# Patient Record
Sex: Male | Born: 1950
Health system: Southern US, Community
[De-identification: ages and names within clinical notes are randomized; demographics above are authoritative.]

## PROBLEM LIST (undated history)

## (undated) DIAGNOSIS — F410 Panic disorder [episodic paroxysmal anxiety] without agoraphobia: Secondary | ICD-10-CM

## (undated) DIAGNOSIS — F1010 Alcohol abuse, uncomplicated: Secondary | ICD-10-CM

## (undated) DIAGNOSIS — J441 Chronic obstructive pulmonary disease with (acute) exacerbation: Secondary | ICD-10-CM

## (undated) DIAGNOSIS — I219 Acute myocardial infarction, unspecified: Secondary | ICD-10-CM

## (undated) DIAGNOSIS — Z720 Tobacco use: Secondary | ICD-10-CM

## (undated) DIAGNOSIS — I10 Essential (primary) hypertension: Secondary | ICD-10-CM

## (undated) DIAGNOSIS — E785 Hyperlipidemia, unspecified: Secondary | ICD-10-CM

## (undated) DIAGNOSIS — I2510 Atherosclerotic heart disease of native coronary artery without angina pectoris: Secondary | ICD-10-CM

## (undated) DIAGNOSIS — I739 Peripheral vascular disease, unspecified: Secondary | ICD-10-CM

## (undated) HISTORY — PX: TONSILLECTOMY: SUR1361

---

## 2006-03-12 ENCOUNTER — Ambulatory Visit: Payer: Self-pay | Admitting: Cardiology

## 2006-03-12 ENCOUNTER — Encounter: Payer: Self-pay | Admitting: Emergency Medicine

## 2006-03-12 ENCOUNTER — Inpatient Hospital Stay (HOSPITAL_COMMUNITY): Admission: AD | Admit: 2006-03-12 | Discharge: 2006-03-16 | Payer: Self-pay | Admitting: Cardiology

## 2006-04-10 ENCOUNTER — Ambulatory Visit: Payer: Self-pay | Admitting: Cardiology

## 2006-09-17 DIAGNOSIS — I219 Acute myocardial infarction, unspecified: Secondary | ICD-10-CM

## 2006-09-17 HISTORY — DX: Acute myocardial infarction, unspecified: I21.9

## 2006-09-17 HISTORY — PX: CORONARY ANGIOPLASTY WITH STENT PLACEMENT: SHX49

## 2015-03-23 ENCOUNTER — Encounter (HOSPITAL_COMMUNITY): Payer: Self-pay | Admitting: Cardiology

## 2015-03-23 ENCOUNTER — Inpatient Hospital Stay (HOSPITAL_COMMUNITY)
Admission: EM | Admit: 2015-03-23 | Discharge: 2015-03-27 | DRG: 981 | Disposition: A | Discharge status: Home / Self Care | Payer: Self-pay | Attending: Family Medicine | Admitting: Family Medicine

## 2015-03-23 ENCOUNTER — Emergency Department (HOSPITAL_COMMUNITY): Payer: Self-pay

## 2015-03-23 ENCOUNTER — Inpatient Hospital Stay (HOSPITAL_COMMUNITY): Payer: Self-pay

## 2015-03-23 DIAGNOSIS — I712 Thoracic aortic aneurysm, without rupture: Secondary | ICD-10-CM | POA: Diagnosis present

## 2015-03-23 DIAGNOSIS — E785 Hyperlipidemia, unspecified: Secondary | ICD-10-CM | POA: Diagnosis present

## 2015-03-23 DIAGNOSIS — Z87891 Personal history of nicotine dependence: Secondary | ICD-10-CM

## 2015-03-23 DIAGNOSIS — R778 Other specified abnormalities of plasma proteins: Secondary | ICD-10-CM | POA: Insufficient documentation

## 2015-03-23 DIAGNOSIS — F17210 Nicotine dependence, cigarettes, uncomplicated: Secondary | ICD-10-CM | POA: Diagnosis present

## 2015-03-23 DIAGNOSIS — N189 Chronic kidney disease, unspecified: Secondary | ICD-10-CM | POA: Diagnosis present

## 2015-03-23 DIAGNOSIS — I480 Paroxysmal atrial fibrillation: Secondary | ICD-10-CM | POA: Diagnosis not present

## 2015-03-23 DIAGNOSIS — I249 Acute ischemic heart disease, unspecified: Secondary | ICD-10-CM

## 2015-03-23 DIAGNOSIS — R7989 Other specified abnormal findings of blood chemistry: Secondary | ICD-10-CM

## 2015-03-23 DIAGNOSIS — R911 Solitary pulmonary nodule: Secondary | ICD-10-CM

## 2015-03-23 DIAGNOSIS — R739 Hyperglycemia, unspecified: Secondary | ICD-10-CM | POA: Diagnosis present

## 2015-03-23 DIAGNOSIS — Z955 Presence of coronary angioplasty implant and graft: Secondary | ICD-10-CM

## 2015-03-23 DIAGNOSIS — Z9861 Coronary angioplasty status: Secondary | ICD-10-CM

## 2015-03-23 DIAGNOSIS — J189 Pneumonia, unspecified organism: Secondary | ICD-10-CM

## 2015-03-23 DIAGNOSIS — R0602 Shortness of breath: Secondary | ICD-10-CM | POA: Insufficient documentation

## 2015-03-23 DIAGNOSIS — R0902 Hypoxemia: Secondary | ICD-10-CM | POA: Insufficient documentation

## 2015-03-23 DIAGNOSIS — J9601 Acute respiratory failure with hypoxia: Secondary | ICD-10-CM | POA: Diagnosis present

## 2015-03-23 DIAGNOSIS — T380X5A Adverse effect of glucocorticoids and synthetic analogues, initial encounter: Secondary | ICD-10-CM | POA: Diagnosis present

## 2015-03-23 DIAGNOSIS — F1010 Alcohol abuse, uncomplicated: Secondary | ICD-10-CM

## 2015-03-23 DIAGNOSIS — I214 Non-ST elevation (NSTEMI) myocardial infarction: Secondary | ICD-10-CM

## 2015-03-23 DIAGNOSIS — J441 Chronic obstructive pulmonary disease with (acute) exacerbation: Principal | ICD-10-CM

## 2015-03-23 DIAGNOSIS — Z720 Tobacco use: Secondary | ICD-10-CM

## 2015-03-23 DIAGNOSIS — I2510 Atherosclerotic heart disease of native coronary artery without angina pectoris: Secondary | ICD-10-CM

## 2015-03-23 DIAGNOSIS — I129 Hypertensive chronic kidney disease with stage 1 through stage 4 chronic kidney disease, or unspecified chronic kidney disease: Secondary | ICD-10-CM | POA: Diagnosis present

## 2015-03-23 DIAGNOSIS — N179 Acute kidney failure, unspecified: Secondary | ICD-10-CM | POA: Diagnosis present

## 2015-03-23 DIAGNOSIS — D3502 Benign neoplasm of left adrenal gland: Secondary | ICD-10-CM | POA: Diagnosis present

## 2015-03-23 DIAGNOSIS — F1020 Alcohol dependence, uncomplicated: Secondary | ICD-10-CM | POA: Diagnosis present

## 2015-03-23 DIAGNOSIS — R079 Chest pain, unspecified: Secondary | ICD-10-CM

## 2015-03-23 HISTORY — DX: Hyperlipidemia, unspecified: E78.5

## 2015-03-23 HISTORY — DX: Atherosclerotic heart disease of native coronary artery without angina pectoris: I25.10

## 2015-03-23 HISTORY — DX: Panic disorder (episodic paroxysmal anxiety): F41.0

## 2015-03-23 HISTORY — DX: Tobacco use: Z72.0

## 2015-03-23 HISTORY — DX: Essential (primary) hypertension: I10

## 2015-03-23 HISTORY — DX: Alcohol abuse, uncomplicated: F10.10

## 2015-03-23 HISTORY — DX: Chronic obstructive pulmonary disease with (acute) exacerbation: J44.1

## 2015-03-23 HISTORY — DX: Acute myocardial infarction, unspecified: I21.9

## 2015-03-23 LAB — GLUCOSE, CAPILLARY
Glucose-Capillary: 142 mg/dL — ABNORMAL HIGH (ref 65–99)
Glucose-Capillary: 159 mg/dL — ABNORMAL HIGH (ref 65–99)

## 2015-03-23 LAB — BASIC METABOLIC PANEL
ANION GAP: 13 (ref 5–15)
BUN: 19 mg/dL (ref 6–20)
CALCIUM: 9.6 mg/dL (ref 8.9–10.3)
CO2: 32 mmol/L (ref 22–32)
CREATININE: 1.56 mg/dL — AB (ref 0.61–1.24)
Chloride: 92 mmol/L — ABNORMAL LOW (ref 101–111)
GFR calc non Af Amer: 46 mL/min — ABNORMAL LOW (ref >60)
GFR, EST AFRICAN AMERICAN: 53 mL/min — AB (ref >60)
Glucose, Bld: 163 mg/dL — ABNORMAL HIGH (ref 65–99)
POTASSIUM: 4.2 mmol/L (ref 3.5–5.1)
Sodium: 137 mmol/L (ref 135–145)

## 2015-03-23 LAB — LIPID PANEL
Cholesterol: 142 mg/dL (ref 0–200)
HDL: 32 mg/dL — ABNORMAL LOW (ref >40)
LDL Cholesterol: 87 mg/dL (ref 0–99)
Total CHOL/HDL Ratio: 4.4 RATIO
Triglycerides: 114 mg/dL (ref <150)
VLDL: 23 mg/dL (ref 0–40)

## 2015-03-23 LAB — HEPATIC FUNCTION PANEL
ALBUMIN: 3.1 g/dL — AB (ref 3.5–5.0)
ALT: 20 U/L (ref 17–63)
AST: 23 U/L (ref 15–41)
Alkaline Phosphatase: 75 U/L (ref 38–126)
BILIRUBIN TOTAL: 0.6 mg/dL (ref 0.3–1.2)
Bilirubin, Direct: 0.2 mg/dL (ref 0.1–0.5)
Indirect Bilirubin: 0.4 mg/dL (ref 0.3–0.9)
Total Protein: 7.7 g/dL (ref 6.5–8.1)

## 2015-03-23 LAB — I-STAT TROPONIN, ED: TROPONIN I, POC: 0.55 ng/mL — AB (ref 0.00–0.08)

## 2015-03-23 LAB — TSH: TSH: 0.928 u[IU]/mL (ref 0.350–4.500)

## 2015-03-23 LAB — CBC
HEMATOCRIT: 48 % (ref 39.0–52.0)
HEMOGLOBIN: 15.7 g/dL (ref 13.0–17.0)
MCH: 33.7 pg (ref 26.0–34.0)
MCHC: 32.7 g/dL (ref 30.0–36.0)
MCV: 103 fL — AB (ref 78.0–100.0)
Platelets: 265 10*3/uL (ref 150–400)
RBC: 4.66 MIL/uL (ref 4.22–5.81)
RDW: 13 % (ref 11.5–15.5)
WBC: 13.8 10*3/uL — ABNORMAL HIGH (ref 4.0–10.5)

## 2015-03-23 LAB — TROPONIN I
Troponin I: 0.52 ng/mL (ref <0.031)
Troponin I: 0.43 ng/mL — ABNORMAL HIGH (ref <0.031)

## 2015-03-23 LAB — HEPARIN LEVEL (UNFRACTIONATED): Heparin Unfractionated: 0.21 IU/mL — ABNORMAL LOW (ref 0.30–0.70)

## 2015-03-23 LAB — T4, FREE: Free T4: 1.37 ng/dL — ABNORMAL HIGH (ref 0.61–1.12)

## 2015-03-23 LAB — BRAIN NATRIURETIC PEPTIDE: B NATRIURETIC PEPTIDE 5: 495.2 pg/mL — AB (ref 0.0–100.0)

## 2015-03-23 MED ORDER — ONDANSETRON HCL 4 MG PO TABS: 4.0000 mg | ORAL_TABLET | Freq: Four times a day (QID) | ORAL | Status: DC | PRN

## 2015-03-23 MED ORDER — ASPIRIN 325 MG PO TABS
325.0000 mg | ORAL_TABLET | Freq: Every day | ORAL | Status: DC
  Administered 2015-03-24: 325 mg via ORAL
  Filled 2015-03-23: qty 1

## 2015-03-23 MED ORDER — ADULT MULTIVITAMIN W/MINERALS CH
1.0000 | ORAL_TABLET | Freq: Every day | ORAL | Status: DC
  Administered 2015-03-23 – 2015-03-27 (×5): 1 via ORAL
  Filled 2015-03-23 (×5): qty 1

## 2015-03-23 MED ORDER — HEPARIN (PORCINE) IN NACL 100-0.45 UNIT/ML-% IJ SOLN
1500.0000 [IU]/h | INTRAMUSCULAR | Status: DC
  Administered 2015-03-23: 1100 [IU]/h via INTRAVENOUS
  Administered 2015-03-24: 1500 [IU]/h via INTRAVENOUS
  Administered 2015-03-24: 1400 [IU]/h via INTRAVENOUS
  Filled 2015-03-23 (×3): qty 250

## 2015-03-23 MED ORDER — FUROSEMIDE 10 MG/ML IJ SOLN
40.0000 mg | Freq: Once | INTRAMUSCULAR | Status: DC
  Filled 2015-03-23: qty 4

## 2015-03-23 MED ORDER — ENOXAPARIN SODIUM 40 MG/0.4ML ~~LOC~~ SOLN: 40.0000 mg | SUBCUTANEOUS | Status: DC

## 2015-03-23 MED ORDER — METHYLPREDNISOLONE SODIUM SUCC 125 MG IJ SOLR
125.0000 mg | Freq: Once | INTRAMUSCULAR | Status: AC
  Administered 2015-03-23: 125 mg via INTRAVENOUS
  Filled 2015-03-23: qty 2

## 2015-03-23 MED ORDER — ASPIRIN EC 81 MG PO TBEC
81.0000 mg | DELAYED_RELEASE_TABLET | Freq: Every day | ORAL | Status: DC
Start: 2015-03-24 — End: 2015-03-23

## 2015-03-23 MED ORDER — LORAZEPAM 2 MG/ML IJ SOLN: 1.0000 mg | Freq: Four times a day (QID) | INTRAMUSCULAR | Status: DC | PRN

## 2015-03-23 MED ORDER — FOLIC ACID 1 MG PO TABS
1.0000 mg | ORAL_TABLET | Freq: Every day | ORAL | Status: DC
  Administered 2015-03-23 – 2015-03-27 (×5): 1 mg via ORAL
  Filled 2015-03-23 (×5): qty 1

## 2015-03-23 MED ORDER — ACETAMINOPHEN 325 MG PO TABS: 650.0000 mg | ORAL_TABLET | Freq: Four times a day (QID) | ORAL | Status: DC | PRN

## 2015-03-23 MED ORDER — LORAZEPAM 1 MG PO TABS
1.0000 mg | ORAL_TABLET | Freq: Four times a day (QID) | ORAL | Status: DC | PRN
  Administered 2015-03-24 (×2): 1 mg via ORAL
  Filled 2015-03-23 (×2): qty 1

## 2015-03-23 MED ORDER — HEPARIN BOLUS VIA INFUSION
4000.0000 [IU] | Freq: Once | INTRAVENOUS | Status: AC
  Administered 2015-03-23: 4000 [IU] via INTRAVENOUS
  Filled 2015-03-23: qty 4000

## 2015-03-23 MED ORDER — INSULIN ASPART 100 UNIT/ML ~~LOC~~ SOLN
0.0000 [IU] | Freq: Three times a day (TID) | SUBCUTANEOUS | Status: DC
  Administered 2015-03-23: 1 [IU] via SUBCUTANEOUS
  Administered 2015-03-24: 2 [IU] via SUBCUTANEOUS
  Administered 2015-03-24: 1 [IU] via SUBCUTANEOUS
  Administered 2015-03-25: 2 [IU] via SUBCUTANEOUS
  Administered 2015-03-26: 1 [IU] via SUBCUTANEOUS
  Administered 2015-03-26 (×2): 2 [IU] via SUBCUTANEOUS
  Administered 2015-03-27: 1 [IU] via SUBCUTANEOUS

## 2015-03-23 MED ORDER — NICOTINE 21 MG/24HR TD PT24
21.0000 mg | MEDICATED_PATCH | Freq: Every day | TRANSDERMAL | Status: DC
  Administered 2015-03-23 – 2015-03-27 (×5): 21 mg via TRANSDERMAL
  Filled 2015-03-23 (×5): qty 1

## 2015-03-23 MED ORDER — FAMOTIDINE 20 MG PO TABS
20.0000 mg | ORAL_TABLET | Freq: Two times a day (BID) | ORAL | Status: DC | PRN
  Administered 2015-03-23 – 2015-03-25 (×2): 20 mg via ORAL
  Filled 2015-03-23 (×3): qty 1

## 2015-03-23 MED ORDER — LEVOFLOXACIN 500 MG PO TABS
500.0000 mg | ORAL_TABLET | Freq: Every day | ORAL | Status: DC
  Administered 2015-03-23: 500 mg via ORAL
  Filled 2015-03-23 (×2): qty 1

## 2015-03-23 MED ORDER — SODIUM CHLORIDE 0.9 % IV SOLN
INTRAVENOUS | Status: DC
  Administered 2015-03-23 (×2): via INTRAVENOUS
  Filled 2015-03-23: qty 1000

## 2015-03-23 MED ORDER — SODIUM CHLORIDE 0.9 % IJ SOLN
3.0000 mL | Freq: Two times a day (BID) | INTRAMUSCULAR | Status: DC
  Administered 2015-03-25: 22:00:00 3 mL via INTRAVENOUS

## 2015-03-23 MED ORDER — ACETAMINOPHEN 650 MG RE SUPP: 650.0000 mg | Freq: Four times a day (QID) | RECTAL | Status: DC | PRN

## 2015-03-23 MED ORDER — ASPIRIN 81 MG PO CHEW
324.0000 mg | CHEWABLE_TABLET | Freq: Once | ORAL | Status: AC
  Administered 2015-03-23: 324 mg via ORAL
  Filled 2015-03-23: qty 4

## 2015-03-23 MED ORDER — CETYLPYRIDINIUM CHLORIDE 0.05 % MT LIQD
7.0000 mL | Freq: Two times a day (BID) | OROMUCOSAL | Status: DC
  Administered 2015-03-23 – 2015-03-27 (×7): 7 mL via OROMUCOSAL

## 2015-03-23 MED ORDER — ONDANSETRON HCL 4 MG/2ML IJ SOLN
4.0000 mg | Freq: Four times a day (QID) | INTRAMUSCULAR | Status: DC | PRN
  Filled 2015-03-23: qty 2

## 2015-03-23 MED ORDER — IPRATROPIUM BROMIDE 0.02 % IN SOLN
0.5000 mg | Freq: Once | RESPIRATORY_TRACT | Status: AC
  Administered 2015-03-23: 0.5 mg via RESPIRATORY_TRACT
  Filled 2015-03-23: qty 2.5

## 2015-03-23 MED ORDER — VITAMIN B-1 100 MG PO TABS
100.0000 mg | ORAL_TABLET | Freq: Every day | ORAL | Status: DC
  Administered 2015-03-23 – 2015-03-27 (×5): 100 mg via ORAL
  Filled 2015-03-23 (×5): qty 1

## 2015-03-23 MED ORDER — THIAMINE HCL 100 MG/ML IJ SOLN
100.0000 mg | Freq: Every day | INTRAMUSCULAR | Status: DC
  Filled 2015-03-23: qty 1

## 2015-03-23 MED ORDER — IOHEXOL 350 MG/ML SOLN
80.0000 mL | Freq: Once | INTRAVENOUS | Status: AC | PRN
  Administered 2015-03-23: 100 mL via INTRAVENOUS

## 2015-03-23 MED ORDER — ALBUTEROL SULFATE (2.5 MG/3ML) 0.083% IN NEBU
5.0000 mg | INHALATION_SOLUTION | Freq: Once | RESPIRATORY_TRACT | Status: AC
  Administered 2015-03-23: 5 mg via RESPIRATORY_TRACT
  Filled 2015-03-23: qty 6

## 2015-03-23 MED ORDER — HYDRALAZINE HCL 20 MG/ML IJ SOLN
5.0000 mg | Freq: Four times a day (QID) | INTRAMUSCULAR | Status: DC | PRN
  Administered 2015-03-23 – 2015-03-24 (×3): 5 mg via INTRAVENOUS
  Filled 2015-03-23 (×3): qty 1

## 2015-03-23 MED ORDER — METHYLPREDNISOLONE SODIUM SUCC 125 MG IJ SOLR
80.0000 mg | Freq: Four times a day (QID) | INTRAMUSCULAR | Status: DC
  Administered 2015-03-23 – 2015-03-24 (×3): 80 mg via INTRAVENOUS
  Filled 2015-03-23 (×3): qty 2

## 2015-03-23 NOTE — ED Provider Notes (Addendum)
CSN: [id number]     Arrival date & time 03/23/15  1236 History   First MD Initiated Contact with Patient 03/23/15 1318     Chief Complaint  Patient presents with  . Chest Pain     (Consider location/radiation/quality/duration/timing/severity/associated sxs/prior Treatment) HPI Comments: Patient is a 64 year old male with past medical history of coronary artery disease with stent 8 years ago. He has not seen a doctor since that time due to financial reasons. He presents to the emergency department today complaining of a long-standing history of shortness of breath which has worsened over the past 3 days. He reports chest congestion and occasional productive cough. He denies any fevers or chills. He denies any radiation of his discomfort to his arm or jaw. He denies any diaphoresis. He does smoke upwards of one pack per day and has done so for many years.  Patient is a 64 y.o. male presenting with chest pain. The history is provided by the patient.  Chest Pain Pain location:  Substernal area Pain quality: tightness   Pain radiates to:  Does not radiate Pain radiates to the back: no   Pain severity:  Moderate Onset quality:  Gradual Duration:  3 days Timing:  Constant Progression:  Worsening Chronicity:  New Context: breathing   Relieved by:  Nothing Worsened by:  Nothing tried Ineffective treatments:  None tried Associated symptoms: cough   Associated symptoms: no fever     History reviewed. No pertinent past medical history. Past Surgical History  Procedure Laterality Date  . Coronary angioplasty with stent placement     History reviewed. No pertinent family history. History  Substance Use Topics  . Smoking status: Current Every Day Smoker -- 1.00 packs/day    Types: Cigarettes  . Smokeless tobacco: Not on file  . Alcohol Use: Yes     Comment: few beers every night    Review of Systems  Constitutional: Negative for fever.  Respiratory: Positive for cough.    Cardiovascular: Positive for chest pain.  All other systems reviewed and are negative.     Allergies  Review of patient's allergies indicates no known allergies.  Home Medications   Prior to Admission medications   Not on File   BP 159/92 mmHg  Pulse 121  Temp(Src) 97.5 F (36.4 C) (Oral)  Resp 22  Wt 185 lb (83.915 kg)  SpO2 85% Physical Exam  Constitutional: He is oriented to person, place, and time. He appears well-developed and well-nourished. No distress.  HENT:  Head: Normocephalic and atraumatic.  Neck: Normal range of motion. Neck supple.  Cardiovascular: Normal rate, regular rhythm and normal heart sounds.   No murmur heard. Pulmonary/Chest: Effort normal and breath sounds normal. No respiratory distress.  There are expiratory rhonchi present.  Abdominal: Soft. Bowel sounds are normal. He exhibits no distension. There is no tenderness.  Musculoskeletal: Normal range of motion. He exhibits no edema.  Lymphadenopathy:    He has no cervical adenopathy.  Neurological: He is alert and oriented to person, place, and time.  Skin: Skin is warm and dry. He is not diaphoretic.  Nursing note and vitals reviewed.   ED Course  Procedures (including critical care time) Labs Review Labs Reviewed  BASIC METABOLIC PANEL  CBC  BRAIN NATRIURETIC PEPTIDE  I-STAT [place], ED    Imaging Review Dg Chest 2 View  03/23/2015   CLINICAL DATA:  Left-sided chest pain for 1 week, more severe today  EXAM: CHEST  2 VIEW  COMPARISON:  03/13/2006  FINDINGS: The heart size and vascular pattern are normal. There is no infiltrate consolidation or edema. There is a 1.3 cm nodular opacity over the left lateral costophrenic angle which is not seen on the prior study.  IMPRESSION: Recommend CT thorax for possible pulmonary nodule.  These results will be called to the ordering clinician or representative by the Radiologist Assistant, and communication documented in the PACS or zVision Dashboard.    Electronically Signed   By: Skipper Cliche M.D.   On: 03/23/2015 13:34     EKG Interpretation   Date/Time:  Wednesday March 23 2015 12:41:33 EDT Ventricular Rate:  120 PR Interval:  156 QRS Duration: 90 QT Interval:  320 QTC Calculation: 452 R Axis:   126 Text Interpretation:  Sinus tachycardia Biatrial enlargement Indeterminate  axis Pulmonary disease pattern Inferior infarct , age undetermined  Abnormal ECG Confirmed by Shealyn Sean  MD, Ariell Gunnels ([id number]) on 03/23/2015 3:09:04 PM      MDM   Final diagnoses:  None    Patient presents here with complaints of chest pain for the past couple of days. He has had angioplasty in the past approximately 8 years ago. The patient has had no follow-up and is taking no medications. His workup reveals no acute changes on his EKG, but he is found to have an elevated troponin of 0.55. His oxygen saturations are somewhat low and his BNP is elevated. He will be given Lasix for this. I have consulted cardiology who will evaluate the patient and determine the final disposition.  CRITICAL CARE Performed by: Veryl Speak Total critical care time: 30 minutes Critical care time was exclusive of separately billable procedures and treating other patients. Critical care was necessary to treat or prevent imminent or life-threatening deterioration. Critical care was time spent personally by me on the following activities: development of treatment plan with patient and/or surrogate as well as nursing, discussions with consultants, evaluation of patient's response to treatment, examination of patient, obtaining history from patient or surrogate, ordering and performing treatments and interventions, ordering and review of laboratory studies, ordering and review of radiographic studies, pulse oximetry and re-evaluation of patient's condition.   Veryl Speak, MD 03/23/15 [place], MD 03/23/15 [phone]

## 2015-03-23 NOTE — ED Notes (Signed)
Report called to [place], Therapist, sports on 3W

## 2015-03-23 NOTE — Progress Notes (Signed)
ANTICOAGULATION CONSULT NOTE - Follow Up Consult  Pharmacy Consult for Heparin  Indication: chest pain/ACS  No Known Allergies  Patient Measurements: Height: 6' (182.9 cm) Weight: 177 lb 11.2 oz (80.604 kg) IBW/kg (Calculated) : 77.6  Vital Signs: Temp: 97.9 F (36.6 C) (07/06 1746) Temp Source: Oral (07/06 1746) BP: 162/120 mmHg (07/06 1746) Pulse Rate: 110 (07/06 1600)  Labs:  Recent Labs  03/23/15 1350 03/23/15 1620 03/23/15 2208  HGB 15.7  --   --   HCT 48.0  --   --   PLT 265  --   --   HEPARINUNFRC  --   --  0.21*  CREATININE 1.56*  --   --   TROPONINI  --  0.52*  --     Estimated Creatinine Clearance: 53.2 mL/min (by C-G formula based on Cr of 1.56).  Assessment: Sub-therapeutic heparin level, no issues per RN.   Goal of Therapy:  Heparin level 0.3-0.7 units/ml Monitor platelets by anticoagulation protocol: Yes   Plan:  -Increase heparin to 1400 units/hr -0800 HL -Daily CBC/HL -Monitor for bleeding  Narda Bonds 03/23/2015,11:09 PM

## 2015-03-23 NOTE — Consult Note (Signed)
Cardiology Consultation Note  Patient ID: Omar [facility], MRN: [id number], DOB/AGE: Feb 02, 1951 64 y.o. Admit date: 03/23/2015   Date of Consult: 03/23/2015 Primary Physician: No primary care provider on file. Primary Cardiologist: Unknown - remotely seen in 2008, no records available from that time and no recent follwup. His stent card indicates Dr. Albertine Singh did the stent and Dr. Domenic Singh admitted him. (But Dr. Domenic Singh is only seeing [facility] patients now.)  Chief Complaint: cough Reason for Consultation: chest pain  HPI: Omar Singh is a 64 y/o M with history of CAD s/p multi-link vision stent to the mid RCA in 2008 (per stent card), seizure disorder (last one 3 years ago), alcohol abuse, ongoing tobacco abuse (45+ years), HTN, HLD who presented to [facility] today with progressive cough and dyspnea for the last several days. He has not followed with a cardiologist, citing financial reasons. He is not on any cardiac medications. He has had a cough for 4+ years but says over the last 48 hours it has gotten increasingly worse, associated with dyspnea, wheezing, and chest congestion. Last night it kept him up all night long and he began to develop chest discomfort whenever he coughed. He denies any chest discomfort occuring outside of the cough. He has not had any exertional chest discomfort. No jaw pain. No palpitations, syncope, BRBPR, melena, hematemesis, weight gain, weight loss, orthopnea, PND. He drinks 6-8 beers nightly but stopped drinking 6 days ago because he has just felt so poorly. Since that time he has felt somewhat sweaty. Workup in the ER reveals Cr 1.56, BNP 495, troponin 0.55, WBC 13.8, MCG 103, glucose 163, CXR showing a 1.3 cm nodular opacity not seen on prior study, recommending CT chest - normal vascular pattern and no consolidation or edema. He received albuterol neb, solu-medrol, atrovent in the ER with no significant improvement in dyspnea. O2 sat did dip down to 85% on  RA. Cough was originally clear, then brown sputum. No hemoptysis. No sick contacts. HR 120s in the ER. Currently satting 91-92% on 2L.   Past Medical History  Diagnosis Date  . CAD (coronary artery disease)     a. s/p multi-link vision stent to the mid RCA in 2007 at [facility].  . Seizure disorder   . Alcohol abuse   . Tobacco abuse   . Hypertension   . Hyperlipidemia       Most Recent Cardiac Studies: None available   Surgical History:  Past Surgical History  Procedure Laterality Date  . Coronary angioplasty with stent placement       Home Meds: Prior to Admission medications   Medication Sig Start Date End Date Taking? Authorizing Provider  diphenhydramine-acetaminophen (TYLENOL PM) 25-500 MG TABS Take 3 tablets by mouth at bedtime as needed (sleep).   Yes Historical Provider, MD    Inpatient Medications:     Allergies: No Known Allergies  History   Social History  . Marital Status: Single    Spouse Name: N/A  . Number of Children: N/A  . Years of Education: N/A   Occupational History  . Not on file.   Social History Main Topics  . Smoking status: Current Every Day Smoker -- 1.00 packs/day    Types: Cigarettes  . Smokeless tobacco: Not on file     Comment: 45+ years  . Alcohol Use: 0.0 oz/week    0 Standard drinks or equivalent per week     Comment: 6-8 beers nightly  . Drug Use: No  .  Sexual Activity: Not on file   Other Topics Concern  . Not on file   Social History Narrative  . No narrative on file     Family History  Problem Relation Age of Onset  . CVA      Grandparents had strokes     Review of Systems: General: negative for chills, fever, weight changes Cardiovascular: see above Dermatological: negative for rash Respiratory: negative for cough or wheezing Urologic: negative for hematuria Abdominal: negative for nausea, vomiting, diarrhea, bright red blood per rectum, melena, or hematemesis Neurologic: negative for visual changes,  syncope, or dizziness All other systems reviewed and are otherwise negative except as noted above.  Labs:  Lab Results  Component Value Date   WBC 13.8* 03/23/2015   HGB 15.7 03/23/2015   HCT 48.0 03/23/2015   MCV 103.0* 03/23/2015   PLT 265 03/23/2015     Recent Labs Lab 03/23/15 1350  NA 137  K 4.2  CL 92*  CO2 32  BUN 19  CREATININE 1.56*  CALCIUM 9.6  GLUCOSE 163*   Radiology/Studies:  Dg Chest 2 View  03/23/2015   CLINICAL DATA:  Left-sided chest pain for 1 week, more severe today  EXAM: CHEST  2 VIEW  COMPARISON:  03/13/2006  FINDINGS: The heart size and vascular pattern are normal. There is no infiltrate consolidation or edema. There is a 1.3 cm nodular opacity over the left lateral costophrenic angle which is not seen on the prior study.  IMPRESSION: Recommend CT thorax for possible pulmonary nodule.  These results will be called to the ordering clinician or representative by the Radiologist Assistant, and communication documented in the PACS or zVision Dashboard.   Electronically Signed   By: [facility] M.D.   On: 03/23/2015 13:34    Wt Readings from Last 3 Encounters:  03/23/15 185 lb (83.915 kg)   EKG: sinus tachycardia 120bpm with biatrial enlargement, pulmonary disease pattern with RSR/S1 in lead I, Q waves inferiorly, no acute ST changes  Physical Exam: Blood pressure 159/92, pulse 121, temperature 97.5 F (36.4 C), temperature source Oral, resp. rate 22, weight 185 lb (83.915 kg), SpO2 85 %. General: Well developed, well nourished WM with mild-moderately increased respiratory effort Head: Normocephalic, atraumatic, sclera non-icteric, no xanthomas, nares are without discharge.  Neck: Negative for carotid bruits. JVD not elevated. Lungs: Diffusely rhonchorous and wheezy throughout. No rales.  Heart: Reg rhythm, increased rate, with S1 S2. No murmurs, rubs, or gallops appreciated. Abdomen: Soft, non-tender, non-distended with normoactive bowel sounds. No  hepatomegaly. No rebound/guarding. No obvious abdominal masses. Msk:  Strength and tone appear normal for age. Extremities: No clubbing or cyanosis. No edema.  Distal pedal pulses are 2+ and equal bilaterally. Neuro: Alert and oriented X 3. No facial asymmetry. No focal deficit. Moves all extremities spontaneously. Psych:  Responds to questions appropriately with a normal affect.    Assessment and Plan:   1. Dyspnea and cough with hypoxia - he appears to have an acute COPD exacerbation. CXR does not show a PNA but he is diffusely rhonchorous. Note new 1.3cm pulmonary nodule as well. EDP is proceeding with CT angio to both evaluate the nodule and exclude PE given tachycardia and hypoxia. Would not give Lasix at this time because his CXR does not show any CHF, heart size is normal, and he is not reporting any LEE or orthopnea - do not want to worsen renal function given that he is going for CTA. Recommend internal medicine admission with cardiology  following.  2. CAD s/p prior stenting in 2008 and mildly elevated first troponin, atypical chest pain - suspect troponin is secondary to pulmonary process, but cannot exclude concomitant ACS. Chest pain is very atypical. Only occurs with coughing. EKG more consistent with pulmonary disease pattern. Nevertheless, given history of CAD, will give 324mg  ASA now and continue 81mg  daily. Per discussion with Dr. Mare Ferrari, would add heparin per pharmacy for now until we can see trajectory of troponins. Check lipids and add statin if LFTs are OK given EtOH. Check echo.  4. H/o HTN with elevated BP in the ER, elevated HR - would avoid BB acutely in the setting of decompensated respiratory status. Elevated BP may be manifestation of alcohol withdrawal. Would consider addition of CCB if BP remains elevated but he is presently 130s/80s. Will also check thyroid function.  5. Acute kidney injury of unclear chronicity - will need to be followed.   6. Alcohol abuse with  probable component of withdrawal (stopped drinking 6 days ago) - elevated HR, BP and sweating may be manifestation of alcohol withdrawal. When he was admitted he would benefit from [place]. Check LFTs.  7. 1.3cm pulmonary nodule - ED plans CT to further evaluate.   SignedMelina Copa PA-C 03/23/2015, 3:54 PM Pager: [phone] Patient seen in the emergency room. Agree with assessment and plan as noted above. The patient is an ongoing cigarette smoker, smoking two thirds of a pack a dayHe has a history of heavy alcohol intake although no alcohol for the past 6 days or so because of his severe cough and he has not felt well. His chest pain is only when he coughs. He does not have any objective evidence of congestive heart failure. I agree with holding off on Lasix at this point. He does have mild renal insufficiency. He has known coronary artery disease with a previous stent in 2008 by Dr. Albertine Singh. He has not had any recent cardiology follow-up. Related to his severe cough and respiratory distress he has had intermittent hypoxia here in the emergency room. At this point his mildly elevated troponin is likely secondary to supply demand mismatch. His electrocardiogram does not show any acute ischemic changes. We will employ IV heparin short term until trajectory of his troponins is known.

## 2015-03-23 NOTE — ED Notes (Signed)
Pt states has not been to a doctor in 8 years, has had shortness of breath for "years" with it getting worse in past few days. Has not been able to sleep for past couple nights. Pt's has moist non productive cough, dyspnea with speaking, unable to speak in complete sentences,

## 2015-03-23 NOTE — H&P (Signed)
History and Physical  [facility] XNA:[id number] DOB: 08/08/1951 DOA: 03/23/2015   PCP: No primary care provider on file.  Referring Physician: ED/ Dr. Stark Jock  Chief Complaint: dyspnea  HPI:  64 year old male with a history of MI 8 years ago with RCA stent placement presents with 2 week history of worsening shortness of breath and nonproductive cough. The patient states that he has not seen a physician for nearly 8 years, and he is on no home medications at this time. The patient woke up at 1 AM on the day of admission with increasing shortness of breath, and as a result he came to the emergency department for further evaluation. The patient states that he has been short of breath for the past 4 months, but has worsened in the past 2 weeks to the point that he is unable to lie flat. He states that he has to sleep sitting up in his recliner. He denies any fevers, chills, chest discomfort, nausea, vomiting, diarrhea. Denies any increased abdominal girth or leg edema. Initially, the patient had a cough with some sputum production, but it is mostly nonproductive. He denies any hemoptysis. The patient continues to smoke. He has smoked up to 3 packs per day for the past 40-50 years.    In the emergency department, the patient was noted to be hypoxemic with oxygen saturation of 85% on room air. On 2 L nasal cannula, the patient is satting approximately 92-93%. Chest x-ray is negative for edema or infiltrate but shows a 1.3 cm nodule in the left lower lobe. The patient is afebrile and her serum creatinine is 1.56. WBC is 13.8. EKG shows sinus tachycardia with nonspecific T-wave changes and IRBBB.  BNP= 495 Assessment/Plan: Acute respiratory failure with hypoxemia -Secondary to COPD exacerbation -Solu-Medrol 125 mg IV was given in the emergency department as well as aerosolized albuterol and Atrovent -Continue intravenous Solu-Medrol with aerosolized albuterol and Atrovent -When oxygen for saturation  greater than 92% -Pulmonary hygiene COPD exacerbation -Steroid-induced and nebulizers as discussed above -Presently stable on 2 L -start levofloxacin Elevated troponin -Likely demand ischemia -Cardiology has evaluated the patient and started him on IV heparin pending cycling of his cardiac enzymes -Cycle cardiac enzymes Tobacco abuse -Tobacco cessation discussed -NicoDerm patch Alcohol dependence -Patient drinks at least one 6pk beer daily -CIWA protocol  Renal insufficiency -Patient likely has underlying CKD -No renal baseline to compare -give 1L fluid challenge Elevated blood pressure -Continue to monitor -pt states he was previously told he had HTN -hydralazine prn SBP >180 Hyperglycemia -Check hemoglobin A1c -Start NovoLog sliding scale as I anticipate the patient's CBGs will be elevated on intravenous tear is Pulmonary nodule -EDP has ordered CT of chest      Past Medical History  Diagnosis Date  . CAD (coronary artery disease)     a. s/p multi-link vision stent to the mid RCA in 2007 at [facility].  . Seizure disorder   . Alcohol abuse   . Tobacco abuse   . Hypertension   . Hyperlipidemia    Past Surgical History  Procedure Laterality Date  . Coronary angioplasty with stent placement     Social History:  reports that he has been smoking Cigarettes.  He has been smoking about 1.00 pack per day. He does not have any smokeless tobacco history on file. He reports that he drinks alcohol. He reports that he does not use illicit drugs.   Family History  Problem Relation Age of Onset  .  CVA      Grandparents had strokes     No Known Allergies    Prior to Admission medications   Medication Sig Start Date End Date Taking? Authorizing Provider  diphenhydramine-acetaminophen (TYLENOL PM) 25-500 MG TABS Take 3 tablets by mouth at bedtime as needed (sleep).   Yes Historical Provider, MD    Review of Systems:  Constitutional:  No weight loss, night sweats,  Fevers, chills Head&Eyes: No headache.  No vision loss.  No eye pain or scotoma ENT:  No Difficulty swallowing,Tooth/dental problems,Sore throat,  No ear ache, post nasal drip,  Cardio-vascular:  No chest pain, Orthopnea, PND, swelling in lower extremities,  dizziness, palpitations  GI:  No  abdominal pain, nausea, vomiting, diarrhea, loss of appetite, hematochezia, melena, heartburn, indigestion, Resp:  No coughing up of blood .No wheezing.No chest wall deformity  Skin:  no rash or lesions.  GU:  no dysuria, change in color of urine, no urgency or frequency. No flank pain.  Musculoskeletal:  No joint pain or swelling. No decreased range of motion. No back pain.  Psych:  No change in mood or affect. No depression or anxiety. Neurologic: No headache, no dysesthesia, no focal weakness, no vision loss. No syncope  Physical Exam: Filed Vitals:   03/23/15 1241 03/23/15 1244 03/23/15 1255 03/23/15 1600  BP:  147/110 159/92   Pulse:  82 121   Temp: 97.5 F (36.4 C)     TempSrc: Oral     Resp:  18 22   Height:    6' (1.829 m)  Weight:  83.915 kg (185 lb)    SpO2:  95% 85%    General:  A&O x 3, NAD, nontoxic, pleasant/cooperative Head/Eye: No conjunctival hemorrhage, no icterus, [place]/AT, No nystagmus ENT:  No icterus,  No thrush, good dentition, no pharyngeal exudate Neck:  No masses, no lymphadenpathy, no bruits CV:  RRR, no rub, no gallop, no S3; no JVD Lung:  Diminished breath sounds bilateral. Bilateral expiratory wheeze. Abdomen: soft/NT, +BS, nondistended, no peritoneal signs Ext: No cyanosis, No rashes, No petechiae, No lymphangitis, No edema Neuro: CNII-XII intact, strength 4/5 in bilateral upper and lower extremities, no dysmetria  Labs on Admission:  Basic Metabolic Panel:  Recent Labs Lab 03/23/15 1350  NA 137  K 4.2  CL 92*  CO2 32  GLUCOSE 163*  BUN 19  CREATININE 1.56*  CALCIUM 9.6   Liver Function Tests: No results for input(s): AST, ALT, ALKPHOS,  BILITOT, PROT, ALBUMIN in the last 168 hours. No results for input(s): LIPASE, AMYLASE in the last 168 hours. No results for input(s): AMMONIA in the last 168 hours. CBC:  Recent Labs Lab 03/23/15 1350  WBC 13.8*  HGB 15.7  HCT 48.0  MCV 103.0*  PLT 265   Cardiac Enzymes: No results for input(s): CKTOTAL, CKMB, CKMBINDEX, TROPONINI in the last 168 hours. BNP: Invalid input(s): POCBNP CBG: No results for input(s): GLUCAP in the last 168 hours.  Radiological Exams on Admission: Dg Chest 2 View  03/23/2015   CLINICAL DATA:  Left-sided chest pain for 1 week, more severe today  EXAM: CHEST  2 VIEW  COMPARISON:  03/13/2006  FINDINGS: The heart size and vascular pattern are normal. There is no infiltrate consolidation or edema. There is a 1.3 cm nodular opacity over the left lateral costophrenic angle which is not seen on the prior study.  IMPRESSION: Recommend CT thorax for possible pulmonary nodule.  These results will be called to the ordering clinician or representative  by the Radiologist Assistant, and communication documented in the PACS or zVision Dashboard.   Electronically Signed   By: Skipper Cliche M.D.   On: 03/23/2015 13:34    EKG: Independently reviewed. Sinus tachycardia, nonspecific T-wave changes, IRBBB    Time spent:60 minutes Code Status:  FULL Family Communication:   Wife updated at bedside   Karyss Frese, DO  [facility] Pager [phone]  If 7PM-7AM, please contact night-coverage [email or web address] Password [email or web address] 03/23/2015, 4:25 PM

## 2015-03-23 NOTE — Progress Notes (Signed)
ANTICOAGULATION CONSULT NOTE - Initial Consult  Pharmacy Consult for Heparin Indication: chest pain/ACS  No Known Allergies  Patient Measurements: Height: 6' (182.9 cm) Weight: 185 lb (83.915 kg) IBW/kg (Calculated) : 77.6 Heparin Dosing Weight: 84 kg  Vital Signs: Temp: 97.5 F (36.4 C) (07/06 1241) Temp Source: Oral (07/06 1241) BP: 159/92 mmHg (07/06 1255) Pulse Rate: 121 (07/06 1255)  Labs:  Recent Labs  03/23/15 1350  HGB 15.7  HCT 48.0  PLT 265  CREATININE 1.56*    CrCl cannot be calculated (Unknown ideal weight.).   Medical History: Past Medical History  Diagnosis Date  . CAD (coronary artery disease)     a. s/p multi-link vision stent to the mid RCA in 2007 at [facility].  . Seizure disorder   . Alcohol abuse   . Tobacco abuse   . Hypertension   . Hyperlipidemia     Medications:   (Not in a hospital admission) Scheduled:  . aspirin  324 mg Oral Once  . [START ON 03/24/2015] aspirin EC  81 mg Oral Daily   Infusions:    Assessment: 65yo male with history of CAD presents with CP. Pharmacy is consulted to dose heparin for ACS/chest pain. Hgb wnl, sCr 1.6, BNP 495, Trop 0.55.  Goal of Therapy:  Heparin level 0.3-0.7 units/ml Monitor platelets by anticoagulation protocol: Yes   Plan:  Give 4000 units bolus x 1 Start heparin infusion at 1100 units/hr Check anti-Xa level in 6 hours and daily while on heparin Continue to monitor H&H and platelets  Andrey Cota. Diona Foley, PharmD Clinical Pharmacist Pager [phone] 03/23/2015,4:00 PM

## 2015-03-23 NOTE — ED Notes (Signed)
Pt reports chest pain over the past couple of days. States he has also had a cough and SOB. States the pain has become worse today.

## 2015-03-24 ENCOUNTER — Inpatient Hospital Stay (HOSPITAL_COMMUNITY): Payer: Self-pay

## 2015-03-24 DIAGNOSIS — I2510 Atherosclerotic heart disease of native coronary artery without angina pectoris: Secondary | ICD-10-CM

## 2015-03-24 DIAGNOSIS — J441 Chronic obstructive pulmonary disease with (acute) exacerbation: Principal | ICD-10-CM

## 2015-03-24 DIAGNOSIS — R0600 Dyspnea, unspecified: Secondary | ICD-10-CM

## 2015-03-24 DIAGNOSIS — Z9861 Coronary angioplasty status: Secondary | ICD-10-CM

## 2015-03-24 LAB — LIPID PANEL
Cholesterol: 133 mg/dL (ref 0–200)
HDL: 29 mg/dL — AB (ref >40)
LDL Cholesterol: 87 mg/dL (ref 0–99)
TRIGLYCERIDES: 86 mg/dL (ref <150)
Total CHOL/HDL Ratio: 4.6 RATIO
VLDL: 17 mg/dL (ref 0–40)

## 2015-03-24 LAB — CBC
HEMATOCRIT: 43.9 % (ref 39.0–52.0)
Hemoglobin: 14.1 g/dL (ref 13.0–17.0)
MCH: 32.9 pg (ref 26.0–34.0)
MCHC: 32.1 g/dL (ref 30.0–36.0)
MCV: 102.6 fL — ABNORMAL HIGH (ref 78.0–100.0)
PLATELETS: 273 10*3/uL (ref 150–400)
RBC: 4.28 MIL/uL (ref 4.22–5.81)
RDW: 13.1 % (ref 11.5–15.5)
WBC: 11 10*3/uL — AB (ref 4.0–10.5)

## 2015-03-24 LAB — GLUCOSE, CAPILLARY
GLUCOSE-CAPILLARY: 125 mg/dL — AB (ref 65–99)
GLUCOSE-CAPILLARY: 374 mg/dL — AB (ref 65–99)
Glucose-Capillary: 118 mg/dL — ABNORMAL HIGH (ref 65–99)
Glucose-Capillary: 158 mg/dL — ABNORMAL HIGH (ref 65–99)

## 2015-03-24 LAB — BASIC METABOLIC PANEL
Anion gap: 8 (ref 5–15)
BUN: 18 mg/dL (ref 6–20)
CHLORIDE: 97 mmol/L — AB (ref 101–111)
CO2: 32 mmol/L (ref 22–32)
Calcium: 9.2 mg/dL (ref 8.9–10.3)
Creatinine, Ser: 0.97 mg/dL (ref 0.61–1.24)
GFR calc non Af Amer: >60 mL/min (ref >60)
GLUCOSE: 126 mg/dL — AB (ref 65–99)
Potassium: 5.1 mmol/L (ref 3.5–5.1)
Sodium: 137 mmol/L (ref 135–145)

## 2015-03-24 LAB — TROPONIN I
TROPONIN I: 0.23 ng/mL — AB (ref <0.031)
Troponin I: 0.32 ng/mL — ABNORMAL HIGH (ref <0.031)

## 2015-03-24 LAB — HEPARIN LEVEL (UNFRACTIONATED): Heparin Unfractionated: 0.31 IU/mL (ref 0.30–0.70)

## 2015-03-24 MED ORDER — AMIODARONE LOAD VIA INFUSION
150.0000 mg | Freq: Once | INTRAVENOUS | Status: AC
  Administered 2015-03-24: 150 mg via INTRAVENOUS
  Filled 2015-03-24: qty 83.34

## 2015-03-24 MED ORDER — METHYLPREDNISOLONE SODIUM SUCC 125 MG IJ SOLR
60.0000 mg | Freq: Four times a day (QID) | INTRAMUSCULAR | Status: AC
  Administered 2015-03-24 – 2015-03-25 (×6): 60 mg via INTRAVENOUS
  Filled 2015-03-24 (×6): qty 2

## 2015-03-24 MED ORDER — LEVALBUTEROL HCL 0.63 MG/3ML IN NEBU
0.6300 mg | INHALATION_SOLUTION | Freq: Three times a day (TID) | RESPIRATORY_TRACT | Status: DC | PRN
  Administered 2015-03-26: 0.63 mg via RESPIRATORY_TRACT
  Filled 2015-03-24 (×3): qty 3

## 2015-03-24 MED ORDER — ASPIRIN 81 MG PO CHEW
81.0000 mg | CHEWABLE_TABLET | ORAL | Status: AC
  Administered 2015-03-25: 81 mg via ORAL
  Filled 2015-03-24: qty 1

## 2015-03-24 MED ORDER — SODIUM CHLORIDE 0.9 % IJ SOLN: 3.0000 mL | INTRAMUSCULAR | Status: DC | PRN

## 2015-03-24 MED ORDER — AMLODIPINE BESYLATE 5 MG PO TABS
5.0000 mg | ORAL_TABLET | Freq: Every day | ORAL | Status: DC
  Administered 2015-03-24 – 2015-03-25 (×2): 5 mg via ORAL
  Filled 2015-03-24 (×2): qty 1

## 2015-03-24 MED ORDER — SODIUM CHLORIDE 0.9 % IV SOLN
250.0000 mL | INTRAVENOUS | Status: DC | PRN
Start: 2015-03-24 — End: 2015-03-25

## 2015-03-24 MED ORDER — IPRATROPIUM-ALBUTEROL 0.5-2.5 (3) MG/3ML IN SOLN
3.0000 mL | Freq: Four times a day (QID) | RESPIRATORY_TRACT | Status: DC
  Administered 2015-03-24 – 2015-03-27 (×11): 3 mL via RESPIRATORY_TRACT
  Filled 2015-03-24 (×13): qty 3

## 2015-03-24 MED ORDER — HYDRALAZINE HCL 25 MG PO TABS
50.0000 mg | ORAL_TABLET | Freq: Four times a day (QID) | ORAL | Status: DC
  Administered 2015-03-24 – 2015-03-26 (×6): 50 mg via ORAL
  Filled 2015-03-24: qty 1
  Filled 2015-03-24 (×2): qty 2
  Filled 2015-03-24 (×2): qty 1
  Filled 2015-03-24 (×2): qty 2

## 2015-03-24 MED ORDER — DILTIAZEM HCL 100 MG IV SOLR
5.0000 mg/h | INTRAVENOUS | Status: DC
  Administered 2015-03-24: 5 mg/h via INTRAVENOUS
  Administered 2015-03-24: 10 mg/h via INTRAVENOUS
  Filled 2015-03-24: qty 100

## 2015-03-24 MED ORDER — DEXTROMETHORPHAN POLISTIREX ER 30 MG/5ML PO SUER
30.0000 mg | Freq: Every evening | ORAL | Status: DC | PRN
  Administered 2015-03-24: 30 mg via ORAL
  Filled 2015-03-24 (×3): qty 5

## 2015-03-24 MED ORDER — SODIUM CHLORIDE 0.9 % IJ SOLN: 3.0000 mL | Freq: Two times a day (BID) | INTRAMUSCULAR | Status: DC

## 2015-03-24 MED ORDER — DOXYCYCLINE HYCLATE 100 MG PO TABS
100.0000 mg | ORAL_TABLET | Freq: Two times a day (BID) | ORAL | Status: DC
  Administered 2015-03-24 – 2015-03-27 (×6): 100 mg via ORAL
  Filled 2015-03-24 (×7): qty 1

## 2015-03-24 MED ORDER — ISOSORBIDE DINITRATE 10 MG PO TABS
5.0000 mg | ORAL_TABLET | Freq: Two times a day (BID) | ORAL | Status: DC
  Administered 2015-03-24 – 2015-03-25 (×4): 5 mg via ORAL
  Filled 2015-03-24 (×4): qty 1

## 2015-03-24 MED ORDER — DILTIAZEM LOAD VIA INFUSION
10.0000 mg | Freq: Once | INTRAVENOUS | Status: AC
  Administered 2015-03-24: 10 mg via INTRAVENOUS
  Filled 2015-03-24: qty 10

## 2015-03-24 MED ORDER — BENZONATATE 100 MG PO CAPS
200.0000 mg | ORAL_CAPSULE | Freq: Three times a day (TID) | ORAL | Status: DC | PRN
  Administered 2015-03-24: 200 mg via ORAL
  Filled 2015-03-24: qty 2

## 2015-03-24 MED ORDER — INSULIN ASPART 100 UNIT/ML ~~LOC~~ SOLN
9.0000 [IU] | Freq: Once | SUBCUTANEOUS | Status: AC
  Administered 2015-03-24: 9 [IU] via SUBCUTANEOUS

## 2015-03-24 MED ORDER — AMIODARONE HCL IN DEXTROSE 360-4.14 MG/200ML-% IV SOLN
60.0000 mg/h | INTRAVENOUS | Status: AC
  Administered 2015-03-24 (×2): 60 mg/h via INTRAVENOUS
  Filled 2015-03-24: qty 200

## 2015-03-24 MED ORDER — AMIODARONE HCL IN DEXTROSE 360-4.14 MG/200ML-% IV SOLN
30.0000 mg/h | INTRAVENOUS | Status: DC
  Administered 2015-03-24: 30 mg/h via INTRAVENOUS
  Filled 2015-03-24 (×2): qty 200

## 2015-03-24 MED ORDER — SODIUM CHLORIDE 0.9 % IV SOLN
INTRAVENOUS | Status: DC
  Administered 2015-03-24: 22:00:00 via INTRAVENOUS

## 2015-03-24 NOTE — Hospital Discharge Follow-Up (Signed)
Received call from Jacqlyn Krauss, RN CM to determine if patient meets criteria for Transitional Care Clinic. Patient does not meet criteria for Transitional Care Clinic but hospital follow-up appointment obtained on 04/04/15 at 1615 with Dr. Adrian Blackwater. Appointment time placed on AVS.  Inpatient CM updated.

## 2015-03-24 NOTE — Progress Notes (Deleted)
Patient Name: Omar Singh Date of Encounter: 03/24/2015  Primary Cardiologist: new - stent placed by Dr. Albertine Patricia in 2008 and seen Dr. Laurice Record remotely    Active Problems:   Hypoxia   Shortness of breath   CAD S/P percutaneous coronary angioplasty   Elevated troponin   Acute kidney injury   Pulmonary nodule   Alcohol abuse   Tobacco abuse   Acute respiratory failure with hypoxemia   COPD exacerbation    SUBJECTIVE  Continue to have significant wheezing, respiratory issue has been a recurring problem. States he has not smoked since last Monday. When asked about chest pain, he says he never had "chest pain", what he had was rib pain during cough.  CURRENT MEDS . antiseptic oral rinse  7 mL Mouth Rinse BID  . aspirin  325 mg Oral Daily  . folic acid  1 mg Oral Daily  . insulin aspart  0-9 Units Subcutaneous TID WC  . levofloxacin  500 mg Oral Daily  . methylPREDNISolone (SOLU-MEDROL) injection  80 mg Intravenous Q6H  . multivitamin with minerals  1 tablet Oral Daily  . nicotine  21 mg Transdermal Daily  . sodium chloride  3 mL Intravenous Q12H  . thiamine  100 mg Oral Daily   Or  . thiamine  100 mg Intravenous Daily    OBJECTIVE  Filed Vitals:   03/23/15 1746 03/23/15 1749 03/23/15 2351 03/24/15 0600  BP: 162/120  181/100 201/114  Pulse:   103 102  Temp: 97.9 F (36.6 C)  98.5 F (36.9 C) 97.7 F (36.5 C)  TempSrc: Oral  Oral Oral  Resp: 25  20 20   Height: 6' (1.829 m)     Weight: 177 lb 11.2 oz (80.604 kg)   178 lb 9.2 oz (81 kg)  SpO2: 93% 94% 98% 96%    Intake/Output Summary (Last 24 hours) at 03/24/15 0803 Last data filed at 03/23/15 2351  Gross per 24 hour  Intake      0 ml  Output    350 ml  Net   -350 ml   Filed Weights   03/23/15 1244 03/23/15 1746 03/24/15 0600  Weight: 185 lb (83.915 kg) 177 lb 11.2 oz (80.604 kg) 178 lb 9.2 oz (81 kg)    PHYSICAL EXAM  General: Pleasant, NAD. Neuro: Alert and oriented X 3. Moves all extremities  spontaneously. Psych: Normal affect. HEENT:  Normal  Neck: Supple without bruits or JVD. Lungs:  Resp regular and unlabored. Diffuse wheezing and rhonchi, diminished breath sound.  Heart: RRR no s3, s4, or murmurs. Abdomen: Soft, non-tender, non-distended, BS + x 4.  Extremities: No clubbing, cyanosis or edema. DP/PT/Radials 2+ and equal bilaterally.  Accessory Clinical Findings  CBC  Recent Labs  03/23/15 1350 03/24/15 0505  WBC 13.8* 11.0*  HGB 15.7 14.1  HCT 48.0 43.9  MCV 103.0* 102.6*  PLT 265 517   Basic Metabolic Panel  Recent Labs  03/23/15 1350 03/24/15 0505  NA 137 137  K 4.2 5.1  CL 92* 97*  CO2 32 32  GLUCOSE 163* 126*  BUN 19 18  CREATININE 1.56* 0.97  CALCIUM 9.6 9.2   Liver Function Tests  Recent Labs  03/23/15 1620  AST 23  ALT 20  ALKPHOS 75  BILITOT 0.6  PROT 7.7  ALBUMIN 3.1*   Cardiac Enzymes  Recent Labs  03/23/15 1620 03/23/15 2208 03/24/15 0505  TROPONINI 0.52* 0.43* 0.32*   Fasting Lipid Panel  Recent Labs  03/24/15 0505  CHOL 133  HDL 29*  LDLCALC 87  TRIG 86  CHOLHDL 4.6   Thyroid Function Tests  Recent Labs  03/23/15 1620  TSH 0.928    TELE Sinus tach with HR low 100s    ECG  No new EKG   Radiology/Studies  Dg Chest 2 View  03/23/2015   CLINICAL DATA:  Left-sided chest pain for 1 week, more severe today  EXAM: CHEST  2 VIEW  COMPARISON:  03/13/2006  FINDINGS: The heart size and vascular pattern are normal. There is no infiltrate consolidation or edema. There is a 1.3 cm nodular opacity over the left lateral costophrenic angle which is not seen on the prior study.  IMPRESSION: Recommend CT thorax for possible pulmonary nodule.  These results will be called to the ordering clinician or representative by the Radiologist Assistant, and communication documented in the PACS or zVision Dashboard.   Electronically Signed   By: Skipper Cliche M.D.   On: 03/23/2015 13:34   Ct Angio Chest Pe W/cm &/or Wo  Cm  03/23/2015   CLINICAL DATA:  Chest pain for 4 weeks worse today, cough, shortness of breath, history smoking, hypertension, hyperlipidemia, coronary artery disease post angioplasty  EXAM: CT ANGIOGRAPHY CHEST WITH CONTRAST  TECHNIQUE: Multidetector CT imaging of the chest was performed using the standard protocol during bolus administration of intravenous contrast. Multiplanar CT image reconstructions and MIPs were obtained to evaluate the vascular anatomy.  CONTRAST:  144mL OMNIPAQUE IOHEXOL 350 MG/ML SOLN IV  COMPARISON:  None  FINDINGS: Scattered atherosclerotic calcifications aorta, coronary arteries, and proximal great vessels.  Minimal aneurysmal dilatation of ascending thoracic aorta 4.0 x 4.1 cm image 47.  Scattered respiratory motion artifacts which limit exam.  Dilated central pulmonary arteries question pulmonary arterial hypertension.  Pulmonary arteries well opacified and grossly patent.  No definite evidence of pulmonary embolism.  Few scattered normal sized mediastinal lymph nodes.  Low-attenuation LEFT adrenal nodule 2.3 x 1.6 cm compatible with adenoma.  Remaining visualized upper abdomen unremarkable.  Peribronchial vascular infiltrate identified in both lower lobes RIGHT greater than LEFT, minimally in RIGHT upper lobe, question bronchiolitis consistent with COPD.  Underlying emphysematous changes and central peribronchial thickening.  No segmental consolidation, pleural effusion or pneumothorax.  No acute osseous findings.  Review of the MIP images confirms the above findings.  IMPRESSION: No evidence pulmonary embolism.  COPD changes with peribronchovascular infiltrates in both lower lobes RIGHT greater than LEFT and less in RIGHT upper lobe question bronchiolitis.  Scattered atherosclerotic disease with aneurysmal dilatation of the ascending thoracic aorta, recommendation below.  Recommend annual imaging followup by CTA or MRA. This recommendation follows 2010  ACCF/AHA/AATS/ACR/ASA/SCA/SCAI/SIR/STS/SVM Guidelines for the Diagnosis and Management of Patients with Thoracic Aortic Disease. Circulation. 2010; 121: T625-W389   Electronically Signed   By: Lavonia Dana M.D.   On: 03/23/2015 17:21    ASSESSMENT AND PLAN  1. Progressitve cough and dyspnea 2/2 COPD exacerbation  - continue to have significant dyspnea and cough, recurring issue per pt, tried to quit smoking twice, however eventually resumed smoking. Encouraged him to attempt tobacco cessation again.  2. Atypical chest pain with elevated trop  - no sign of HF on exam, CTA of chest showed no PE, however COPD changes with peribronchovascular infiltrates in both lower lobes R > L question bronchiolities  - serial trop trending down, per pt, chest pain only occur during cough. He has not experienced his typical anginal pain since 2008. Would not expect any further inpatient cardiac workup  unless echo is severely abnormal. Need outpatient cardiology followup given his history.   3. CAD s/p multi-link vision stent to mid RCA in 2008  4. AKI  5. HTN: uncontrolled  6. HLD  7. Tobacco abuse  8. ETOH abuse  9. 1.3 cm pulmonary nodule on CXR, did not see significant nodule mentioned in CTA report 10. L adrenal nodule 2.3 x 1.6cm compatible with adenoma seen on CTA of chest 11. Aneurysmal dilatation of ascending thoracic aorta 4.0 x 4.1 cm  Signed, Almyra Deforest PA-C Pager: [phone]

## 2015-03-24 NOTE — Progress Notes (Signed)
Patient ID: Omar Singh, male   DOB: October 16, 1950, 64 y.o.   MRN: [id number] Asked to see on rounds by Dr Verlon Au Patient examined chart reviewed Concern for poor medical f/u.   SSCP and dyspnea Primary issue is clearly an acute inflammatory/infectious process of lungs on top of COPD Purse lip breathing and abnormal lung exam.  However he is still smoking and had SSCP with positive troponin High likelyhood of recurrent disease  Discussed with patient have arranged cath in am with CM to clarify CAD Would have pulmonary see for bronchiolitis likely benefit from steroids.  One dose lasix to see if this helps clear CXR as BNP mildly elevated Echo pending.     Jenkins Rouge

## 2015-03-24 NOTE — Progress Notes (Signed)
Hospitalist on call paged; pt request cough suppressant medication. Pt fearful of not being able to proceed with heart cath procedure due to non-stop coughing. Will continue to monitor closely and await new orders.

## 2015-03-24 NOTE — Progress Notes (Signed)
  Echocardiogram 2D Echocardiogram has been performed.  Omar Singh 03/24/2015, 10:29 AM

## 2015-03-24 NOTE — Progress Notes (Signed)
ANTICOAGULATION CONSULT NOTE - Follow Up Consult  Pharmacy Consult for Heparin Indication: chest pain/ACS  No Known Allergies  Patient Measurements: Height: 6' (182.9 cm) Weight: 178 lb 9.2 oz (81 kg) IBW/kg (Calculated) : 77.6 Heparin Dosing Weight: 81 kg  Vital Signs: Temp: 97.7 F (36.5 C) (07/07 0600) Temp Source: Oral (07/07 0600) BP: 201/114 mmHg (07/07 0600) Pulse Rate: 102 (07/07 0600)  Labs:  Recent Labs  03/23/15 1350 03/23/15 1620 03/23/15 2208 03/24/15 0505 03/24/15 0758  HGB 15.7  --   --  14.1  --   HCT 48.0  --   --  43.9  --   PLT 265  --   --  273  --   HEPARINUNFRC  --   --  0.21*  --  0.31  CREATININE 1.56*  --   --  0.97  --   TROPONINI  --  0.52* 0.43* 0.32*  --     Estimated Creatinine Clearance: 85.6 mL/min (by C-G formula based on Cr of 0.97).   Medications:  Prescriptions prior to admission  Medication Sig Dispense Refill Last Dose  . diphenhydramine-acetaminophen (TYLENOL PM) 25-500 MG TABS Take 3 tablets by mouth at bedtime as needed (sleep).   03/22/2015 at Unknown time    Assessment: 64yo male with history of CAD presents with CP. Pharmacy is consulted to dose heparin for ACS/chest pain. Daily heparin level 0.31.  Goal of Therapy:  Heparin level 0.3-0.7 units/ml Monitor platelets by anticoagulation protocol: Yes   Plan:  -Increase heparin to 1500 units/hr -Daily CBC/HL, 0800HL -Patient to cath in AM  Myrene Galas, PharmD Pharmacy Resident 03/24/2015,10:19 AM

## 2015-03-24 NOTE — Progress Notes (Signed)
Per tele, patient went into rapid afib, HR sustaining in 160s-180s. 12 lead EKG obtained confirming rhythm. BP elevated 190s/120s. Cardiology PA, Almyra Deforest, paged and made aware. PRN IV hydralazine given as ordered for elevated BP. IV cardizem bolus and gtt started per order. IV heparin still infusing as previously ordered.  Pt complaining of chest pain while heart elevated. Isaac Laud, [place] in room with patient during this time. IV amio also ordered and started per order. HR dropped into 140s-150s after amio bolus. Pt BP down to 140s/90s and currently pain free.  Addendum @ 1645:  Pt currently sleeping.  HR in 120s, BP lowered 120s/80s. IV amio, dilt and heparin still infusing as ordered. Will continue to monitor patient closely.

## 2015-03-24 NOTE — Progress Notes (Addendum)
KOLBEY TEICHERT IOE:[id number] DOB: 1951/08/24 DOA: 03/23/2015 PCP: No PCP Per Patient  Brief narrative: 64 y/o ? prior CAD ? Mid RCA DES stenting 2008, Continued EToH [[i drink 6-8 beers a day], 54 yr-pack h/o smoking, htn, hld, admitted  He has not seen an MD in ~ 6 yrs He is retired, used to be a Dealer He lives with his wife who quit smoking 3 yrs ago after diagnosis of lung Ca   Past medical history-As per Problem list Chart reviewed as below-   Consultants:    Procedures:    Antibiotics:     Subjective   "i feel better" Cites on-off cough 1 year, every other month Nursing informs that patient tachypneaic, ? wob this am No cp No n/v No blurred or double vision    Objective    Interim History:   Telemetry: Tele nsr   Objective: Filed Vitals:   03/23/15 1746 03/23/15 1749 03/23/15 2351 03/24/15 0600  BP: 162/120  181/100 201/114  Pulse:   103 102  Temp: 97.9 F (36.6 C)  98.5 F (36.9 C) 97.7 F (36.5 C)  TempSrc: Oral  Oral Oral  Resp: 25  20 20   Height: 6' (1.829 m)     Weight: 80.604 kg (177 lb 11.2 oz)   81 kg (178 lb 9.2 oz)  SpO2: 93% 94% 98% 96%    Intake/Output Summary (Last 24 hours) at 03/24/15 0827 Last data filed at 03/23/15 2351  Gross per 24 hour  Intake      0 ml  Output    350 ml  Net   -350 ml    Exam:  General: eomi, tired appeareing but pleasant male, visible ? accesory muslce use.  Wheeze can be heard from bedsdie Cardiovascular: no pallor, no ict, s1 s2 tachy slighlty Respiratory: wheezy++, no rales nor rhonchi Abdomen: soft n nd Skin no le edema Neuro intact  Data Reviewed: Basic Metabolic Panel:  Recent Labs Lab 03/23/15 1350 03/24/15 0505  NA 137 137  K 4.2 5.1  CL 92* 97*  CO2 32 32  GLUCOSE 163* 126*  BUN 19 18  CREATININE 1.56* 0.97  CALCIUM 9.6 9.2   Liver Function Tests:  Recent Labs Lab 03/23/15 1620  AST 23  ALT 20  ALKPHOS 75  BILITOT 0.6  PROT 7.7  ALBUMIN 3.1*   No results  for input(s): LIPASE, AMYLASE in the last 168 hours. No results for input(s): AMMONIA in the last 168 hours. CBC:  Recent Labs Lab 03/23/15 1350 03/24/15 0505  WBC 13.8* 11.0*  HGB 15.7 14.1  HCT 48.0 43.9  MCV 103.0* 102.6*  PLT 265 273   Cardiac Enzymes:  Recent Labs Lab 03/23/15 1620 03/23/15 2208 03/24/15 0505  TROPONINI 0.52* 0.43* 0.32*   BNP: Invalid input(s): POCBNP CBG:  Recent Labs Lab 03/23/15 1822 03/23/15 2117 03/24/15 0736  GLUCAP 142* 159* 125*    No results found for this or any previous visit (from the past 240 hour(s)).   Studies:              All Imaging reviewed and is as per above notation   Scheduled Meds: . antiseptic oral rinse  7 mL Mouth Rinse BID  . aspirin  325 mg Oral Daily  . folic acid  1 mg Oral Daily  . insulin aspart  0-9 Units Subcutaneous TID WC  . ipratropium-albuterol  3 mL Nebulization Q6H  . levofloxacin  500 mg Oral Daily  . methylPREDNISolone (SOLU-MEDROL)  injection  60 mg Intravenous 4 times per day  . multivitamin with minerals  1 tablet Oral Daily  . nicotine  21 mg Transdermal Daily  . sodium chloride  3 mL Intravenous Q12H  . thiamine  100 mg Oral Daily   Or  . thiamine  100 mg Intravenous Daily   Continuous Infusions: . heparin 1,400 Units/hr (03/24/15 0630)  . sodium chloride 0.9 % 1,000 mL infusion Stopped (03/24/15 6045)     Assessment/Plan:  1. AECOPD- -Solumedrol 60 q6 for 6 doses -Non-purulnet sputum?  -would still continue po levaquin for COPD exacerbation er ISDA guidelines -Duonebs per RT -will re-visit clinically later today  2.   Elevated troponin with mod suspicion for Angina -rec'd ASA and started don Hep per Cardiology -Cardiology planning cardiac cath given h/o -troponin trending downwards -continue Heparin gtt as per cardiology -appreciate Dr. Johnsie Cancel input  3.         Hld -   ASCVD risk already elevated - High intensitying Lipitor 40 mg daily for 2/2  preventon  4. EToH -stopped drinking 6 days ago to cardiology-stopped drinking 9 days ago when i asked him  -CIWA is currently 1 -monitor on tele  5.  Htn urgency -add hydralazine scheduled po 50 q6 -add isordil 5 bid in addition -defer diuresis for now to cardiology -cath should tell us hemodynamics  6.   Smoker   -needs Nicotine patch -contemplative-nursing to provide re: cessation  7. AKI -resolved -consider diuresis  L Adrenal adenoma + Asc Aorta dilatation--both are important but non-salient to current hospitalization I will briefly discuss next steps with him once acute issues resovle   Code Status: full Family Communication:  D/w wife Disposition Plan: inpatient   Verneita Griffes, MD  [facility] Pager [phone] 03/24/2015, 8:27 AM    LOS: 1 day

## 2015-03-24 NOTE — Care Management Note (Addendum)
Case Management Note  Patient Details  Name: KHING BELCHER MRN: [id number] Date of Birth: 1951-03-05  Subjective/Objective:    Pt admitted for Hypoxia. Initiated on 2L . Pt is without a job and insurance.                 Action/Plan: CM did speak with pt in reference to PCP needs. No PCP at this time. CM did call [facility] and they will speak with pt as well. CM did call the [facility] for hospital f/u. The [facility] has pharmacy on site and pt is aware of [facility] generic medications for $4.00. No appointment was able to made at this time due to the clinic is not taking any new appointments. CM did call the Liaison for the [facility] to see if pt is appropriate for hospital f/u. No further needs from CM at this time.    Expected Discharge Date:                  Expected Discharge Plan:  Home/Self Care  In-House Referral:  Financial Counselor  Discharge planning Services  CM Consult, Medication Assistance, Follow-up appt scheduled, [facility]  Post Acute Care Choice:    Choice offered to:     DME Arranged:    DME Agency:     HH Arranged:    HH Agency:     Status of Service:  In process, will continue to follow  Medicare Important Message Given:    Date Medicare IM Given:    Medicare IM give by:    Date Additional Medicare IM Given:    Additional Medicare Important Message give by:     If discussed at [place] of Stay Meetings, dates discussed:    Additional Comments:  1126 03-24-15 Jacqlyn Krauss, RN, BSN [phone] CM was able to speak with [facility] RN and she was able to schedule an appointment at the [facility] for pt on Monday April 04, 2015 @ 4:15. No further needs at this time.   Bethena Roys, RN 03/24/2015, 10:27 AM

## 2015-03-24 NOTE — Progress Notes (Signed)
UR Completed Kriti Katayama Graves-Bigelow, RN,BSN [phone]  

## 2015-03-24 NOTE — Progress Notes (Addendum)
Patient went into rapid afib with HR 160-180s on telemetry, asymptomatic. On IV heparin, will start with IV diltiazem after bolus. Would like to avoid BB or amio given bad lung disease.  Hilbert Corrigan PA Pager: [phone]  Addendum: discussed with Dr Johnsie Cancel, will start IV amio as well for acute a-fib exacerbation. However given his severe COPD, likely short course.  Hilbert Corrigan PA Pager: [phone]

## 2015-03-24 NOTE — Progress Notes (Signed)
RN paged this provider to notify pt CBG is 374. Pt has been trending upward (rather than down) from his sliding scale given at dinner (only 2 units). May consider titration of insulin from sensitive to moderate scale tomorrow during dayshift. For now, pt to receive Novolog 9 units per current scale x 1 dose, recheck CBG in 1 hour, page TRH with results. Pt will be monitored closely.  Plan:  -Novolog 9 units subq x 1 dose -Check CBG in 1 hour -Monitor closely -Page [facility] on-call with result  Benjamine Mola, FNP 03/24/2015     10:34 PM

## 2015-03-25 ENCOUNTER — Encounter (HOSPITAL_COMMUNITY)
Admission: EM | Disposition: A | Discharge status: Home / Self Care | Payer: Self-pay | Source: Home / Self Care | Attending: Family Medicine

## 2015-03-25 ENCOUNTER — Inpatient Hospital Stay (HOSPITAL_COMMUNITY): Payer: Self-pay

## 2015-03-25 DIAGNOSIS — I214 Non-ST elevation (NSTEMI) myocardial infarction: Secondary | ICD-10-CM

## 2015-03-25 DIAGNOSIS — I480 Paroxysmal atrial fibrillation: Secondary | ICD-10-CM

## 2015-03-25 DIAGNOSIS — R7989 Other specified abnormal findings of blood chemistry: Secondary | ICD-10-CM

## 2015-03-25 HISTORY — PX: CARDIAC CATHETERIZATION: SHX172

## 2015-03-25 LAB — RENAL FUNCTION PANEL
ALBUMIN: 2.6 g/dL — AB (ref 3.5–5.0)
Anion gap: 8 (ref 5–15)
BUN: 22 mg/dL — ABNORMAL HIGH (ref 6–20)
CHLORIDE: 96 mmol/L — AB (ref 101–111)
CO2: 33 mmol/L — AB (ref 22–32)
Calcium: 9.3 mg/dL (ref 8.9–10.3)
Creatinine, Ser: 0.96 mg/dL (ref 0.61–1.24)
GFR calc Af Amer: >60 mL/min (ref >60)
Glucose, Bld: 147 mg/dL — ABNORMAL HIGH (ref 65–99)
Phosphorus: 4.1 mg/dL (ref 2.5–4.6)
Potassium: 4.6 mmol/L (ref 3.5–5.1)
Sodium: 137 mmol/L (ref 135–145)

## 2015-03-25 LAB — GLUCOSE, CAPILLARY
Glucose-Capillary: 110 mg/dL — ABNORMAL HIGH (ref 65–99)
Glucose-Capillary: 116 mg/dL — ABNORMAL HIGH (ref 65–99)
Glucose-Capillary: 137 mg/dL — ABNORMAL HIGH (ref 65–99)
Glucose-Capillary: 146 mg/dL — ABNORMAL HIGH (ref 65–99)
Glucose-Capillary: 163 mg/dL — ABNORMAL HIGH (ref 65–99)

## 2015-03-25 LAB — HEMOGLOBIN A1C
Hgb A1c MFr Bld: 5.7 % — ABNORMAL HIGH (ref 4.8–5.6)
Mean Plasma Glucose: 117 mg/dL

## 2015-03-25 LAB — HEPARIN LEVEL (UNFRACTIONATED): HEPARIN UNFRACTIONATED: 0.43 [IU]/mL (ref 0.30–0.70)

## 2015-03-25 LAB — CBC
HEMATOCRIT: 43.9 % (ref 39.0–52.0)
Hemoglobin: 14.1 g/dL (ref 13.0–17.0)
MCH: 32.9 pg (ref 26.0–34.0)
MCHC: 32.1 g/dL (ref 30.0–36.0)
MCV: 102.6 fL — ABNORMAL HIGH (ref 78.0–100.0)
Platelets: 303 10*3/uL (ref 150–400)
RBC: 4.28 MIL/uL (ref 4.22–5.81)
RDW: 13.3 % (ref 11.5–15.5)
WBC: 16 10*3/uL — AB (ref 4.0–10.5)

## 2015-03-25 LAB — PROTIME-INR
INR: 1.14 (ref 0.00–1.49)
INR: 1.12 (ref 0.00–1.49)
Prothrombin Time: 14.8 seconds (ref 11.6–15.2)
Prothrombin Time: 14.6 seconds (ref 11.6–15.2)

## 2015-03-25 LAB — POCT ACTIVATED CLOTTING TIME: Activated Clotting Time: 251 seconds

## 2015-03-25 SURGERY — LEFT HEART CATH AND CORONARY ANGIOGRAPHY
Anesthesia: LOCAL

## 2015-03-25 MED ORDER — NITROGLYCERIN 1 MG/10 ML FOR IR/CATH LAB
INTRA_ARTERIAL | Status: DC | PRN
  Administered 2015-03-25: 15:00:00

## 2015-03-25 MED ORDER — METOPROLOL TARTRATE 1 MG/ML IV SOLN
INTRAVENOUS | Status: AC
  Filled 2015-03-25: qty 5

## 2015-03-25 MED ORDER — LIDOCAINE HCL (PF) 1 % IJ SOLN
INTRAMUSCULAR | Status: AC
  Filled 2015-03-25: qty 30

## 2015-03-25 MED ORDER — SODIUM CHLORIDE 0.9 % IJ SOLN: 3.0000 mL | INTRAMUSCULAR | Status: DC | PRN

## 2015-03-25 MED ORDER — MIDAZOLAM HCL 2 MG/2ML IJ SOLN
INTRAMUSCULAR | Status: AC
  Filled 2015-03-25: qty 2

## 2015-03-25 MED ORDER — VERAPAMIL HCL 2.5 MG/ML IV SOLN
INTRAVENOUS | Status: AC
  Filled 2015-03-25: qty 2

## 2015-03-25 MED ORDER — MIDAZOLAM HCL 2 MG/2ML IJ SOLN
INTRAMUSCULAR | Status: DC | PRN
  Administered 2015-03-25: 1 mg via INTRAVENOUS

## 2015-03-25 MED ORDER — HEPARIN (PORCINE) IN NACL 100-0.45 UNIT/ML-% IJ SOLN: 1500.0000 [IU]/h | INTRAMUSCULAR | Status: DC

## 2015-03-25 MED ORDER — IOHEXOL 350 MG/ML SOLN
INTRAVENOUS | Status: DC | PRN
  Administered 2015-03-25: 140 mL via INTRACARDIAC

## 2015-03-25 MED ORDER — CLOPIDOGREL BISULFATE 75 MG PO TABS
75.0000 mg | ORAL_TABLET | Freq: Every day | ORAL | Status: DC
  Administered 2015-03-26 – 2015-03-27 (×2): 75 mg via ORAL
  Filled 2015-03-25 (×2): qty 1

## 2015-03-25 MED ORDER — HEPARIN SODIUM (PORCINE) 1000 UNIT/ML IJ SOLN
INTRAMUSCULAR | Status: AC
  Filled 2015-03-25: qty 1

## 2015-03-25 MED ORDER — FENTANYL CITRATE (PF) 100 MCG/2ML IJ SOLN
INTRAMUSCULAR | Status: AC
  Filled 2015-03-25: qty 2

## 2015-03-25 MED ORDER — METOPROLOL TARTRATE 1 MG/ML IV SOLN
INTRAVENOUS | Status: DC | PRN
  Administered 2015-03-25: 2.5 mg via INTRAVENOUS

## 2015-03-25 MED ORDER — CLOPIDOGREL BISULFATE 300 MG PO TABS
ORAL_TABLET | ORAL | Status: AC
  Filled 2015-03-25: qty 2

## 2015-03-25 MED ORDER — ADENOSINE (DIAGNOSTIC) 140MCG/KG/MIN
INTRAVENOUS | Status: DC | PRN
  Administered 2015-03-25: 140 ug/kg/min via INTRAVENOUS

## 2015-03-25 MED ORDER — OFF THE BEAT BOOK
Freq: Once | Status: AC
  Administered 2015-03-25: 11:00:00
  Filled 2015-03-25: qty 1

## 2015-03-25 MED ORDER — SODIUM CHLORIDE 0.9 % IV SOLN: INTRAVENOUS | Status: AC

## 2015-03-25 MED ORDER — HEPARIN SODIUM (PORCINE) 1000 UNIT/ML IJ SOLN
INTRAMUSCULAR | Status: DC | PRN
  Administered 2015-03-25: 6000 [IU] via INTRAVENOUS
  Administered 2015-03-25: 2000 [IU] via INTRAVENOUS
  Administered 2015-03-25: 4000 [IU] via INTRAVENOUS

## 2015-03-25 MED ORDER — SODIUM CHLORIDE 0.9 % IJ SOLN
3.0000 mL | Freq: Two times a day (BID) | INTRAMUSCULAR | Status: DC
  Administered 2015-03-26: 3 mL via INTRAVENOUS

## 2015-03-25 MED ORDER — NITROGLYCERIN 1 MG/10 ML FOR IR/CATH LAB
INTRA_ARTERIAL | Status: AC
  Filled 2015-03-25: qty 10

## 2015-03-25 MED ORDER — OFF THE BEAT BOOK
Freq: Once | Status: AC
  Administered 2015-03-25: 22:00:00
  Filled 2015-03-25: qty 1

## 2015-03-25 MED ORDER — ADENOSINE 12 MG/4ML IV SOLN
16.0000 mL | Freq: Once | INTRAVENOUS | Status: DC
  Filled 2015-03-25: qty 16

## 2015-03-25 MED ORDER — HEPARIN (PORCINE) IN NACL 100-0.45 UNIT/ML-% IJ SOLN
1500.0000 [IU]/h | INTRAMUSCULAR | Status: DC
  Administered 2015-03-26: 05:00:00 1500 [IU]/h via INTRAVENOUS
  Filled 2015-03-25: qty 250

## 2015-03-25 MED ORDER — VERAPAMIL HCL 2.5 MG/ML IV SOLN
INTRAVENOUS | Status: DC | PRN
  Administered 2015-03-25: 14:00:00 via INTRA_ARTERIAL

## 2015-03-25 MED ORDER — SODIUM CHLORIDE 0.9 % IV SOLN: 250.0000 mL | INTRAVENOUS | Status: DC | PRN

## 2015-03-25 MED ORDER — HEPARIN (PORCINE) IN NACL 2-0.9 UNIT/ML-% IJ SOLN
INTRAMUSCULAR | Status: AC
  Filled 2015-03-25: qty 1500

## 2015-03-25 MED ORDER — FENTANYL CITRATE (PF) 100 MCG/2ML IJ SOLN
INTRAMUSCULAR | Status: DC | PRN
  Administered 2015-03-25 (×2): 25 ug via INTRAVENOUS

## 2015-03-25 MED ORDER — CLOPIDOGREL BISULFATE 75 MG PO TABS
ORAL_TABLET | ORAL | Status: DC | PRN
  Administered 2015-03-25: 600 mg via ORAL

## 2015-03-25 MED ORDER — DILTIAZEM HCL ER COATED BEADS 240 MG PO CP24
240.0000 mg | ORAL_CAPSULE | Freq: Every day | ORAL | Status: DC
Start: 2015-03-25 — End: 2015-03-27
  Administered 2015-03-25 – 2015-03-27 (×3): 240 mg via ORAL
  Filled 2015-03-25 (×3): qty 1

## 2015-03-25 MED ORDER — ATORVASTATIN CALCIUM 40 MG PO TABS
40.0000 mg | ORAL_TABLET | Freq: Every day | ORAL | Status: DC
Start: 2015-03-25 — End: 2015-03-27
  Administered 2015-03-25 – 2015-03-26 (×2): 40 mg via ORAL
  Filled 2015-03-25 (×2): qty 1

## 2015-03-25 SURGICAL SUPPLY — 24 items
BALLN EUPHORA RX 2.0X12 (BALLOONS) ×2
BALLOON EUPHORA RX 2.0X12 (BALLOONS) IMPLANT
CATH INFINITI 5 FR JL3.5 (CATHETERS) ×2 IMPLANT
CATH INFINITI 5FR ANG PIGTAIL (CATHETERS) ×2 IMPLANT
CATH INFINITI 5FR MULTPACK ANG (CATHETERS) IMPLANT
CATH INFINITI JR4 5F (CATHETERS) ×2 IMPLANT
CATH MICROCATH NAVVUS (MICROCATHETER) IMPLANT
CATH VISTA GUIDE 6FR JR4 (CATHETERS) ×1 IMPLANT
DEVICE RAD COMP TR BAND LRG (VASCULAR PRODUCTS) ×2 IMPLANT
GLIDESHEATH SLEND SS 6F .021 (SHEATH) ×2 IMPLANT
KIT ENCORE 26 ADVANTAGE (KITS) ×1 IMPLANT
KIT ESSENTIALS PG (KITS) ×1 IMPLANT
KIT HEART LEFT (KITS) ×2 IMPLANT
MICROCATHETER NAVVUS (MICROCATHETER) ×2
PACK CARDIAC CATHETERIZATION (CUSTOM PROCEDURE TRAY) ×2 IMPLANT
SHEATH PINNACLE 5F 10CM (SHEATH) IMPLANT
STENT SYNERGY DES 2.75X12 (Permanent Stent) ×2 IMPLANT
SYR MEDRAD MARK V 150ML (SYRINGE) ×2 IMPLANT
TRANSDUCER W/STOPCOCK (MISCELLANEOUS) ×2 IMPLANT
TUBING CIL FLEX 10 FLL-RA (TUBING) ×2 IMPLANT
WIRE COUGAR XT STRL 190CM (WIRE) ×1 IMPLANT
WIRE EMERALD 3MM-J .035X150CM (WIRE) IMPLANT
WIRE MAILMAN 182CM (WIRE) ×1 IMPLANT
WIRE SAFE-T 1.5MM-J .035X260CM (WIRE) ×2 IMPLANT

## 2015-03-25 NOTE — Progress Notes (Signed)
TR BAND REMOVAL  LOCATION:    right radial  DEFLATED PER PROTOCOL:    Yes.    TIME BAND OFF / DRESSING APPLIED:    1930   SITE UPON ARRIVAL:    Level 0  SITE AFTER BAND REMOVAL:    Level 0  CIRCULATION SENSATION AND MOVEMENT:    Within Normal Limits   Yes.     

## 2015-03-25 NOTE — Progress Notes (Addendum)
ANTICOAGULATION CONSULT NOTE - Follow Up Consult  Pharmacy Consult for heparin Indication: chest pain/ACS  No Known Allergies  Patient Measurements: Height: 6' (182.9 cm) Weight: 178 lb 9.2 oz (81 kg) IBW/kg (Calculated) : 77.6 Heparin Dosing Weight: 81 kg  Vital Signs: Temp: 97.3 F (36.3 C) (07/08 1003) Temp Source: Oral (07/08 1003) BP: 139/87 mmHg (07/08 1520) Pulse Rate: 78 (07/08 1520)  Labs:  Recent Labs  03/23/15 1350  03/23/15 2208 03/24/15 0505 03/24/15 0758 03/24/15 1035 03/25/15 0353  HGB 15.7  --   --  14.1  --   --  14.1  HCT 48.0  --   --  43.9  --   --  43.9  PLT 265  --   --  273  --   --  303  LABPROT  --   --   --   --   --   --  14.6  14.8  INR  --   --   --   --   --   --  1.12  1.14  HEPARINUNFRC  --   --  0.21*  --  0.31  --  0.43  CREATININE 1.56*  --   --  0.97  --   --  0.96  TROPONINI  --   < > 0.43* 0.32*  --  0.23*  --   < > = values in this interval not displayed.  Estimated Creatinine Clearance: 86.4 mL/min (by C-G formula based on Cr of 0.96).   Medications:  Prescriptions prior to admission  Medication Sig Dispense Refill Last Dose  . diphenhydramine-acetaminophen (TYLENOL PM) 25-500 MG TABS Take 3 tablets by mouth at bedtime as needed (sleep).   03/22/2015 at Unknown time    Assessment: 64yo male with history of CAD presents with CP. He is now s/p cath and to resume heparin 8 hrs post sheath removal (sheath remove at 3:12pm today and TR band placed). Plans noted for likely apixiban.  -last heparin rate was 1500 units/hr and heparin level was at goal (HL= 0.43).   Goal of Therapy:  Heparin level 0.3-0.7 units/ml Monitor platelets by anticoagulation protocol: Yes   Plan:  -Restart heparin at 1500 units/hr (will begin at 02:77AJ if no complications with TR band) -Daily heparin level and CBC -Will follow plans for apixiban  Hildred Laser, Pharm D 03/25/2015 4:06 PM

## 2015-03-25 NOTE — Progress Notes (Signed)
ANTICOAGULATION CONSULT NOTE - Follow Up Consult  Pharmacy Consult for heparin Indication: chest pain/ACS  No Known Allergies  Patient Measurements: Height: 6' (182.9 cm) Weight: 178 lb 9.2 oz (81 kg) IBW/kg (Calculated) : 77.6 Heparin Dosing Weight: 81 kg  Vital Signs: Temp: 98.3 F (36.8 C) (07/08 0600) Temp Source: Oral (07/08 0600) BP: 142/71 mmHg (07/08 0600) Pulse Rate: 84 (07/08 0600)  Labs:  Recent Labs  03/23/15 1350  03/23/15 2208 03/24/15 0505 03/24/15 0758 03/24/15 1035 03/25/15 0353  HGB 15.7  --   --  14.1  --   --  14.1  HCT 48.0  --   --  43.9  --   --  43.9  PLT 265  --   --  273  --   --  303  LABPROT  --   --   --   --   --   --  14.6  14.8  INR  --   --   --   --   --   --  1.12  1.14  HEPARINUNFRC  --   --  0.21*  --  0.31  --  0.43  CREATININE 1.56*  --   --  0.97  --   --  0.96  TROPONINI  --   < > 0.43* 0.32*  --  0.23*  --   < > = values in this interval not displayed.  Estimated Creatinine Clearance: 86.4 mL/min (by C-G formula based on Cr of 0.96).   Medications:  Prescriptions prior to admission  Medication Sig Dispense Refill Last Dose  . diphenhydramine-acetaminophen (TYLENOL PM) 25-500 MG TABS Take 3 tablets by mouth at bedtime as needed (sleep).   03/22/2015 at Unknown time    Assessment:  64yo male with history of CAD presents with CP. Pharmacy is consulted to dose heparin for ACS/chest pain. Daily heparin level 0.43, therapeutic x 2.  Goal of Therapy:  Heparin level 0.3-0.7 units/ml Monitor platelets by anticoagulation protocol: Yes   Plan:  -Continue heparin 1500 units/hr -Daily CBC/HL -f/u post cath this AM  Angela Burke, PharmD Pharmacy Resident  03/25/2015,8:20 AM

## 2015-03-25 NOTE — H&P (View-Only) (Signed)
Patient Name: Omar Singh Date of Encounter: 03/25/2015  Primary Cardiologist: new - stent placed by Dr. Albertine Patricia in 2008 and seen Dr. Domenic Polite remotely    Active Problems:   Hypoxia   Shortness of breath   CAD S/P percutaneous coronary angioplasty   Elevated troponin   Acute kidney injury   Pulmonary nodule   Alcohol abuse   Tobacco abuse   Acute respiratory failure with hypoxemia   COPD exacerbation    SUBJECTIVE  Went into a-fib yesterday which is new for him. No CP overnight. States has been having wheezing almost on a daily basis at home.  CURRENT MEDS . amLODipine  5 mg Oral Daily  . antiseptic oral rinse  7 mL Mouth Rinse BID  . aspirin  325 mg Oral Daily  . doxycycline  100 mg Oral Q12H  . folic acid  1 mg Oral Daily  . hydrALAZINE  50 mg Oral 4 times per day  . insulin aspart  0-9 Units Subcutaneous TID WC  . ipratropium-albuterol  3 mL Nebulization Q6H  . isosorbide dinitrate  5 mg Oral BID  . methylPREDNISolone (SOLU-MEDROL) injection  60 mg Intravenous 4 times per day  . multivitamin with minerals  1 tablet Oral Daily  . nicotine  21 mg Transdermal Daily  . sodium chloride  3 mL Intravenous Q12H  . sodium chloride  3 mL Intravenous Q12H  . thiamine  100 mg Oral Daily   Or  . thiamine  100 mg Intravenous Daily    OBJECTIVE  Filed Vitals:   03/25/15 0142 03/25/15 0242 03/25/15 0400 03/25/15 0600  BP: 125/69 120/67 133/61 142/71  Pulse: 78 74 86 84  Temp:   98.2 F (36.8 C) 98.3 F (36.8 C)  TempSrc:   Oral Oral  Resp: 23 25 18 17   Height:      Weight:      SpO2: 97% 91% 92% 89%    Intake/Output Summary (Last 24 hours) at 03/25/15 0741 Last data filed at 03/25/15 0551  Gross per 24 hour  Intake    240 ml  Output   1450 ml  Net  -1210 ml   Filed Weights   03/23/15 1244 03/23/15 1746 03/24/15 0600  Weight: 185 lb (83.915 kg) 177 lb 11.2 oz (80.604 kg) 178 lb 9.2 oz (81 kg)    PHYSICAL EXAM  General: Pleasant, NAD. Neuro: Alert and  oriented X 3. Moves all extremities spontaneously. Psych: Normal affect. HEENT:  Normal  Neck: Supple without bruits or JVD. Lungs:  Resp regular and unlabored. Diffuse wheezing and rhonchi, diminished breath sound.  Heart: RRR no s3, s4, or murmurs. Abdomen: Soft, non-tender, non-distended, BS + x 4.  Extremities: No clubbing, cyanosis or edema. DP/PT/Radials 2+ and equal bilaterally.  Accessory Clinical Findings  CBC  Recent Labs  03/24/15 0505 03/25/15 0353  WBC 11.0* 16.0*  HGB 14.1 14.1  HCT 43.9 43.9  MCV 102.6* 102.6*  PLT 273 211   Basic Metabolic Panel  Recent Labs  03/24/15 0505 03/25/15 0353  NA 137 137  K 5.1 4.6  CL 97* 96*  CO2 32 33*  GLUCOSE 126* 147*  BUN 18 22*  CREATININE 0.97 0.96  CALCIUM 9.2 9.3  PHOS  --  4.1   Liver Function Tests  Recent Labs  03/23/15 1620 03/25/15 0353  AST 23  --   ALT 20  --   ALKPHOS 75  --   BILITOT 0.6  --   PROT 7.7  --  ALBUMIN 3.1* 2.6*   Cardiac Enzymes  Recent Labs  03/23/15 2208 03/24/15 0505 03/24/15 1035  TROPONINI 0.43* 0.32* 0.23*   Fasting Lipid Panel  Recent Labs  03/24/15 0505  CHOL 133  HDL 29*  LDLCALC 87  TRIG 86  CHOLHDL 4.6   Thyroid Function Tests  Recent Labs  03/23/15 1620  TSH 0.928    TELE A-fib with HR 160-180s yesterday, converted to NSR around 6:35pm yesterday    ECG  7/7 a-fib with RVR   Radiology/Studies  Dg Chest 2 View  03/23/2015   CLINICAL DATA:  Left-sided chest pain for 1 week, more severe today  EXAM: CHEST  2 VIEW  COMPARISON:  03/13/2006  FINDINGS: The heart size and vascular pattern are normal. There is no infiltrate consolidation or edema. There is a 1.3 cm nodular opacity over the left lateral costophrenic angle which is not seen on the prior study.  IMPRESSION: Recommend CT thorax for possible pulmonary nodule.  These results will be called to the ordering clinician or representative by the Radiologist Assistant, and communication  documented in the PACS or zVision Dashboard.   Electronically Signed   By: Skipper Cliche M.D.   On: 03/23/2015 13:34   Ct Angio Chest Pe W/cm &/or Wo Cm  03/23/2015   CLINICAL DATA:  Chest pain for 4 weeks worse today, cough, shortness of breath, history smoking, hypertension, hyperlipidemia, coronary artery disease post angioplasty  EXAM: CT ANGIOGRAPHY CHEST WITH CONTRAST  TECHNIQUE: Multidetector CT imaging of the chest was performed using the standard protocol during bolus administration of intravenous contrast. Multiplanar CT image reconstructions and MIPs were obtained to evaluate the vascular anatomy.  CONTRAST:  18mL OMNIPAQUE IOHEXOL 350 MG/ML SOLN IV  COMPARISON:  None  FINDINGS: Scattered atherosclerotic calcifications aorta, coronary arteries, and proximal great vessels.  Minimal aneurysmal dilatation of ascending thoracic aorta 4.0 x 4.1 cm image 47.  Scattered respiratory motion artifacts which limit exam.  Dilated central pulmonary arteries question pulmonary arterial hypertension.  Pulmonary arteries well opacified and grossly patent.  No definite evidence of pulmonary embolism.  Few scattered normal sized mediastinal lymph nodes.  Low-attenuation LEFT adrenal nodule 2.3 x 1.6 cm compatible with adenoma.  Remaining visualized upper abdomen unremarkable.  Peribronchial vascular infiltrate identified in both lower lobes RIGHT greater than LEFT, minimally in RIGHT upper lobe, question bronchiolitis consistent with COPD.  Underlying emphysematous changes and central peribronchial thickening.  No segmental consolidation, pleural effusion or pneumothorax.  No acute osseous findings.  Review of the MIP images confirms the above findings.  IMPRESSION: No evidence pulmonary embolism.  COPD changes with peribronchovascular infiltrates in both lower lobes RIGHT greater than LEFT and less in RIGHT upper lobe question bronchiolitis.  Scattered atherosclerotic disease with aneurysmal dilatation of the  ascending thoracic aorta, recommendation below.  Recommend annual imaging followup by CTA or MRA. This recommendation follows 2010 ACCF/AHA/AATS/ACR/ASA/SCA/SCAI/SIR/STS/SVM Guidelines for the Diagnosis and Management of Patients with Thoracic Aortic Disease. Circulation. 2010; 121: K184-C375   Electronically Signed   By: Lavonia Dana M.D.   On: 03/23/2015 17:21    ASSESSMENT AND PLAN  1. Progressitve cough and dyspnea 2/2 COPD exacerbation  - continue to have significant dyspnea and cough, recurring issue per pt, tried to quit smoking twice, however eventually resumed smoking. Encouraged him to attempt tobacco cessation again.  2. Atypical chest pain with elevated trop  - no sign of HF on exam, CTA of chest showed no PE, however COPD changes with peribronchovascular infiltrates in  both lower lobes R > L question bronchiolities  - seen by Dr. Johnsie Cancel yesterday, felt high likelyhood of recurrence disease, planning for cath today to clarify CAD, note new onset of a-fib yesterday which has since converted to NSR on amio   - Risk and benefit of procedure explained to the patient who display clear understanding and agree to proceed. Discussed with patient possible procedural risk include bleeding, vascular injury, renal injury, arrythmia, MI, stroke and loss of limb or life.  3. Newly diagnosed proxysmal atrial fibrillation in the setting of COPD exacerbation  - CHA2DS2-Vasc score 2 (HTN, CAD)  - no BB with severe lung dx, on 10 IV diltiazem, transition to diltiazem CD 240mg  daily  - started on amio yesterday, likely can be transition to PO today, will discuss with MD regarding duration, given his severe COPD, would like to do only short course of amio to avoid pulm toxicity associated with long term use.  - consider systemic anticoagulation after cath  CAD s/p multi-link vision stent to mid RCA in 2008  4. AKI  5. HTN: uncontrolled  6. HLD  7. Tobacco abuse  8. ETOH abuse  9. 1.3 cm  pulmonary nodule on CXR, did not see significant nodule mentioned in CTA report 10. L adrenal nodule 2.3 x 1.6cm compatible with adenoma seen on CTA of chest 11. Aneurysmal dilatation of ascending thoracic aorta 4.0 x 4.1 cm 12. Hyperglycemia: A1C 5.7  Signed, Almyra Deforest PA-C Pager: [phone] As above, patient seen and examined. He remains dyspneic. No chest pain. Predominant issue appears to be COPD exacerbation. Continue pulmonary toilet. He has been scheduled for cardiac catheterization today for chest pain and elevated troponin per Dr. Johnsie Cancel. No beta blocker given COPD. Patient developed new onset atrial fibrillation and was placed on amiodarone and has converted to sinus rhythm. This is most likely related to his COPD. I would be hesitant to continue amiodarone long-term given lung disease. I will discontinue amiodarone and continue Cardizem. CHADSvasc 2. Would begin apixaban once all procedures complete. Discontinue Norvasc as he is on Cardizem. Add statin given history of coronary artery disease. Patient will need follow-up chest CT in the future 4 mildly dilated thoracic aorta. FU Dr Mare Ferrari following DC. Kirk Ruths

## 2015-03-25 NOTE — Interval H&P Note (Signed)
History and Physical Interval Note:  03/25/2015 1:53 PM  Omar Singh  has presented today for cardiac cath with the diagnosis of NSTEMI.  The various methods of treatment have been discussed with the patient and family. After consideration of risks, benefits and other options for treatment, the patient has consented to  Procedure(s): Left Heart Cath and Coronary Angiography (N/A) as a surgical intervention .  The patient's history has been reviewed, patient examined, no change in status, stable for surgery.  I have reviewed the patient's chart and labs.  Questions were answered to the patient's satisfaction.    Cath Lab Visit (complete for each Cath Lab visit)  Clinical Evaluation Leading to the Procedure:   ACS: Yes.    Non-ACS:    Anginal Classification: CCS IV  Anti-ischemic medical therapy: No Therapy  Non-Invasive Test Results: No non-invasive testing performed  Prior CABG: No previous CABG        Milyn Stapleton

## 2015-03-25 NOTE — Progress Notes (Addendum)
ESPEN BETHEL CWU:[id number] DOB: 1951-06-11 DOA: 03/23/2015 PCP: No PCP Per Patient  Brief narrative: 64 y/o ? prior CAD ? Mid RCA DES stenting 2008, Continued EToH [[i drink 6-8 beers a day], 72 yr-pack h/o smoking, htn, hld, admitted  He has not seen an MD in ~ 6 yrs He is retired, used to be a Dealer He lives with his wife who quit smoking 3 yrs ago after diagnosis of lung Ca He presented to [facility] 03/23/15 with episodic 6 mo CP and cough which suddenyl worsened and became unrelenting 1-2 days PTA Found on CTA to have ? Nodule and L Suprarenal adrenal mass pending further characterization Cardiology consulted as ? troponin Hospital stay complicate dby Afib + RVR-Chad score ~ 2  Past medical history-As per Problem list Chart reviewed as below-   Consultants:  Cardiology  Procedures:    Antibiotics:     Subjective   Better  No n/v/cp tol diet friedn in room  Determiend to quit smoking No blurred or double vision Seems disgruntled   Objective    Interim History:   Telemetry: Tele nsr   Objective: Filed Vitals:   03/25/15 0600 03/25/15 0829 03/25/15 0832 03/25/15 1003  BP: 142/71  127/78 127/78  Pulse: 84  83 88  Temp: 98.3 F (36.8 C)  97.6 F (36.4 C) 97.3 F (36.3 C)  TempSrc: Oral  Oral Oral  Resp: 17  19 21   Height:      Weight:      SpO2: 89% 96% 98% 90%    Intake/Output Summary (Last 24 hours) at 03/25/15 1345 Last data filed at 03/25/15 0925  Gross per 24 hour  Intake    840 ml  Output   1850 ml  Net  -1010 ml    Exam:  General: eomi, tired appeareing but pleasant male, visible ? accesory muslce use.  Wheeze can be heard from bedsdie Cardiovascular: no pallor, no ict, s1 s2 tachy slighlty Respiratory: wheezy++, no rales nor rhonchi Abdomen: soft n nd Skin no le edema Neuro intact  Data Reviewed: Basic Metabolic Panel:  Recent Labs Lab 03/23/15 1350 03/24/15 0505 03/25/15 0353  NA 137 137 137  K 4.2 5.1 4.6  CL 92*  97* 96*  CO2 32 32 33*  GLUCOSE 163* 126* 147*  BUN 19 18 22*  CREATININE 1.56* 0.97 0.96  CALCIUM 9.6 9.2 9.3  PHOS  --   --  4.1   Liver Function Tests:  Recent Labs Lab 03/23/15 1620 03/25/15 0353  AST 23  --   ALT 20  --   ALKPHOS 75  --   BILITOT 0.6  --   PROT 7.7  --   ALBUMIN 3.1* 2.6*   No results for input(s): LIPASE, AMYLASE in the last 168 hours. No results for input(s): AMMONIA in the last 168 hours. CBC:  Recent Labs Lab 03/23/15 1350 03/24/15 0505 03/25/15 0353  WBC 13.8* 11.0* 16.0*  HGB 15.7 14.1 14.1  HCT 48.0 43.9 43.9  MCV 103.0* 102.6* 102.6*  PLT 265 273 303   Cardiac Enzymes:  Recent Labs Lab 03/23/15 1620 03/23/15 2208 03/24/15 0505 03/24/15 1035  TROPONINI 0.52* 0.43* 0.32* 0.23*   BNP: Invalid input(s): POCBNP CBG:  Recent Labs Lab 03/24/15 1734 03/24/15 2153 03/25/15 0014 03/25/15 0729 03/25/15 1133  GLUCAP 158* 374* 110* 163* 116*    No results found for this or any previous visit (from the past 240 hour(s)).   Studies:  All Imaging reviewed and is as per above notation   Scheduled Meds: . [MAR Hold] antiseptic oral rinse  7 mL Mouth Rinse BID  . [MAR Hold] aspirin  325 mg Oral Daily  . [MAR Hold] atorvastatin  40 mg Oral q1800  . [MAR Hold] diltiazem  240 mg Oral Daily  . [MAR Hold] doxycycline  100 mg Oral Q12H  . [MAR Hold] folic acid  1 mg Oral Daily  . [MAR Hold] hydrALAZINE  50 mg Oral 4 times per day  . [MAR Hold] insulin aspart  0-9 Units Subcutaneous TID WC  . [MAR Hold] ipratropium-albuterol  3 mL Nebulization Q6H  . [MAR Hold] isosorbide dinitrate  5 mg Oral BID  . [MAR Hold] methylPREDNISolone (SOLU-MEDROL) injection  60 mg Intravenous 4 times per day  . [MAR Hold] multivitamin with minerals  1 tablet Oral Daily  . [MAR Hold] nicotine  21 mg Transdermal Daily  . [MAR Hold] sodium chloride  3 mL Intravenous Q12H  . sodium chloride  3 mL Intravenous Q12H  . [MAR Hold] thiamine  100 mg  Oral Daily   Or  . [MAR Hold] thiamine  100 mg Intravenous Daily   Continuous Infusions: . sodium chloride 10 mL/hr at 03/25/15 0547  . heparin 1,500 Units/hr (03/24/15 2224)  . sodium chloride 0.9 % 1,000 mL infusion Stopped (03/24/15 0160)     Assessment/Plan:  1. AECOPD- -Solumedrol 60 q6 for 6 doses -Non-purulent sputum?  -would still continue po levaquin for COPD exacerbation per ISDA guidelines -Duonebs per RT -will re-visit clinically later today  2. P AFib + RVR=-CHad2Vasc2=2 -Appreciate Cardiology input -converted to NSR 1830 03/24/15 -Amio off soon?  -continue Cardizem 240 CD  3.   Elevated troponin with mod suspicion for Angina-Prior CAD 2014 -rec'd ASA and started don Hep per Cardiology -Cardiac cath given h/o pending 7/8 -troponin trending downwards -continue Heparin gtt --->Elliquis dependant on cath results -appreciate Dr. Johnsie Cancel input  3.         Hld -   ASCVD risk already elevated - High intensitying Lipitor 40 mg daily for 2/2 preventon  4. EToH -stopped drinking 6 days ago to cardiology-stopped drinking 9 days ago when i asked him  -CIWA is 0 -d/c CIWA 7/8  5.  Htn urgency -add hydralazine scheduled po 50 q6 -add isordil 5 bid in addition -defer diuresis for now to cardiology -cath should tell us hemodynamics  6.   Smoker   -needs Nicotine patch -contemplative-nursing to provide re: cessation  7. AKI -resolved -consider diuresis  8. Impaired gluc tolerance -SSI coverage for now -will taper steroids and expect hyperglycemia will improve -no Long acting at present  L Adrenal adenoma + Asc Aorta dilatation--both are important but non-salient to current hospitalization I will briefly discuss next steps with him once acute issues resovle   Code Status: full Family Communication:  D/w patient alone Disposition Plan: inpatient   Verneita Griffes, MD  [facility] Pager [phone] 03/25/2015, 1:45 PM    LOS: 2 days

## 2015-03-25 NOTE — Progress Notes (Signed)
Received call from cath lab pt going to [facility] and not returning to floor.

## 2015-03-25 NOTE — Progress Notes (Signed)
Pt left unit to cath Lab via stretcher. Spouse at the bedside at the time of transfer.

## 2015-03-25 NOTE — Progress Notes (Signed)
Belongings taken to 6C03, remitted to spouse.

## 2015-03-25 NOTE — Progress Notes (Addendum)
Patient Name: Omar Singh Date of Encounter: 03/25/2015  Primary Cardiologist: new - stent placed by Dr. Albertine Patricia in 2008 and seen Dr. Domenic Polite remotely    Active Problems:   Hypoxia   Shortness of breath   CAD S/P percutaneous coronary angioplasty   Elevated troponin   Acute kidney injury   Pulmonary nodule   Alcohol abuse   Tobacco abuse   Acute respiratory failure with hypoxemia   COPD exacerbation    SUBJECTIVE  Went into a-fib yesterday which is new for him. No CP overnight. States has been having wheezing almost on a daily basis at home.  CURRENT MEDS . amLODipine  5 mg Oral Daily  . antiseptic oral rinse  7 mL Mouth Rinse BID  . aspirin  325 mg Oral Daily  . doxycycline  100 mg Oral Q12H  . folic acid  1 mg Oral Daily  . hydrALAZINE  50 mg Oral 4 times per day  . insulin aspart  0-9 Units Subcutaneous TID WC  . ipratropium-albuterol  3 mL Nebulization Q6H  . isosorbide dinitrate  5 mg Oral BID  . methylPREDNISolone (SOLU-MEDROL) injection  60 mg Intravenous 4 times per day  . multivitamin with minerals  1 tablet Oral Daily  . nicotine  21 mg Transdermal Daily  . sodium chloride  3 mL Intravenous Q12H  . sodium chloride  3 mL Intravenous Q12H  . thiamine  100 mg Oral Daily   Or  . thiamine  100 mg Intravenous Daily    OBJECTIVE  Filed Vitals:   03/25/15 0142 03/25/15 0242 03/25/15 0400 03/25/15 0600  BP: 125/69 120/67 133/61 142/71  Pulse: 78 74 86 84  Temp:   98.2 F (36.8 C) 98.3 F (36.8 C)  TempSrc:   Oral Oral  Resp: 23 25 18 17   Height:      Weight:      SpO2: 97% 91% 92% 89%    Intake/Output Summary (Last 24 hours) at 03/25/15 0741 Last data filed at 03/25/15 0551  Gross per 24 hour  Intake    240 ml  Output   1450 ml  Net  -1210 ml   Filed Weights   03/23/15 1244 03/23/15 1746 03/24/15 0600  Weight: 185 lb (83.915 kg) 177 lb 11.2 oz (80.604 kg) 178 lb 9.2 oz (81 kg)    PHYSICAL EXAM  General: Pleasant, NAD. Neuro: Alert and  oriented X 3. Moves all extremities spontaneously. Psych: Normal affect. HEENT:  Normal  Neck: Supple without bruits or JVD. Lungs:  Resp regular and unlabored. Diffuse wheezing and rhonchi, diminished breath sound.  Heart: RRR no s3, s4, or murmurs. Abdomen: Soft, non-tender, non-distended, BS + x 4.  Extremities: No clubbing, cyanosis or edema. DP/PT/Radials 2+ and equal bilaterally.  Accessory Clinical Findings  CBC  Recent Labs  03/24/15 0505 03/25/15 0353  WBC 11.0* 16.0*  HGB 14.1 14.1  HCT 43.9 43.9  MCV 102.6* 102.6*  PLT 273 364   Basic Metabolic Panel  Recent Labs  03/24/15 0505 03/25/15 0353  NA 137 137  K 5.1 4.6  CL 97* 96*  CO2 32 33*  GLUCOSE 126* 147*  BUN 18 22*  CREATININE 0.97 0.96  CALCIUM 9.2 9.3  PHOS  --  4.1   Liver Function Tests  Recent Labs  03/23/15 1620 03/25/15 0353  AST 23  --   ALT 20  --   ALKPHOS 75  --   BILITOT 0.6  --   PROT 7.7  --  ALBUMIN 3.1* 2.6*   Cardiac Enzymes  Recent Labs  03/23/15 2208 03/24/15 0505 03/24/15 1035  TROPONINI 0.43* 0.32* 0.23*   Fasting Lipid Panel  Recent Labs  03/24/15 0505  CHOL 133  HDL 29*  LDLCALC 87  TRIG 86  CHOLHDL 4.6   Thyroid Function Tests  Recent Labs  03/23/15 1620  TSH 0.928    TELE A-fib with HR 160-180s yesterday, converted to NSR around 6:35pm yesterday    ECG  7/7 a-fib with RVR   Radiology/Studies  Dg Chest 2 View  03/23/2015   CLINICAL DATA:  Left-sided chest pain for 1 week, more severe today  EXAM: CHEST  2 VIEW  COMPARISON:  03/13/2006  FINDINGS: The heart size and vascular pattern are normal. There is no infiltrate consolidation or edema. There is a 1.3 cm nodular opacity over the left lateral costophrenic angle which is not seen on the prior study.  IMPRESSION: Recommend CT thorax for possible pulmonary nodule.  These results will be called to the ordering clinician or representative by the Radiologist Assistant, and communication  documented in the PACS or zVision Dashboard.   Electronically Signed   By: Skipper Cliche M.D.   On: 03/23/2015 13:34   Ct Angio Chest Pe W/cm &/or Wo Cm  03/23/2015   CLINICAL DATA:  Chest pain for 4 weeks worse today, cough, shortness of breath, history smoking, hypertension, hyperlipidemia, coronary artery disease post angioplasty  EXAM: CT ANGIOGRAPHY CHEST WITH CONTRAST  TECHNIQUE: Multidetector CT imaging of the chest was performed using the standard protocol during bolus administration of intravenous contrast. Multiplanar CT image reconstructions and MIPs were obtained to evaluate the vascular anatomy.  CONTRAST:  175mL OMNIPAQUE IOHEXOL 350 MG/ML SOLN IV  COMPARISON:  None  FINDINGS: Scattered atherosclerotic calcifications aorta, coronary arteries, and proximal great vessels.  Minimal aneurysmal dilatation of ascending thoracic aorta 4.0 x 4.1 cm image 47.  Scattered respiratory motion artifacts which limit exam.  Dilated central pulmonary arteries question pulmonary arterial hypertension.  Pulmonary arteries well opacified and grossly patent.  No definite evidence of pulmonary embolism.  Few scattered normal sized mediastinal lymph nodes.  Low-attenuation LEFT adrenal nodule 2.3 x 1.6 cm compatible with adenoma.  Remaining visualized upper abdomen unremarkable.  Peribronchial vascular infiltrate identified in both lower lobes RIGHT greater than LEFT, minimally in RIGHT upper lobe, question bronchiolitis consistent with COPD.  Underlying emphysematous changes and central peribronchial thickening.  No segmental consolidation, pleural effusion or pneumothorax.  No acute osseous findings.  Review of the MIP images confirms the above findings.  IMPRESSION: No evidence pulmonary embolism.  COPD changes with peribronchovascular infiltrates in both lower lobes RIGHT greater than LEFT and less in RIGHT upper lobe question bronchiolitis.  Scattered atherosclerotic disease with aneurysmal dilatation of the  ascending thoracic aorta, recommendation below.  Recommend annual imaging followup by CTA or MRA. This recommendation follows 2010 ACCF/AHA/AATS/ACR/ASA/SCA/SCAI/SIR/STS/SVM Guidelines for the Diagnosis and Management of Patients with Thoracic Aortic Disease. Circulation. 2010; 121: X902-I097   Electronically Signed   By: Lavonia Dana M.D.   On: 03/23/2015 17:21    ASSESSMENT AND PLAN  1. Progressitve cough and dyspnea 2/2 COPD exacerbation  - continue to have significant dyspnea and cough, recurring issue per pt, tried to quit smoking twice, however eventually resumed smoking. Encouraged him to attempt tobacco cessation again.  2. Atypical chest pain with elevated trop  - no sign of HF on exam, CTA of chest showed no PE, however COPD changes with peribronchovascular infiltrates in  both lower lobes R > L question bronchiolities  - seen by Dr. Johnsie Cancel yesterday, felt high likelyhood of recurrence disease, planning for cath today to clarify CAD, note new onset of a-fib yesterday which has since converted to NSR on amio   - Risk and benefit of procedure explained to the patient who display clear understanding and agree to proceed. Discussed with patient possible procedural risk include bleeding, vascular injury, renal injury, arrythmia, MI, stroke and loss of limb or life.  3. Newly diagnosed proxysmal atrial fibrillation in the setting of COPD exacerbation  - CHA2DS2-Vasc score 2 (HTN, CAD)  - no BB with severe lung dx, on 10 IV diltiazem, transition to diltiazem CD 240mg  daily  - started on amio yesterday, likely can be transition to PO today, will discuss with MD regarding duration, given his severe COPD, would like to do only short course of amio to avoid pulm toxicity associated with long term use.  - consider systemic anticoagulation after cath  CAD s/p multi-link vision stent to mid RCA in 2008  4. AKI  5. HTN: uncontrolled  6. HLD  7. Tobacco abuse  8. ETOH abuse  9. 1.3 cm  pulmonary nodule on CXR, did not see significant nodule mentioned in CTA report 10. L adrenal nodule 2.3 x 1.6cm compatible with adenoma seen on CTA of chest 11. Aneurysmal dilatation of ascending thoracic aorta 4.0 x 4.1 cm 12. Hyperglycemia: A1C 5.7  Signed, Almyra Deforest PA-C Pager: [phone] As above, patient seen and examined. He remains dyspneic. No chest pain. Predominant issue appears to be COPD exacerbation. Continue pulmonary toilet. He has been scheduled for cardiac catheterization today for chest pain and elevated troponin per Dr. Johnsie Cancel. No beta blocker given COPD. Patient developed new onset atrial fibrillation and was placed on amiodarone and has converted to sinus rhythm. This is most likely related to his COPD. I would be hesitant to continue amiodarone long-term given lung disease. I will discontinue amiodarone and continue Cardizem. CHADSvasc 2. Would begin apixaban once all procedures complete. Discontinue Norvasc as he is on Cardizem. Add statin given history of coronary artery disease. Patient will need follow-up chest CT in the future 4 mildly dilated thoracic aorta. FU Dr Mare Ferrari following DC. Kirk Ruths

## 2015-03-26 DIAGNOSIS — I480 Paroxysmal atrial fibrillation: Secondary | ICD-10-CM | POA: Diagnosis not present

## 2015-03-26 LAB — COMPREHENSIVE METABOLIC PANEL
ALT: 49 U/L (ref 17–63)
ANION GAP: 9 (ref 5–15)
AST: 33 U/L (ref 15–41)
Albumin: 2.3 g/dL — ABNORMAL LOW (ref 3.5–5.0)
Alkaline Phosphatase: 59 U/L (ref 38–126)
BUN: 25 mg/dL — AB (ref 6–20)
CHLORIDE: 96 mmol/L — AB (ref 101–111)
CO2: 32 mmol/L (ref 22–32)
Calcium: 8.8 mg/dL — ABNORMAL LOW (ref 8.9–10.3)
Creatinine, Ser: 0.9 mg/dL (ref 0.61–1.24)
GFR calc Af Amer: >60 mL/min (ref >60)
GFR calc non Af Amer: >60 mL/min (ref >60)
Glucose, Bld: 139 mg/dL — ABNORMAL HIGH (ref 65–99)
POTASSIUM: 4.7 mmol/L (ref 3.5–5.1)
Sodium: 137 mmol/L (ref 135–145)
TOTAL PROTEIN: 5.5 g/dL — AB (ref 6.5–8.1)
Total Bilirubin: 0.3 mg/dL (ref 0.3–1.2)

## 2015-03-26 LAB — CBC
HEMATOCRIT: 40.9 % (ref 39.0–52.0)
Hemoglobin: 13.3 g/dL (ref 13.0–17.0)
MCH: 33.3 pg (ref 26.0–34.0)
MCHC: 32.5 g/dL (ref 30.0–36.0)
MCV: 102.3 fL — AB (ref 78.0–100.0)
Platelets: 330 10*3/uL (ref 150–400)
RBC: 4 MIL/uL — ABNORMAL LOW (ref 4.22–5.81)
RDW: 13.7 % (ref 11.5–15.5)
WBC: 14.4 10*3/uL — AB (ref 4.0–10.5)

## 2015-03-26 LAB — GLUCOSE, CAPILLARY
Glucose-Capillary: 170 mg/dL — ABNORMAL HIGH (ref 65–99)
Glucose-Capillary: 171 mg/dL — ABNORMAL HIGH (ref 65–99)
Glucose-Capillary: 137 mg/dL — ABNORMAL HIGH (ref 65–99)
Glucose-Capillary: 131 mg/dL — ABNORMAL HIGH (ref 65–99)

## 2015-03-26 LAB — MAGNESIUM: MAGNESIUM: 2.2 mg/dL (ref 1.7–2.4)

## 2015-03-26 LAB — PROTIME-INR
INR: 1.05 (ref 0.00–1.49)
Prothrombin Time: 13.9 seconds (ref 11.6–15.2)

## 2015-03-26 MED ORDER — PREDNISONE 20 MG PO TABS
60.0000 mg | ORAL_TABLET | Freq: Every day | ORAL | Status: DC
  Administered 2015-03-26 – 2015-03-27 (×2): 60 mg via ORAL
  Filled 2015-03-26 (×2): qty 3

## 2015-03-26 MED ORDER — HYDRALAZINE HCL 50 MG PO TABS
50.0000 mg | ORAL_TABLET | Freq: Three times a day (TID) | ORAL | Status: DC
  Administered 2015-03-26 – 2015-03-27 (×3): 50 mg via ORAL
  Filled 2015-03-26 (×3): qty 1

## 2015-03-26 MED ORDER — NITROGLYCERIN 0.4 MG SL SUBL: 0.4000 mg | SUBLINGUAL_TABLET | SUBLINGUAL | Status: DC | PRN

## 2015-03-26 MED ORDER — APIXABAN 5 MG PO TABS
5.0000 mg | ORAL_TABLET | Freq: Two times a day (BID) | ORAL | Status: DC
  Administered 2015-03-26 – 2015-03-27 (×3): 5 mg via ORAL
  Filled 2015-03-26 (×4): qty 1

## 2015-03-26 MED ORDER — METOPROLOL TARTRATE 12.5 MG HALF TABLET
12.5000 mg | ORAL_TABLET | Freq: Two times a day (BID) | ORAL | Status: DC
  Administered 2015-03-26 – 2015-03-27 (×3): 12.5 mg via ORAL
  Filled 2015-03-26 (×3): qty 1

## 2015-03-26 MED ORDER — ASPIRIN 81 MG PO CHEW: 81.0000 mg | CHEWABLE_TABLET | Freq: Every day | ORAL | Status: DC

## 2015-03-26 NOTE — Progress Notes (Signed)
    Subjective:  He says he is breathing better though still wheezing  Objective:  Vital Signs in the last 24 hours: Temp:  [97.3 F (36.3 C)-98.3 F (36.8 C)] 97.6 F (36.4 C) (07/09 0737) Pulse Rate:  [0-139] 100 (07/09 0737) Resp:  [6-28] 18 (07/09 0737) BP: (120-176)/(58-116) 120/58 mmHg (07/09 0737) SpO2:  [0 %-100 %] 95 % (07/09 0737) Weight:  [190 lb 11.2 oz (86.5 kg)] 190 lb 11.2 oz (86.5 kg) (07/09 0000)  Intake/Output from previous day:  Intake/Output Summary (Last 24 hours) at 03/26/15 0800 Last data filed at 03/26/15 0738  Gross per 24 hour  Intake 1527.5 ml  Output   1325 ml  Net  202.5 ml    Physical Exam: General appearance: alert, cooperative and no distress Lungs: wheezing Heart: regular rate and rhythm Extremities: Rt radial site without hematoma   Rate: 80  Rhythm: normal sinus rhythm and 9 bt WCT  Lab Results:  Recent Labs  03/25/15 0353 03/26/15 0249  WBC 16.0* 14.4*  HGB 14.1 13.3  PLT 303 330    Recent Labs  03/25/15 0353 03/26/15 0249  NA 137 137  K 4.6 4.7  CL 96* 96*  CO2 33* 32  GLUCOSE 147* 139*  BUN 22* 25*  CREATININE 0.96 0.90    Recent Labs  03/24/15 0505 03/24/15 1035  TROPONINI 0.32* 0.23*    Recent Labs  03/26/15 0249  INR 1.05    Scheduled Meds: . antiseptic oral rinse  7 mL Mouth Rinse BID  . aspirin  81 mg Oral Daily  . atorvastatin  40 mg Oral q1800  . clopidogrel  75 mg Oral Q breakfast  . diltiazem  240 mg Oral Daily  . doxycycline  100 mg Oral Q12H  . folic acid  1 mg Oral Daily  . hydrALAZINE  50 mg Oral 4 times per day  . insulin aspart  0-9 Units Subcutaneous TID WC  . ipratropium-albuterol  3 mL Nebulization Q6H  . isosorbide dinitrate  5 mg Oral BID  . multivitamin with minerals  1 tablet Oral Daily  . nicotine  21 mg Transdermal Daily  . sodium chloride  3 mL Intravenous Q12H  . thiamine  100 mg Oral Daily   Or  . thiamine  100 mg Intravenous Daily   Continuous Infusions: .  heparin 1,500 Units/hr (03/26/15 0439)   PRN Meds:.sodium chloride, acetaminophen **OR** acetaminophen, benzonatate, dextromethorphan, famotidine, hydrALAZINE, levalbuterol, LORazepam **OR** LORazepam, ondansetron **OR** ondansetron (ZOFRAN) IV, sodium chloride   Imaging: Imaging results have been reviewed   Assessment/Plan:   Principal Problem:   COPD exacerbation Active Problems:   CAD S/P RCA BMS '08, new RCA BMS 03/25/15   Acute respiratory failure with hypoxemia   NSTEMI (non-ST elevated myocardial infarction)   Alcohol abuse   PAF (paroxysmal atrial fibrillation)   Pulmonary nodule   Tobacco abuse   PLAN: Eliquis recommended by Dr Stanford Breed, stop ASA, continue Plavix, decrease Hydralazine to TID.  We will arrange for 7 day TOC follow up.   Kerin Ransom PA-C 03/26/2015, 8:00 AM [phone]  Cardiology Attending  Patient seen and examined. Agree with above. From our perspective ok for discharge though he may still have a little tuning up from COPD perspective. Agree with plans to try low dose beta 1 selective beta blocker.  Mikle Bosworth.D.

## 2015-03-26 NOTE — Progress Notes (Addendum)
CARDIAC REHAB PHASE I   PRE:  Rate/Rhythm: Sinus 92  BP:  Supine: 156/65       SaO2: 89% Room Air  MODE:  Ambulation: 600 ft   POST:  Rate/Rhythem: Sinus Tach 100  BP:    Sitting: 180/77  recheck 149/72   SaO2: 93 2l/min 92% room air  [id number] Patient ambulated without complaints on 2l/min of oxygen. Oxygen saturation remained above 90-93 on oxygen. Patient assisted to chair. Oxygen removed. Mr Omar Singh's oxygen saturation remained above 90% while sitting up. Exercise instructions, temperature precautions end point of exercise reviewed with the patient. Mr Omar Singh says he is interested in phase 2 cardiac rehab if he can obtain financial assistance. Discussed smoking cessation. Mr Omar Singh says he is willing to stop for his health. Patient given 1-800-quit now as a resource. Heart healthy diet , MI booklet reviewed. Discussed with the patient the importance of taking his plavix, sublingual nitroglycerin and when to call 911. Patient states understanding.   Whitaker, Christa See RN BSN

## 2015-03-26 NOTE — Progress Notes (Signed)
Pt had a 9 beat run of VT, pt was asleep, asymptomatic

## 2015-03-26 NOTE — Discharge Instructions (Signed)
Apixaban oral tablets What is this medicine? APIXABAN (a PIX a ban) is an anticoagulant (blood thinner). It is used to lower the chance of stroke in people with a medical condition called atrial fibrillation. It is also used to treat or prevent blood clots in the lungs or in the veins. This medicine may be used for other purposes; ask your health care provider or pharmacist if you have questions. COMMON BRAND NAME(S): Eliquis What should I tell my health care provider before I take this medicine? They need to know if you have any of these conditions: -bleeding disorders -bleeding in the brain -blood in your stools (black or tarry stools) or if you have blood in your vomit -history of stomach bleeding -kidney disease -liver disease -mechanical heart valve -an unusual or allergic reaction to apixaban, other medicines, foods, dyes, or preservatives -pregnant or trying to get pregnant -breast-feeding How should I use this medicine? Take this medicine by mouth with a glass of water. Follow the directions on the prescription label. You can take it with or without food. If it upsets your stomach, take it with food. Take your medicine at regular intervals. Do not take it more often than directed. Do not stop taking except on your doctor's advice. Stopping this medicine may increase your risk of a blot clot. Be sure to refill your prescription before you run out of medicine. Talk to your pediatrician regarding the use of this medicine in children. Special care may be needed. Overdosage: If you think you have taken too much of this medicine contact a poison control center or emergency room at once. NOTE: This medicine is only for you. Do not share this medicine with others. What if I miss a dose? If you miss a dose, take it as soon as you can. If it is almost time for your next dose, take only that dose. Do not take double or extra doses. What may interact with this medicine? This medicine may  interact with the following: -aspirin and aspirin-like medicines -certain medicines for fungal infections like ketoconazole and itraconazole -certain medicines for seizures like carbamazepine and phenytoin -certain medicines that treat or prevent blood clots like warfarin, enoxaparin, and dalteparin -clarithromycin -NSAIDs, medicines for pain and inflammation, like ibuprofen or naproxen -rifampin -ritonavir -St. John's wort This list may not describe all possible interactions. Give your health care provider a list of all the medicines, herbs, non-prescription drugs, or dietary supplements you use. Also tell them if you smoke, drink alcohol, or use illegal drugs. Some items may interact with your medicine. What should I watch for while using this medicine? Notify your doctor or health care professional and seek emergency treatment if you develop breathing problems; changes in vision; chest pain; severe, sudden headache; pain, swelling, warmth in the leg; trouble speaking; sudden numbness or weakness of the face, arm, or leg. These can be signs that your condition has gotten worse. If you are going to have surgery, tell your doctor or health care professional that you are taking this medicine. Tell your health care professional that you use this medicine before you have a spinal or epidural procedure. Sometimes people who take this medicine have bleeding problems around the spine when they have a spinal or epidural procedure. This bleeding is very rare. If you have a spinal or epidural procedure while on this medicine, call your health care professional immediately if you have back pain, numbness or tingling (especially in your legs and feet), muscle weakness, paralysis, or loss  of bladder or bowel control. Avoid sports and activities that might cause injury while you are using this medicine. Severe falls or injuries can cause unseen bleeding. Be careful when using sharp tools or knives. Consider using  an Copy. Take special care brushing or flossing your teeth. Report any injuries, bruising, or red spots on the skin to your doctor or health care professional. What side effects may I notice from receiving this medicine? Side effects that you should report to your doctor or health care professional as soon as possible: -allergic reactions like skin rash, itching or hives, swelling of the face, lips, or tongue -signs and symptoms of bleeding such as bloody or black, tarry stools; red or dark-brown urine; spitting up blood or brown material that looks like coffee grounds; red spots on the skin; unusual bruising or bleeding from the eye, gums, or nose This list may not describe all possible side effects. Call your doctor for medical advice about side effects. You may report side effects to FDA at 1-800-FDA-1088. Where should I keep my medicine? Keep out of the reach of children. Store at room temperature between 20 and 25 degrees C (68 and 77 degrees F). Throw away any unused medicine after the expiration date. NOTE: This sheet is a summary. It may not cover all possible information. If you have questions about this medicine, talk to your doctor, pharmacist, or health care provider.  2015, [employer]. (2013-05-08 11:59:24) Coronary Angiogram With Stent, Care After Refer to this sheet in the next few weeks. These instructions provide you with information on caring for yourself after your procedure. Your health care provider may also give you more specific instructions. Your treatment has been planned according to current medical practices, but problems sometimes occur. Call your health care provider if you have any problems or questions after your procedure.  WHAT TO EXPECT AFTER THE PROCEDURE  The insertion site may be tender for a few days after your procedure. HOME CARE INSTRUCTIONS   Take medicines only as directed by your health care provider. Blood thinners may be prescribed  after your procedure to improve blood flow through the stent.  Change any bandages (dressings) as directed by your health care provider.   Check your insertion site every day for redness, swelling, or fluid leaking from the insertion.   Do not take baths, swim, or use a hot tub until your health care provider approves. You may shower. Pat the insertion area dry. Do not rub the insertion area with a washcloth or towel.   Eat a heart-healthy diet. This should include plenty of fresh fruits and vegetables. Meat should be lean cuts. Avoid the following types of food:   Food that is high in salt.   Canned or highly processed food.   Food that is high in saturated fat or sugar.   Fried food.   Make any other lifestyle changes recommended by your health care provider. This may include:   Not using any tobacco products including cigarettes, chewing tobacco, or electronic cigarettes.  Managing your weight.   Getting regular exercise.   Managing your blood pressure.   Limiting your alcohol intake.   Managing other health problems, such as diabetes.   If you need an MRI after your heart stent was placed, be sure to tell the health care provider who orders the MRI that you have a heart stent.   Keep all follow-up visits as directed by your health care provider.  [place]  IF:   You develop chest pain, shortness of breath, feel faint, or pass out.  You have bleeding, swelling larger than a walnut, or drainage from the catheter insertion site.  You develop pain, discoloration, coldness, or severe bruising in the leg or arm that held the catheter.  You develop bleeding from any other place such as from the bowels. There may be bright red blood in the urine or stools, or it may appear as black, tarry stools.  You have a fever or chills. MAKE SURE YOU:  Understand these instructions.  Will watch your condition.  Will get help right away if you are  not doing well or get worse. Document Released: 03/23/2005 Document Revised: 01/18/2014 Document Reviewed: 02/04/2013 [facility] Patient Information 2015 [place], [place]. This information is not intended to replace advice given to you by your health care provider. Make sure you discuss any questions you have with your health care provider. Atrial Fibrillation Atrial fibrillation is a type of irregular heart rhythm (arrhythmia). During atrial fibrillation, the upper chambers of the heart (atria) quiver continuously in a chaotic pattern. This causes an irregular and often rapid heart rate.  Atrial fibrillation is the result of the heart becoming overloaded with disorganized signals that tell it to beat. These signals are normally released one at a time by a part of the right atrium called the sinoatrial node. They then travel from the atria to the lower chambers of the heart (ventricles), causing the atria and ventricles to contract and pump blood as they pass. In atrial fibrillation, parts of the atria outside of the sinoatrial node also release these signals. This results in two problems. First, the atria receive so many signals that they do not have time to fully contract. Second, the ventricles, which can only receive one signal at a time, beat irregularly and out of rhythm with the atria.  There are three types of atrial fibrillation:   Paroxysmal. Paroxysmal atrial fibrillation starts suddenly and stops on its own within a week.  Persistent. Persistent atrial fibrillation lasts for more than a week. It may stop on its own or with treatment.  Permanent. Permanent atrial fibrillation does not go away. Episodes of atrial fibrillation may lead to permanent atrial fibrillation. Atrial fibrillation can prevent your heart from pumping blood normally. It increases your risk of stroke and can lead to heart failure.  CAUSES   Heart conditions, including a heart attack, heart failure, coronary artery disease,  and heart valve conditions.   Inflammation of the sac that surrounds the heart (pericarditis).  Blockage of an artery in the lungs (pulmonary embolism).  Pneumonia or other infections.  Chronic lung disease.  Thyroid problems, especially if the thyroid is overactive (hyperthyroidism).  Caffeine, excessive alcohol use, and use of some illegal drugs.   Use of some medicines, including certain decongestants and diet pills.  Heart surgery.   Birth defects.  Sometimes, no cause can be found. When this happens, the atrial fibrillation is called lone atrial fibrillation. The risk of complications from atrial fibrillation increases if you have lone atrial fibrillation and you are age 8 years or older. RISK FACTORS  Heart failure.  Coronary artery disease.  Diabetes mellitus.   High blood pressure (hypertension).   Obesity.   Other arrhythmias.   Increased age. SIGNS AND SYMPTOMS   A feeling that your heart is beating rapidly or irregularly.   A feeling of discomfort or pain in your chest.   Shortness of breath.   Sudden light-headedness  or weakness.   Getting tired easily when exercising.   Urinating more often than normal (mainly when atrial fibrillation first begins).  In paroxysmal atrial fibrillation, symptoms may start and suddenly stop. DIAGNOSIS  Your health care provider may be able to detect atrial fibrillation when taking your pulse. Your health care provider may have you take a test called an ambulatory electrocardiogram (ECG). An ECG records your heartbeat patterns over a 24-hour period. You may also have other tests, such as:  Transthoracic echocardiogram (TTE). During echocardiography, sound waves are used to evaluate how blood flows through your heart.  Transesophageal echocardiogram (TEE).  Stress test. There is more than one type of stress test. If a stress test is needed, ask your health care provider about which type is best for  you.  Chest X-ray exam.  Blood tests.  Computed tomography (CT). TREATMENT  Treatment may include:  Treating any underlying conditions. For example, if you have an overactive thyroid, treating the condition may correct atrial fibrillation.  Taking medicine. Medicines may be given to control a rapid heart rate or to prevent blood clots, heart failure, or a stroke.  Having a procedure to correct the rhythm of the heart:  Electrical cardioversion. During electrical cardioversion, a controlled, low-energy shock is delivered to the heart through your skin. If you have chest pain, very low blood pressure, or sudden heart failure, this procedure may need to be done as an emergency.  Catheter ablation. During this procedure, heart tissues that send the signals that cause atrial fibrillation are destroyed.  Surgical ablation. During this surgery, thin lines of heart tissue that carry the abnormal signals are destroyed. This procedure can either be an open-heart surgery or a minimally invasive surgery. With the minimally invasive surgery, small cuts are made to access the heart instead of a large opening.  Pulmonary venous isolation. During this surgery, tissue around the veins that carry blood from the lungs (pulmonary veins) is destroyed. This tissue is thought to carry the abnormal signals. HOME CARE INSTRUCTIONS   Take medicines only as directed by your health care provider. Some medicines can make atrial fibrillation worse or recur.  If blood thinners were prescribed by your health care provider, take them exactly as directed. Too much blood-thinning medicine can cause bleeding. If you take too little, you will not have the needed protection against stroke and other problems.  Perform blood tests at home if directed by your health care provider. Perform blood tests exactly as directed.  Quit smoking if you smoke.  Do not drink alcohol.  Do not drink caffeinated beverages such as coffee,  soda, and some teas. You may drink decaffeinated coffee, soda, or tea.   Maintain a healthy weight.Do not use diet pills unless your health care provider approves. They may make heart problems worse.   Follow diet instructions as directed by your health care provider.  Exercise regularly as directed by your health care provider.  Keep all follow-up visits as directed by your health care provider. This is important. PREVENTION  The following substances can cause atrial fibrillation to recur:   Caffeinated beverages.  Alcohol.  Certain medicines, especially those used for breathing problems.  Certain herbs and herbal medicines, such as those containing ephedra or ginseng.  Illegal drugs, such as cocaine and amphetamines. Sometimes medicines are given to prevent atrial fibrillation from recurring. Proper treatment of any underlying condition is also important in helping prevent recurrence.  SEEK MEDICAL CARE IF:  You notice a change in  the rate, rhythm, or strength of your heartbeat.  You suddenly begin urinating more frequently.  You tire more easily when exerting yourself or exercising. SEEK IMMEDIATE MEDICAL CARE IF:   You have chest pain, abdominal pain, sweating, or weakness.  You feel nauseous.  You have shortness of breath.  You suddenly have swollen feet and ankles.  You feel dizzy.  Your face or limbs feel numb or weak.  You have a change in your vision or speech. MAKE SURE YOU:   Understand these instructions.  Will watch your condition.  Will get help right away if you are not doing well or get worse. Document Released: 09/03/2005 Document Revised: 01/18/2014 Document Reviewed: 10/14/2012 [facility] Patient Information 2015 [place], [place]. This information is not intended to replace advice given to you by your health care provider. Make sure you discuss any questions you have with your health care provider. Radial Site Care Refer to this sheet in the  next few weeks. These instructions provide you with information on caring for yourself after your procedure. Your caregiver may also give you more specific instructions. Your treatment has been planned according to current medical practices, but problems sometimes occur. Call your caregiver if you have any problems or questions after your procedure. HOME CARE INSTRUCTIONS  You may shower the day after the procedure.Remove the bandage (dressing) and gently wash the site with plain soap and water.Gently pat the site dry.  Do not apply powder or lotion to the site.  Do not submerge the affected site in water for 3 to 5 days.  Inspect the site at least twice daily.  Do not flex or bend the affected arm for 24 hours.  No lifting over 5 pounds (2.3 kg) for 5 days after your procedure.  Do not drive home if you are discharged the same day of the procedure. Have someone else drive you.  You may drive 24 hours after the procedure unless otherwise instructed by your caregiver.  Do not operate machinery or power tools for 24 hours.  A responsible adult should be with you for the first 24 hours after you arrive home. What to expect:  Any bruising will usually fade within 1 to 2 weeks.  Blood that collects in the tissue (hematoma) may be painful to the touch. It should usually decrease in size and tenderness within 1 to 2 weeks. SEEK IMMEDIATE MEDICAL CARE IF:  You have unusual pain at the radial site.  You have redness, warmth, swelling, or pain at the radial site.  You have drainage (other than a small amount of blood on the dressing).  You have chills.  You have a fever or persistent symptoms for more than 72 hours.  You have a fever and your symptoms suddenly get worse.  Your arm becomes pale, cool, tingly, or numb.  You have heavy bleeding from the site. Hold pressure on the site. Document Released: 10/06/2010 Document Revised: 11/26/2011 Document Reviewed: 10/06/2010 [facility]  Patient Information 2015 [place], [place]. This information is not intended to replace advice given to you by your health care provider. Make sure you discuss any questions you have with your health care provider.  -------------------------------------------------- Information on my medicine - ELIQUIS (apixaban)  This medication education was reviewed with me or my healthcare representative as part of my discharge preparation.  The pharmacist that spoke with me during my hospital stay was:  Myrene Galas, [facility]  Why was Eliquis prescribed for you? Eliquis was prescribed for you to reduce  the risk of a blood clot forming that can cause a stroke if you have a medical condition called atrial fibrillation (a type of irregular heartbeat).  What do You need to know about Eliquis ? Take your Eliquis TWICE DAILY - one tablet in the morning and one tablet in the evening with or without food. If you have difficulty swallowing the tablet whole please discuss with your pharmacist how to take the medication safely.  Take Eliquis exactly as prescribed by your doctor and DO NOT stop taking Eliquis without talking to the doctor who prescribed the medication.  Stopping may increase your risk of developing a stroke.  Refill your prescription before you run out.  After discharge, you should have regular check-up appointments with your healthcare provider that is prescribing your Eliquis.  In the future your dose may need to be changed if your kidney function or weight changes by a significant amount or as you get older.  What do you do if you miss a dose? If you miss a dose, take it as soon as you remember on the same day and resume taking twice daily.  Do not take more than one dose of ELIQUIS at the same time to make up a missed dose.  Important Safety Information A possible side effect of Eliquis is bleeding. You should call your healthcare provider right away if you experience any of the  following: ? Bleeding from an injury or your nose that does not stop. ? Unusual colored urine (red or dark brown) or unusual colored stools (red or black). ? Unusual bruising for unknown reasons. ? A serious fall or if you hit your head (even if there is no bleeding).  Some medicines may interact with Eliquis and might increase your risk of bleeding or clotting while on Eliquis. To help avoid this, consult your healthcare provider or pharmacist prior to using any new prescription or non-prescription medications, including herbals, vitamins, non-steroidal anti-inflammatory drugs (NSAIDs) and supplements.  This website has more information on Eliquis (apixaban): [email or web address]

## 2015-03-26 NOTE — Progress Notes (Signed)
NAS WAFER DTO:[id number] DOB: 05/15/1951 DOA: 03/23/2015 PCP: No PCP Per Patient  Brief narrative:  64 y/o ? prior CAD ? 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 Nodule and L Suprarenal adrenal mass pending further characterization Cardiology consulted as ? troponin Hospital stay complicated by Afib + RVR-Chad score ~ 2 Had a  th 7/8 showing severe CAD and stent  Past medical history-As per Problem list Chart reviewed as below-   Consultants:  Cardiology  Procedures:  Cath 7/8  Antibiotics:     Subjective   Fair Bored wants o watchNascar Q's about Adrenal mass Breathing is better "i wanna go home"   Objective    Interim History:   Telemetry: Tele nsr   Objective: Filed Vitals:   03/26/15 0400 03/26/15 0454 03/26/15 0624 03/26/15 0737  BP: 143/74  142/66 120/58  Pulse: 83 88  100  Temp: 97.5 F (36.4 C)   97.6 F (36.4 C)  TempSrc: Oral   Axillary  Resp: 20 20  18   Height:      Weight:      SpO2: 97% 93%  95%    Intake/Output Summary (Last 24 hours) at 03/26/15 0803 Last data filed at 03/26/15 0738  Gross per 24 hour  Intake 1527.5 ml  Output   1325 ml  Net  202.5 ml    Exam:  General: eomi, pleasant male, .  Wheeze ? Cardiovascular: no pallor, no ict, s1 s2 tachy slighlty Respiratory: wheezy+ but improved, no rales nor rhonchi Abdomen: soft n nd Skin no le edema Neuro intact  Data Reviewed: Basic Metabolic Panel:  Recent Labs Lab 03/23/15 1350 03/24/15 0505 03/25/15 0353 03/26/15 0249  NA 137 137 137 137  K 4.2 5.1 4.6 4.7  CL 92* 97* 96* 96*  CO2 32 32 33* 32   GLUCOSE 163* 126* 147* 139*  BUN 19 18 22* 25*  CREATININE 1.56* 0.97 0.96 0.90  CALCIUM 9.6 9.2 9.3 8.8*  PHOS  --   --  4.1  --    Liver Function Tests:  Recent Labs Lab 03/23/15 1620 03/25/15 0353 03/26/15 0249  AST 23  --  33  ALT 20  --  49  ALKPHOS 75  --  59  BILITOT 0.6  --  0.3  PROT 7.7  --  5.5*  ALBUMIN 3.1* 2.6* 2.3*   No results for input(s): LIPASE, AMYLASE in the last 168 hours. No results for input(s): AMMONIA in the last 168 hours. CBC:  Recent Labs Lab 03/23/15 1350 03/24/15 0505 03/25/15 0353 03/26/15 0249  WBC 13.8* 11.0* 16.0* 14.4*  HGB 15.7 14.1 14.1 13.3  HCT 48.0 43.9 43.9 40.9  MCV 103.0* 102.6* 102.6* 102.3*  PLT 265 273 303 330   Cardiac Enzymes:  Recent Labs Lab 03/23/15 1620 03/23/15 2208 03/24/15 0505 03/24/15 1035  TROPONINI 0.52* 0.43* 0.32* 0.23*   BNP: Invalid input(s): POCBNP CBG:  Recent Labs Lab 03/25/15 0729 03/25/15 1133 03/25/15 1840 03/25/15 2117 03/26/15 0616  GLUCAP 163* 116* 137* 146* 170*    No  results found for this or any previous visit (from the past 240 hour(s)).   Studies:              All Imaging reviewed and is as per above notation   Scheduled Meds: . antiseptic oral rinse  7 mL Mouth Rinse BID  . atorvastatin  40 mg Oral q1800  . clopidogrel  75 mg Oral Q breakfast  . diltiazem  240 mg Oral Daily  . doxycycline  100 mg Oral Q12H  . folic acid  1 mg Oral Daily  . hydrALAZINE  50 mg Oral 3 times per day  . insulin aspart  0-9 Units Subcutaneous TID WC  . ipratropium-albuterol  3 mL Nebulization Q6H  . metoprolol tartrate  12.5 mg Oral BID  . multivitamin with minerals  1 tablet Oral Daily  . nicotine  21 mg Transdermal Daily  . predniSONE  60 mg Oral QAC breakfast  . sodium chloride  3 mL Intravenous Q12H  . thiamine  100 mg Oral Daily   Or  . thiamine  100 mg Intravenous Daily   Continuous Infusions: . heparin 1,500 Units/hr (03/26/15 0439)      Assessment/Plan:  1. AECOPD -Solumedrol 60 q6 for 6 doses changed to Po Prednisone 60 as making slow but steady improvement -Non-purulent sputum?   -would still continue po doxycycline for COPD exacerbation per ISDA guidelines -Duonebs per RT  2. P AFib + RVR=-CHad2Vasc2=2 -Appreciate Cardiology input -converted to NSR 1830 03/24/15 -Amio d/c 7/8  -continue Cardizem 240 CD -Start Metoprolol 12.5 bid as 9 beats Vtach 7/9 -changed from Heparin gtt --->Elliquis 7/9  3.  CAD s/p Stent DES to RCA this admission 03/25/15 per Dr. Angelena Form -rec'd ASA and started on Hep per Cardiology -Plavix for DES  3.         Hld -   ASCVD risk already elevated - High intensitying Lipitor 40 mg daily for 2/2 preventon  4. EToH -stopped drinking 6 days ago to cardiology-stopped drinking 9 days ago when i asked him  -CIWA is 0 -d/c CIWA 7/8  5.  Htn urgency -add hydralazine scheduled po 50 q6-->q8 per cards 7/9 -add isordil 5 bid in addition--d/c by cards 7/9 -defer diuresis for now to cardiology  6.   Smoker   -needs Nicotine patch -contemplative-nursing to provide re: cessation  7. AKI -resolved -consider diuresis  8. Impaired gluc tolerance -SSI coverage for now -will taper steroids and expect hyperglycemia will improve -no Long acting at present  L Adrenal adenoma + Asc Aorta dilatation--both are important but non-salient to current hospitalization I will briefly discuss next steps with him once acute issues resovle   Code Status: full Family Communication:  D/w patient alone Disposition Plan: inpatient   Verneita Griffes, MD  [facility] Pager [phone] 03/26/2015, 8:03 AM    LOS: 3 days

## 2015-03-26 NOTE — Progress Notes (Addendum)
ANTICOAGULATION CONSULT NOTE - Initial Consult  Pharmacy Consult for apixaban Indication: a fib  No Known Allergies  Patient Measurements: Height: 6' (182.9 cm) Weight: 190 lb 11.2 oz (86.5 kg) IBW/kg (Calculated) : 77.6  Vital Signs: Temp: 97.6 F (36.4 C) (07/09 0737) Temp Source: Axillary (07/09 0737) BP: 120/58 mmHg (07/09 0737) Pulse Rate: 100 (07/09 0737)  Labs:  Recent Labs  03/23/15 2208 03/24/15 0505 03/24/15 0758 03/24/15 1035 03/25/15 0353 03/26/15 0249  HGB  --  14.1  --   --  14.1 13.3  HCT  --  43.9  --   --  43.9 40.9  PLT  --  273  --   --  303 330  LABPROT  --   --   --   --  14.6  14.8 13.9  INR  --   --   --   --  1.12  1.14 1.05  HEPARINUNFRC 0.21*  --  0.31  --  0.43  --   CREATININE  --  0.97  --   --  0.96 0.90  TROPONINI 0.43* 0.32*  --  0.23*  --   --     Estimated Creatinine Clearance: 92.2 mL/min (by C-G formula based on Cr of 0.9).   Medical History: Past Medical History  Diagnosis Date  . CAD (coronary artery disease)     a. s/p multi-link vision stent to the mid RCA in 2007 at [facility].  . Alcohol abuse   . Tobacco abuse   . Hypertension   . Hyperlipidemia   . Panic attack   . Acute exacerbation of chronic obstructive pulmonary disease (COPD)     [name] 03/23/2015  . Myocardial infarction 2008    /notes 03/23/2015    Medications:  Prescriptions prior to admission  Medication Sig Dispense Refill Last Dose  . diphenhydramine-acetaminophen (TYLENOL PM) 25-500 MG TABS Take 3 tablets by mouth at bedtime as needed (sleep).   03/22/2015 at Unknown time    Assessment: 64 y/o M w/ hx of CAD presents with CP and is s/p cath to begin apixaban. Pt on Plavix and ASA w/ potential plans to d/c ASA  Goal of Therapy:  Monitor platelets by anticoagulation protocol: Yes   Plan:  Apixaban 5 mg po daily Will provide patient education  Angela Burke, PharmD Pharmacy Resident 03/26/2015,8:48 AM

## 2015-03-27 LAB — GLUCOSE, CAPILLARY: GLUCOSE-CAPILLARY: 122 mg/dL — AB (ref 65–99)

## 2015-03-27 MED ORDER — ATORVASTATIN CALCIUM 40 MG PO TABS: 40.0000 mg | ORAL_TABLET | Freq: Every day | ORAL | Status: DC

## 2015-03-27 MED ORDER — APIXABAN 5 MG PO TABS: 5.0000 mg | ORAL_TABLET | Freq: Two times a day (BID) | ORAL | Status: DC

## 2015-03-27 MED ORDER — MOMETASONE FURO-FORMOTEROL FUM 200-5 MCG/ACT IN AERO: 2.0000 | INHALATION_SPRAY | Freq: Every day | RESPIRATORY_TRACT | Status: DC

## 2015-03-27 MED ORDER — ALBUTEROL SULFATE HFA 108 (90 BASE) MCG/ACT IN AERS
2.0000 | INHALATION_SPRAY | Freq: Four times a day (QID) | RESPIRATORY_TRACT | Status: DC | PRN
  Filled 2015-03-27: qty 6.7

## 2015-03-27 MED ORDER — TIOTROPIUM BROMIDE MONOHYDRATE 18 MCG IN CAPS: 18.0000 ug | ORAL_CAPSULE | Freq: Two times a day (BID) | RESPIRATORY_TRACT | Status: DC

## 2015-03-27 MED ORDER — NICOTINE 21 MG/24HR TD PT24: 21.0000 mg | MEDICATED_PATCH | Freq: Every day | TRANSDERMAL | Status: DC

## 2015-03-27 MED ORDER — DILTIAZEM HCL ER COATED BEADS 240 MG PO CP24
240.0000 mg | ORAL_CAPSULE | Freq: Every day | ORAL | Status: DC
Start: 2015-03-27 — End: 2018-12-07

## 2015-03-27 MED ORDER — ALBUTEROL SULFATE HFA 108 (90 BASE) MCG/ACT IN AERS: 2.0000 | INHALATION_SPRAY | Freq: Four times a day (QID) | RESPIRATORY_TRACT | Status: DC | PRN

## 2015-03-27 MED ORDER — HYDRALAZINE HCL 50 MG PO TABS: 50.0000 mg | ORAL_TABLET | Freq: Three times a day (TID) | ORAL | Status: DC

## 2015-03-27 MED ORDER — PREDNISONE 50 MG PO TABS: 50.0000 mg | ORAL_TABLET | Freq: Every day | ORAL | Status: DC

## 2015-03-27 MED ORDER — CLOPIDOGREL BISULFATE 75 MG PO TABS: 75.0000 mg | ORAL_TABLET | Freq: Every day | ORAL | Status: DC

## 2015-03-27 MED ORDER — METOPROLOL TARTRATE 25 MG PO TABS: 12.5000 mg | ORAL_TABLET | Freq: Two times a day (BID) | ORAL | Status: DC

## 2015-03-27 NOTE — Progress Notes (Signed)
Physician Discharge Summary  Omar Singh JKK:[id number] DOB: December 02, 1950 DOA: 03/23/2015  PCP: No PCP Per Patient  Admit date: 03/23/2015 Discharge date: 03/27/2015  Time spent: 35 minutes  Recommendations for Outpatient Follow-up:  1. Patient will need follow up with Cardiology ~ 2-3 weeks New meds Discharge  - apixaban (ELIQUIS) 5 MG TABS tablet  - atorvastatin (LIPITOR) 40 MG tablet - clopidogrel (PLAVIX) 75 MG tablet  - diltiazem (CARDIZEM CD) 240 MG 24 hr capsule - hydrALAZINE (APRESOLINE) 50 MG tablet  - metoprolol tartrate (LOPRESSOR) 25 MG tablet [1/2 tab nid] - nicotine (NICODERM CQ - DOSED IN MG/24 HOURS) 21 mg/24hr patch  - predniSONE (DELTASONE) 50 MG tablet [taper 5 days]  -also filled Albuterol/Dulera and Tiotropium inhalers and was educated regarding their use prior to discharge 2. Continued input regarding smoking cessation as needed as an outpatient 3. Would recommend checking chem 12, CBC 1 week 4. No need for taper off of steroids 5. Consider consolidation of Toprol to XL formulation moving forward if tolerated fairly well 6. Will need reinforcement regarding use of inhalers 7. He has a left adrenal adenoma that will need some characterization as an outpatient potentially with MRI once his acute issues settle down and this may need to be biopsied eventually  Discharge Diagnoses:  Principal Problem:   COPD exacerbation Active Problems:   CAD S/P RCA BMS '08, new RCA BMS 03/25/15   Pulmonary nodule   Alcohol abuse   Tobacco abuse   Acute respiratory failure with hypoxemia   NSTEMI (non-ST elevated myocardial infarction)   PAF (paroxysmal atrial fibrillation)   Discharge Condition: Alert oriented pleasant  Diet recommendation: Heart healthy low-salt  Filed Weights   03/24/15 0600 03/26/15 0000 03/27/15 0617  Weight: 81 kg (178 lb 9.2 oz) 86.5 kg (190 lb 11.2 oz) 81.9 kg (180 lb 8.9 oz)    History of present illness:    64 y/o ? prior CAD ? 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 Nodule and L Suprarenal adrenal mass pending further characterization Cardiology consulted as ? troponin Hospital stay complicated by Afib + RVR-Chad score ~ 2 and he was started transiently on heparin and then based on Elliquis Had acath 7/8 showing severe CAD and stent was placed as well necessitating the use of Plavix   Hospital Course:   1. AECOPD -Solumedrol 60 q6 for 6 doses changed to Po Prednisone 50  -Non-purulent sputum?  -Completed 4 days of po doxycycline for COPD exacerbation per ISDA guidelines -Duonebs per RT -Transitioned to albuterol, tiotropium, Duklera on d/c and will need re-inforcement and teaching as OP  Afib + RVR CHad2Vasc2> 3 -Appreciate Cardiology input -converted to NSR 1830 03/24/15 -Amio d/c 7/8  -continue Cardizem 240 CD -Start Metoprolol 12.5 bid as 9 beats Vtach 7/9 -changed from Heparin gtt --->Elliquis 7/9  3. CAD s/p Stent DES to RCA this admission 03/25/15 per Dr. Angelena Form -rec'd ASA and started on Hep per Cardiology -Plavix for DES--duration to be delineated as per cardiology as an outpatient  3. Hld - ASCVD risk  already elevated - High intensitying Lipitor 40 mg daily for 2/2 prevention  4.EToH -stopped drinking 6 days ago to cardiology-stopped drinking 9 days ago when i asked him  -CIWA is 0 -d/c CIWA 7/8  5. Htn urgency -add hydralazine scheduled po 50 q6-->q8 per cards 7/9 -add isordil 5 bid in addition--d/c by cards 7/9 -Did not seem to need diuresis  cardiology  6. Smoker -needs Nicotine  patch on discharge -contemplative-nursing to provide re: cessation  7.AKI -resolved -consider diuresis  8. Impaired gluc tolerance -SSI coverage for now -will taper steroids and expect hyperglycemia will improve -no Long acting at present  L Adrenal adenoma + Asc Aorta dilatation--both are important but non-salient to current hospitalization I will briefly discuss next steps with him once acute issues resovle  Consultants: 2. Cardiology  Procedures:  Cath 7/8   Discharge Exam: Filed Vitals:   03/27/15 0617  BP: 146/76  Pulse:   Temp:   Resp:     General: Alert pleasant oriented Cardiovascular: S1-S2 no murmur rub or gallop Respiratory: Mild wheeze no rales or rhonchi  Discharge Instructions   Discharge Instructions    Amb Referral to Cardiac Rehabilitation    Complete by:  As directed   Congestive Heart Failure: If diagnosis is Heart Failure, patient MUST meet each of the CMS criteria: 1. Left Ventricular Ejection Fraction </= 35% 2. NYHA class II-IV symptoms despite being on optimal heart failure therapy for at least 6 weeks. 3. Stable = have not had a recent (<6 weeks) or planned (<6 months) major cardiovascular hospitalization or procedure  Program Details: - Physician supervised classes - 1-3 classes per week over a 12-18 week period, generally for a total of 36 sessions  Physician Certification: I certify that the above Cardiac Rehabilitation treatment is medically necessary and is medically approved by me for treatment of this patient. The patient is willing and cooperative, able to ambulate and medically stable to participate in exercise rehabilitation. The participant's progress and Individualized Treatment Plan will be reviewed by the Medical Director, Cardiac Rehab staff and as indicated by the Referring/Ordering Physician.  Diagnosis:  Myocardial Infarction     Diet - low sodium heart healthy    Complete by:  As directed      Discharge  instructions    Complete by:  As directed   You will be started on multiple new medications for your heart conditions Please take them carefully as directed You will be meeting a new Primary Phsycian Dr. Adrian Blackwater who will take excellent car eof you and coordinate your care-please see her on 7/18 Please quit smoking and also consider a moderate intesnity exercise regimen Cardiac Rehab will work with you on this Take care and good luck     Increase activity slowly    Complete by:  As directed           Current Discharge Medication List    START taking these medications   Details  apixaban (ELIQUIS) 5 MG TABS tablet Take 1 tablet (5 mg total) by mouth 2 (two) times daily. Qty: 60 tablet, Refills: 0    atorvastatin (LIPITOR) 40 MG tablet Take 1 tablet (40 mg total) by mouth daily at 6 PM. Qty: 30 tablet, Refills: 0    clopidogrel (PLAVIX) 75 MG tablet Take 1 tablet (75 mg total) by mouth daily with breakfast. Qty: 30 tablet, Refills: 0    diltiazem (CARDIZEM CD) 240 MG 24 hr capsule Take 1 capsule (240 mg total) by mouth daily.  Qty: 30 capsule, Refills: 0    hydrALAZINE (APRESOLINE) 50 MG tablet Take 1 tablet (50 mg total) by mouth every 8 (eight) hours. Qty: 90 tablet, Refills: 0    metoprolol tartrate (LOPRESSOR) 25 MG tablet Take 0.5 tablets (12.5 mg total) by mouth 2 (two) times daily. Qty: 60 tablet, Refills: 0    nicotine (NICODERM CQ - DOSED IN MG/24 HOURS) 21 mg/24hr patch Place 1 patch (21 mg total) onto the skin daily. Qty: 28 patch, Refills: 0    predniSONE (DELTASONE) 50 MG tablet Take 1 tablet (50 mg total) by mouth daily before breakfast. Qty: 5 tablet, Refills: 0      CONTINUE these medications which have NOT CHANGED   Details  diphenhydramine-acetaminophen (TYLENOL PM) 25-500 MG TABS Take 3 tablets by mouth at bedtime as needed (sleep).       No Known Allergies Follow-up Information    Follow up with [facility]     On  04/04/2015.   Why:  Hospital follow-up appointment on 04/04/15 at 4:15 pm with Dr. Adrian Blackwater.   Contact information:   [address] [place] [address] [phone]      Follow up with Warren Danes, MD.   Specialty:  Cardiology   Why:  office will contact you   Contact information:   [address] [place] [address] [phone]        The results of significant diagnostics from this hospitalization (including imaging, microbiology, ancillary and laboratory) are listed below for reference.    Significant Diagnostic Studies: Dg Chest 2 View  03/25/2015   CLINICAL DATA:  Pneumonia and shortness of breath.  EXAM: CHEST  2 VIEW  COMPARISON:  03/23/2015  FINDINGS: Heart and mediastinum are within normal limits. Lungs are clear without airspace disease or edema. Trachea is midline. No acute bone abnormality.  IMPRESSION: Stable chest radiograph findings without focal disease. The subtle parenchymal densities seen on the recent CT are too small to be characterized on plain radiographs.   Electronically Signed   By: Markus Daft M.D.   On: 03/25/2015 08:02   Dg Chest 2 View  03/23/2015   CLINICAL DATA:  Left-sided chest pain for 1 week, more severe today  EXAM: CHEST  2 VIEW  COMPARISON:  03/13/2006  FINDINGS: The heart size and vascular pattern are normal. There is no infiltrate consolidation or edema. There is a 1.3 cm nodular opacity over the left lateral costophrenic angle which is not seen on the prior study.  IMPRESSION: Recommend CT thorax for possible pulmonary nodule.  These results will be called to the ordering clinician or representative by the Radiologist Assistant, and communication documented in the PACS or zVision Dashboard.   Electronically Signed   By: Skipper Cliche M.D.   On: 03/23/2015 13:34   Ct Angio Chest Pe W/cm &/or Wo Cm  03/23/2015   CLINICAL DATA:  Chest pain for 4 weeks worse today, cough, shortness of breath, history smoking,  hypertension, hyperlipidemia, coronary artery disease post angioplasty  EXAM: CT ANGIOGRAPHY CHEST WITH CONTRAST  TECHNIQUE: Multidetector CT imaging of the chest was performed using the standard protocol during bolus administration of intravenous contrast. Multiplanar CT image reconstructions and MIPs were obtained to evaluate the vascular anatomy.  CONTRAST:  117mL OMNIPAQUE IOHEXOL 350 MG/ML SOLN IV  COMPARISON:  None  FINDINGS: Scattered atherosclerotic calcifications aorta, coronary arteries, and proximal great vessels.  Minimal aneurysmal dilatation of ascending thoracic aorta 4.0 x  4.1 cm image 47.  Scattered respiratory motion artifacts which limit exam.  Dilated central pulmonary arteries question pulmonary arterial hypertension.  Pulmonary arteries well opacified and grossly patent.  No definite evidence of pulmonary embolism.  Few scattered normal sized mediastinal lymph nodes.  Low-attenuation LEFT adrenal nodule 2.3 x 1.6 cm compatible with adenoma.  Remaining visualized upper abdomen unremarkable.  Peribronchial vascular infiltrate identified in both lower lobes RIGHT greater than LEFT, minimally in RIGHT upper lobe, question bronchiolitis consistent with COPD.  Underlying emphysematous changes and central peribronchial thickening.  No segmental consolidation, pleural effusion or pneumothorax.  No acute osseous findings.  Review of the MIP images confirms the above findings.  IMPRESSION: No evidence pulmonary embolism.  COPD changes with peribronchovascular infiltrates in both lower lobes RIGHT greater than LEFT and less in RIGHT upper lobe question bronchiolitis.  Scattered atherosclerotic disease with aneurysmal dilatation of the ascending thoracic aorta, recommendation below.  Recommend annual imaging followup by CTA or MRA. This recommendation follows 2010 ACCF/AHA/AATS/ACR/ASA/SCA/SCAI/SIR/STS/SVM Guidelines for the Diagnosis and Management of Patients with Thoracic Aortic Disease. Circulation.  2010; 121: D664-Q034   Electronically Signed   By: Lavonia Dana M.D.   On: 03/23/2015 17:21    Microbiology: No results found for this or any previous visit (from the past 240 hour(s)).   Labs: Basic Metabolic Panel:  Recent Labs Lab 03/23/15 1350 03/24/15 0505 03/25/15 0353 03/26/15 0249  NA 137 137 137 137  K 4.2 5.1 4.6 4.7  CL 92* 97* 96* 96*  CO2 32 32 33* 32  GLUCOSE 163* 126* 147* 139*  BUN 19 18 22* 25*  CREATININE 1.56* 0.97 0.96 0.90  CALCIUM 9.6 9.2 9.3 8.8*  MG  --   --   --  2.2  PHOS  --   --  4.1  --    Liver Function Tests:  Recent Labs Lab 03/23/15 1620 03/25/15 0353 03/26/15 0249  AST 23  --  33  ALT 20  --  49  ALKPHOS 75  --  59  BILITOT 0.6  --  0.3  PROT 7.7  --  5.5*  ALBUMIN 3.1* 2.6* 2.3*   No results for input(s): LIPASE, AMYLASE in the last 168 hours. No results for input(s): AMMONIA in the last 168 hours. CBC:  Recent Labs Lab 03/23/15 1350 03/24/15 0505 03/25/15 0353 03/26/15 0249  WBC 13.8* 11.0* 16.0* 14.4*  HGB 15.7 14.1 14.1 13.3  HCT 48.0 43.9 43.9 40.9  MCV 103.0* 102.6* 102.6* 102.3*  PLT 265 273 303 330   Cardiac Enzymes:  Recent Labs Lab 03/23/15 1620 03/23/15 2208 03/24/15 0505 03/24/15 1035  TROPONINI 0.52* 0.43* 0.32* 0.23*   BNP: BNP (last 3 results)  Recent Labs  03/23/15 1350  BNP 495.2*    ProBNP (last 3 results) No results for input(s): PROBNP in the last 8760 hours.  CBG:  Recent Labs Lab 03/26/15 0616 03/26/15 1130 03/26/15 1632 03/26/15 2110 03/27/15 0717  GLUCAP 170* 171* 137* 131* 122*       Signed:  Nita Sells  [facility] 03/27/2015, 9:31 AM

## 2015-03-27 NOTE — Care Management (Signed)
CM spoke patient at bedside regarding discharge plan. Patient has appointment to fo/u at the [facility] on 7/18 at 4:15pm and explained that patient can take prescriptions to [facility] pharmacy in the morning and have them filled. 30 day free Eliquis care given with instructions  as well. Patient verbalized understanding and teach back done. Updated [place] RN on discharge plan. AVS updated. No further CM needs identified.

## 2015-03-27 NOTE — Progress Notes (Signed)
ANTICOAGULATION CONSULT NOTE - Follow Up Consult  Pharmacy Consult for apixaban Indication: atrial fibrillation  No Known Allergies  Patient Measurements: Height: 6' (182.9 cm) Weight: 180 lb 8.9 oz (81.9 kg) IBW/kg (Calculated) : 77.6  Vital Signs: Temp: 98.1 F (36.7 C) (07/10 0613) Temp Source: Oral (07/10 4103) BP: 146/76 mmHg (07/10 0617) Pulse Rate: 84 (07/10 0613)  Labs:  Recent Labs  03/24/15 1035 03/25/15 0353 03/26/15 0249  HGB  --  14.1 13.3  HCT  --  43.9 40.9  PLT  --  303 330  LABPROT  --  14.6  14.8 13.9  INR  --  1.12  1.14 1.05  HEPARINUNFRC  --  0.43  --   CREATININE  --  0.96 0.90  TROPONINI 0.23*  --   --     Estimated Creatinine Clearance: 92.2 mL/min (by C-G formula based on Cr of 0.9).  Assessment: 64 y/o M w/ hx of CAD presents with CP and is s/p cath on apixaban.  Goal of Therapy:  Monitor platelets by anticoagulation protocol: Yes   Plan:  Apixaban 5 mg po daily Pt education provided  Angela Burke, PharmD Pharmacy Resident 03/27/2015,8:23 AM

## 2015-03-28 ENCOUNTER — Encounter (HOSPITAL_COMMUNITY): Payer: Self-pay | Admitting: Cardiovascular Disease

## 2015-03-28 MED FILL — Clopidogrel Bisulfate Tab 300 MG (Base Equiv): ORAL | Qty: 2 | Status: AC

## 2015-03-30 NOTE — Discharge Summary (Signed)
Physician Discharge Summary  Omar Singh GGE:[id number] DOB: 1951/06/14 DOA: 03/23/2015  PCP: No PCP Per Patient  Admit date: 03/23/2015 Discharge date: 03/30/2015  Time spent: 35 minutes  Recommendations for Outpatient Follow-up:  1. Patient will need follow up with Cardiology ~ 2-3 weeks New meds Discharge  - apixaban (ELIQUIS) 5 MG TABS tablet  - atorvastatin (LIPITOR) 40 MG tablet - clopidogrel (PLAVIX) 75 MG tablet  - diltiazem (CARDIZEM CD) 240 MG 24 hr capsule - hydrALAZINE (APRESOLINE) 50 MG tablet  - metoprolol tartrate (LOPRESSOR) 25 MG tablet [1/2 tab nid] - nicotine (NICODERM CQ - DOSED IN MG/24 HOURS) 21 mg/24hr patch  - predniSONE (DELTASONE) 50 MG tablet [taper 5 days]  -also filled Albuterol/Dulera and Tiotropium inhalers and was educated regarding their use prior to discharge 2. Continued input regarding smoking cessation as needed as an outpatient 3. Would recommend checking chem 12, CBC 1 week 4. No need for taper off of steroids 5. Consider consolidation of Toprol to XL formulation moving forward if tolerated fairly well 6. Will need reinforcement regarding use of inhalers 7. He has a left adrenal adenoma that will need some characterization as an outpatient potentially with MRI once his acute issues settle down and this may need to be biopsied eventually  Discharge Diagnoses:  Principal Problem:   COPD exacerbation Active Problems:   CAD S/P RCA BMS '08, new RCA BMS 03/25/15   Pulmonary nodule   Alcohol abuse   Tobacco abuse   Acute respiratory failure with hypoxemia   NSTEMI (non-ST elevated myocardial infarction)   PAF (paroxysmal atrial fibrillation)   Discharge Condition: Alert oriented pleasant  Diet recommendation: Heart healthy low-salt  Filed Weights   03/24/15 0600 03/26/15 0000 03/27/15 0617  Weight: 81 kg (178 lb 9.2 oz) 86.5 kg (190 lb 11.2 oz) 81.9 kg (180 lb 8.9 oz)    History of present illness:    64 y/o ? prior CAD ? Mid RCA DES  stenting 2008, Continued EToH [[i drink 6-8 beers a day], 30 yr-pack h/o smoking, htn, hld, admitted  He has not seen an MD in ~ 6 yrs He is retired, used to be a Dealer He lives with his wife who quit smoking 3 yrs ago after diagnosis of lung Ca He presented to [facility]/23/15 with episodic 6 mo CP and cough which suddenyl worsened and became unrelenting 1-2 days PTA Found on CTA to have ? Nodule and L Suprarenal adrenal mass pending further characterization Cardiology consulted as ? troponin Hospital stay complicated by Afib + RVR-Chad score ~ 2 and he was started transiently on heparin and then based on Elliquis Had acath 7/8 showing severe CAD and stent was placed as well necessitating the use of Plavix   Hospital Course:   1. AECOPD -Solumedrol 60 q6 for 6 doses changed to Po Prednisone 50  -Non-purulent sputum?  -Completed 4 days of po doxycycline for COPD exacerbation per ISDA guidelines -Duonebs per RT -Transitioned to albuterol, tiotropium, Duklera on d/c and will need re-inforcement and teaching as OP  Afib + RVR CHad2Vasc2> 3 -Appreciate Cardiology input -converted to NSR 1830 03/24/15 -Amio d/c 7/8  -continue Cardizem 240 CD -Start Metoprolol 12.5 bid as 9 beats Vtach 7/9 -changed from Heparin gtt --->Elliquis 7/9  3. CAD s/p Stent DES to RCA this admission 03/25/15 per Dr. Angelena Form -rec'd ASA and started on Hep per Cardiology -Plavix for DES--duration to be delineated as per cardiology as an outpatient  3. Hld - ASCVD risk  already elevated - High intensitying Lipitor 40 mg daily for 2/2 prevention  4.EToH -stopped drinking 6 days ago to cardiology-stopped drinking 9 days ago when i asked him  -CIWA is 0 -d/c CIWA 7/8  5. Htn urgency -add hydralazine scheduled po 50 q6-->q8 per cards 7/9 -add isordil 5 bid in addition--d/c by cards 7/9 -Did not seem to need diuresis  cardiology  6. Smoker -needs Nicotine  patch on discharge -contemplative-nursing to provide re: cessation  7.AKI -resolved -consider diuresis  8. Impaired gluc tolerance -SSI coverage for now -will taper steroids and expect hyperglycemia will improve -no Long acting at present  L Adrenal adenoma + Asc Aorta dilatation--both are important but non-salient to current hospitalization I will briefly discuss next steps with him once acute issues resovle  Consultants: 2. Cardiology  Procedures:  Cath 7/8   Discharge Exam: Filed Vitals:   03/27/15 0617  BP: 146/76  Pulse:   Temp:   Resp:     General: Alert pleasant oriented Cardiovascular: S1-S2 no murmur rub or gallop Respiratory: Mild wheeze no rales or rhonchi  Discharge Instructions   Discharge Instructions    Amb Referral to Cardiac Rehabilitation    Complete by:  As directed   Congestive Heart Failure: If diagnosis is Heart Failure, patient MUST meet each of the CMS criteria: 1. Left Ventricular Ejection Fraction </= 35% 2. NYHA class II-IV symptoms despite being on optimal heart failure therapy for at least 6 weeks. 3. Stable = have not had a recent (<6 weeks) or planned (<6 months) major cardiovascular hospitalization or procedure  Program Details: - Physician supervised classes - 1-3 classes per week over a 12-18 week period, generally for a total of 36 sessions  Physician Certification: I certify that the above Cardiac Rehabilitation treatment is medically necessary and is medically approved by me for treatment of this patient. The patient is willing and cooperative, able to ambulate and medically stable to participate in exercise rehabilitation. The participant's progress and Individualized Treatment Plan will be reviewed by the Medical Director, Cardiac Rehab staff and as indicated by the Referring/Ordering Physician.  Diagnosis:  Myocardial Infarction     Diet - low sodium heart healthy    Complete by:  As directed      Discharge  instructions    Complete by:  As directed   You will be started on multiple new medications for your heart conditions Please take them carefully as directed You will be meeting a new Primary Phsycian Dr. Adrian Blackwater who will take excellent car eof you and coordinate your care-please see her on 7/18 Please quit smoking and also consider a moderate intesnity exercise regimen Cardiac Rehab will work with you on this Take care and good luck     Increase activity slowly    Complete by:  As directed           Discharge Medication List as of 03/27/2015 10:17 AM    START taking these medications   Details  albuterol (PROVENTIL HFA;VENTOLIN HFA) 108 (90 BASE) MCG/ACT inhaler Inhale 2 puffs into the lungs every 6 (six) hours as needed for wheezing or shortness of breath., Starting 03/27/2015, Until Discontinued, Print    apixaban (ELIQUIS) 5 MG TABS tablet Take 1 tablet (5 mg total) by mouth 2 (two) times daily., Starting 03/27/2015, Until Discontinued, Print    atorvastatin (LIPITOR) 40 MG tablet Take 1 tablet (40 mg total) by mouth daily at 6 PM., Starting 03/27/2015, Until Discontinued, Print    clopidogrel (PLAVIX)  75 MG tablet Take 1 tablet (75 mg total) by mouth daily with breakfast., Starting 03/27/2015, Until Discontinued, Normal    diltiazem (CARDIZEM CD) 240 MG 24 hr capsule Take 1 capsule (240 mg total) by mouth daily., Starting 03/27/2015, Until Discontinued, Print    hydrALAZINE (APRESOLINE) 50 MG tablet Take 1 tablet (50 mg total) by mouth every 8 (eight) hours., Starting 03/27/2015, Until Discontinued, Print    metoprolol tartrate (LOPRESSOR) 25 MG tablet Take 0.5 tablets (12.5 mg total) by mouth 2 (two) times daily., Starting 03/27/2015, Until Discontinued, Print    mometasone-formoterol (DULERA) 200-5 MCG/ACT AERO Inhale 2 puffs into the lungs daily., Starting 03/27/2015, Until Discontinued, Print    nicotine (NICODERM CQ - DOSED IN MG/24 HOURS) 21 mg/24hr patch Place 1 patch (21 mg total)  onto the skin daily., Starting 03/27/2015, Until Discontinued, Normal    predniSONE (DELTASONE) 50 MG tablet Take 1 tablet (50 mg total) by mouth daily before breakfast., Starting 03/27/2015, Until Discontinued, Print    tiotropium (SPIRIVA HANDIHALER) 18 MCG inhalation capsule Place 1 capsule (18 mcg total) into inhaler and inhale 2 (two) times daily., Starting 03/27/2015, Until Discontinued, Print      CONTINUE these medications which have NOT CHANGED   Details  diphenhydramine-acetaminophen (TYLENOL PM) 25-500 MG TABS Take 3 tablets by mouth at bedtime as needed (sleep)., Until Discontinued, Historical Med       No Known Allergies Follow-up Information    Follow up with [facility]     On 04/04/2015.   Why:  Hospital follow-up appointment on 04/04/15 at 4:15 pm with Dr. Adrian Blackwater.   Contact information:   [address] [place] [address] [phone]      Follow up with Warren Danes, MD.   Specialty:  Cardiology   Why:  office will contact you   Contact information:   [address] [place] [address] [phone]        The results of significant diagnostics from this hospitalization (including imaging, microbiology, ancillary and laboratory) are listed below for reference.    Significant Diagnostic Studies: Dg Chest 2 View  03/25/2015   CLINICAL DATA:  Pneumonia and shortness of breath.  EXAM: CHEST  2 VIEW  COMPARISON:  03/23/2015  FINDINGS: Heart and mediastinum are within normal limits. Lungs are clear without airspace disease or edema. Trachea is midline. No acute bone abnormality.  IMPRESSION: Stable chest radiograph findings without focal disease. The subtle parenchymal densities seen on the recent CT are too small to be characterized on plain radiographs.   Electronically Signed   By: Markus Daft M.D.   On: 03/25/2015 08:02   Dg Chest 2 View  03/23/2015   CLINICAL DATA:  Left-sided chest pain for 1 week,  more severe today  EXAM: CHEST  2 VIEW  COMPARISON:  03/13/2006  FINDINGS: The heart size and vascular pattern are normal. There is no infiltrate consolidation or edema. There is a 1.3 cm nodular opacity over the left lateral costophrenic angle which is not seen on the prior study.  IMPRESSION: Recommend CT thorax for possible pulmonary nodule.  These results will be called to the ordering clinician or representative by the Radiologist Assistant, and communication documented in the PACS or zVision Dashboard.   Electronically Signed   By: Skipper Cliche M.D.   On: 03/23/2015 13:34   Ct Angio Chest Pe W/cm &/or Wo Cm  03/23/2015   CLINICAL DATA:  Chest pain for 4  weeks worse today, cough, shortness of breath, history smoking, hypertension, hyperlipidemia, coronary artery disease post angioplasty  EXAM: CT ANGIOGRAPHY CHEST WITH CONTRAST  TECHNIQUE: Multidetector CT imaging of the chest was performed using the standard protocol during bolus administration of intravenous contrast. Multiplanar CT image reconstructions and MIPs were obtained to evaluate the vascular anatomy.  CONTRAST:  150mL OMNIPAQUE IOHEXOL 350 MG/ML SOLN IV  COMPARISON:  None  FINDINGS: Scattered atherosclerotic calcifications aorta, coronary arteries, and proximal great vessels.  Minimal aneurysmal dilatation of ascending thoracic aorta 4.0 x 4.1 cm image 47.  Scattered respiratory motion artifacts which limit exam.  Dilated central pulmonary arteries question pulmonary arterial hypertension.  Pulmonary arteries well opacified and grossly patent.  No definite evidence of pulmonary embolism.  Few scattered normal sized mediastinal lymph nodes.  Low-attenuation LEFT adrenal nodule 2.3 x 1.6 cm compatible with adenoma.  Remaining visualized upper abdomen unremarkable.  Peribronchial vascular infiltrate identified in both lower lobes RIGHT greater than LEFT, minimally in RIGHT upper lobe, question bronchiolitis consistent with COPD.  Underlying  emphysematous changes and central peribronchial thickening.  No segmental consolidation, pleural effusion or pneumothorax.  No acute osseous findings.  Review of the MIP images confirms the above findings.  IMPRESSION: No evidence pulmonary embolism.  COPD changes with peribronchovascular infiltrates in both lower lobes RIGHT greater than LEFT and less in RIGHT upper lobe question bronchiolitis.  Scattered atherosclerotic disease with aneurysmal dilatation of the ascending thoracic aorta, recommendation below.  Recommend annual imaging followup by CTA or MRA. This recommendation follows 2010 ACCF/AHA/AATS/ACR/ASA/SCA/SCAI/SIR/STS/SVM Guidelines for the Diagnosis and Management of Patients with Thoracic Aortic Disease. Circulation. 2010; 121: J673-A193   Electronically Signed   By: Lavonia Dana M.D.   On: 03/23/2015 17:21    Microbiology: No results found for this or any previous visit (from the past 240 hour(s)).   Labs: Basic Metabolic Panel:  Recent Labs Lab 03/23/15 1350 03/24/15 0505 03/25/15 0353 03/26/15 0249  NA 137 137 137 137  K 4.2 5.1 4.6 4.7  CL 92* 97* 96* 96*  CO2 32 32 33* 32  GLUCOSE 163* 126* 147* 139*  BUN 19 18 22* 25*  CREATININE 1.56* 0.97 0.96 0.90  CALCIUM 9.6 9.2 9.3 8.8*  MG  --   --   --  2.2  PHOS  --   --  4.1  --    Liver Function Tests:  Recent Labs Lab 03/23/15 1620 03/25/15 0353 03/26/15 0249  AST 23  --  33  ALT 20  --  49  ALKPHOS 75  --  59  BILITOT 0.6  --  0.3  PROT 7.7  --  5.5*  ALBUMIN 3.1* 2.6* 2.3*   No results for input(s): LIPASE, AMYLASE in the last 168 hours. No results for input(s): AMMONIA in the last 168 hours. CBC:  Recent Labs Lab 03/23/15 1350 03/24/15 0505 03/25/15 0353 03/26/15 0249  WBC 13.8* 11.0* 16.0* 14.4*  HGB 15.7 14.1 14.1 13.3  HCT 48.0 43.9 43.9 40.9  MCV 103.0* 102.6* 102.6* 102.3*  PLT 265 273 303 330   Cardiac Enzymes:  Recent Labs Lab 03/23/15 1620 03/23/15 2208 03/24/15 0505  03/24/15 1035  TROPONINI 0.52* 0.43* 0.32* 0.23*   BNP: BNP (last 3 results)  Recent Labs  03/23/15 1350  BNP 495.2*    ProBNP (last 3 results) No results for input(s): PROBNP in the last 8760 hours.  CBG:  Recent Labs Lab 03/26/15 0616 03/26/15 1130 03/26/15 1632 03/26/15 2110 03/27/15 0717  GLUCAP  170* 171* 137* 131* 122*       Signed:  Nita Sells  [facility] 03/30/2015, 8:53 AM

## 2015-04-04 ENCOUNTER — Encounter: Payer: Self-pay | Admitting: Family Medicine

## 2015-04-04 ENCOUNTER — Ambulatory Visit: Payer: Self-pay | Attending: Family Medicine | Admitting: Family Medicine

## 2015-04-04 VITALS — BP 136/75 | HR 78 | Temp 98.1°F | Resp 16 | Ht 72.0 in | Wt 180.0 lb

## 2015-04-04 DIAGNOSIS — I2510 Atherosclerotic heart disease of native coronary artery without angina pectoris: Secondary | ICD-10-CM | POA: Insufficient documentation

## 2015-04-04 DIAGNOSIS — F17200 Nicotine dependence, unspecified, uncomplicated: Secondary | ICD-10-CM | POA: Insufficient documentation

## 2015-04-04 DIAGNOSIS — J441 Chronic obstructive pulmonary disease with (acute) exacerbation: Secondary | ICD-10-CM

## 2015-04-04 DIAGNOSIS — J449 Chronic obstructive pulmonary disease, unspecified: Secondary | ICD-10-CM | POA: Insufficient documentation

## 2015-04-04 DIAGNOSIS — Z9861 Coronary angioplasty status: Secondary | ICD-10-CM

## 2015-04-04 DIAGNOSIS — Z720 Tobacco use: Secondary | ICD-10-CM

## 2015-04-04 MED ORDER — NICOTINE 14 MG/24HR TD PT24: 14.0000 mg | MEDICATED_PATCH | Freq: Every day | TRANSDERMAL | Status: DC

## 2015-04-04 MED ORDER — NICOTINE 21 MG/24HR TD PT24: 21.0000 mg | MEDICATED_PATCH | Freq: Every day | TRANSDERMAL | Status: DC

## 2015-04-04 MED ORDER — NICOTINE 7 MG/24HR TD PT24: 7.0000 mg | MEDICATED_PATCH | Freq: Every day | TRANSDERMAL | Status: DC

## 2015-04-04 NOTE — Progress Notes (Signed)
   Subjective:    Patient ID: Omar Singh, male    DOB: September 17, 1951, 64 y.o.   MRN: [id number] CC: establish care, HFU CAD and COPD exacerbation  HPI 64 yo M presents for f/u visit   1. CAD: doing well. Compliant with medication regimen. Last cigarette was 03/24/2015. Patient has been smoking for most of his life. He is requesting nicotine patch Rx.   2. COPD: having cough that is productive. No CP, SOB, fever or chills. Taking prednisone x 5 day course post hospitalization.   3. Smoker: smoked for most of his life. Having serious nicotine cravings. Desires to quit. Confident in his ability to quit with patches.   Soc Hx: heavy smoker, recently quit  Med Hx: COPD Surg Hx: recent cath  Review of Systems  Constitutional: Negative for fever, chills, fatigue and unexpected weight change.  Eyes: Negative for visual disturbance.  Respiratory: Positive for cough. Negative for shortness of breath.   Cardiovascular: Negative for chest pain, palpitations and leg swelling.  Gastrointestinal: Negative for nausea, vomiting, abdominal pain, diarrhea, constipation and blood in stool.  Musculoskeletal: Negative for myalgias, back pain, arthralgias, gait problem and neck pain.  Skin: Negative for rash.      Objective:   Physical Exam BP 136/75 mmHg  Pulse 78  Temp(Src) 98.1 F (36.7 C) (Oral)  Resp 16  Ht 6' (1.829 m)  Wt 180 lb (81.647 kg)  BMI 24.41 kg/m2  SpO2 97% General appearance: alert, cooperative and no distress Lungs: clear to auscultation bilaterally Heart: regular rate and rhythm, S1, S2 normal, no murmur, click, rub or gallop Extremities: extremities normal, atraumatic, no cyanosis or edema Skin: warm and dry, bruising in R volar wrist and forearm      Assessment & Plan:

## 2015-04-04 NOTE — Assessment & Plan Note (Signed)
COPD: Finish prednisone Smoking cessation

## 2015-04-04 NOTE — Assessment & Plan Note (Signed)
Heart disease: no chest pain  Continue current regime Smoking cessation

## 2015-04-04 NOTE — Patient Instructions (Signed)
Omar Singh,  Thank you for coming in today. It was a pleasure meeting you. I look forward to being your primary doctor.  1. Smoking:  ready to quit. Great decision Smoking cessation support: smoking cessation hotline: 1-800-QUIT-NOW.  Smoking cessation classes are available through [facility]. Call [phone] or visit our website at [email or web address]  Nicotine patch 21 mg for 6 weeks 14 mg for 2 weeks 7 mg for 2 weeks   2. Heart disease: Continue current regime Smoking cessation  3. COPD: Finish prednisone Smoking cessation  Please apply for [place] discount and orange card, you can also inquire if any of your medications are on the PASS (medications assistance) list. F/u in 4-6 weeks for flu shot and healthcare maintenance physical   Dr. Adrian Blackwater

## 2015-04-04 NOTE — Progress Notes (Signed)
Establish Care  HFU- Heart surgery  Last tobacco use 03/24/2015

## 2015-04-04 NOTE — Assessment & Plan Note (Signed)
1. Smoking:  ready to quit. Great decision Smoking cessation support: smoking cessation hotline: 1-800-QUIT-NOW.  Smoking cessation classes are available through [facility]. Call [phone] or visit our website at [email or web address]  Nicotine patch 21 mg for 6 weeks 14 mg for 2 weeks 7 mg for 2 weeks

## 2015-04-07 ENCOUNTER — Telehealth (HOSPITAL_COMMUNITY): Payer: Self-pay | Admitting: *Deleted

## 2015-04-07 NOTE — Telephone Encounter (Signed)
Pt referred to cardiac rehab s/p nstemi and stent placement 03/2015. Noted in epic pt doesn't have follow up appt with cardiologist.  Call cardiologist office.  Office is trying to get in touch with pt with no luck.  Pt sent letter to address listed in epic to please contact [facility] to schedule appt.  Contact phone number provided.  Also included financial assistance form to be completed by pt to determine eligibility to receive assistance to participate in cardiac rehab. Cherre Huger, BSN

## 2015-05-02 ENCOUNTER — Encounter: Payer: Self-pay | Admitting: Family Medicine

## 2018-12-05 ENCOUNTER — Observation Stay (HOSPITAL_COMMUNITY)
Admission: EM | Admit: 2018-12-05 | Discharge: 2018-12-07 | Disposition: A | Discharge status: Home / Self Care | Payer: Medicare PPO | Attending: Family Medicine | Admitting: Family Medicine

## 2018-12-05 ENCOUNTER — Emergency Department (HOSPITAL_COMMUNITY): Payer: Medicare PPO

## 2018-12-05 ENCOUNTER — Other Ambulatory Visit: Payer: Self-pay

## 2018-12-05 DIAGNOSIS — Z9114 Patient's other noncompliance with medication regimen: Secondary | ICD-10-CM

## 2018-12-05 DIAGNOSIS — I161 Hypertensive emergency: Secondary | ICD-10-CM | POA: Insufficient documentation

## 2018-12-05 DIAGNOSIS — R0789 Other chest pain: Principal | ICD-10-CM | POA: Insufficient documentation

## 2018-12-05 DIAGNOSIS — R2241 Localized swelling, mass and lump, right lower limb: Secondary | ICD-10-CM | POA: Insufficient documentation

## 2018-12-05 DIAGNOSIS — I2510 Atherosclerotic heart disease of native coronary artery without angina pectoris: Secondary | ICD-10-CM | POA: Diagnosis not present

## 2018-12-05 DIAGNOSIS — Z79899 Other long term (current) drug therapy: Secondary | ICD-10-CM | POA: Insufficient documentation

## 2018-12-05 DIAGNOSIS — I739 Peripheral vascular disease, unspecified: Secondary | ICD-10-CM

## 2018-12-05 DIAGNOSIS — Z91120 Patient's intentional underdosing of medication regimen due to financial hardship: Secondary | ICD-10-CM

## 2018-12-05 DIAGNOSIS — I25709 Atherosclerosis of coronary artery bypass graft(s), unspecified, with unspecified angina pectoris: Secondary | ICD-10-CM

## 2018-12-05 DIAGNOSIS — Z7901 Long term (current) use of anticoagulants: Secondary | ICD-10-CM | POA: Insufficient documentation

## 2018-12-05 DIAGNOSIS — R600 Localized edema: Secondary | ICD-10-CM | POA: Diagnosis not present

## 2018-12-05 DIAGNOSIS — I10 Essential (primary) hypertension: Secondary | ICD-10-CM | POA: Diagnosis not present

## 2018-12-05 DIAGNOSIS — R079 Chest pain, unspecified: Secondary | ICD-10-CM | POA: Diagnosis present

## 2018-12-05 DIAGNOSIS — I259 Chronic ischemic heart disease, unspecified: Secondary | ICD-10-CM

## 2018-12-05 DIAGNOSIS — I160 Hypertensive urgency: Secondary | ICD-10-CM | POA: Diagnosis not present

## 2018-12-05 DIAGNOSIS — G4733 Obstructive sleep apnea (adult) (pediatric): Secondary | ICD-10-CM | POA: Insufficient documentation

## 2018-12-05 DIAGNOSIS — Z87891 Personal history of nicotine dependence: Secondary | ICD-10-CM | POA: Insufficient documentation

## 2018-12-05 DIAGNOSIS — T46906A Underdosing of unspecified agents primarily affecting the cardiovascular system, initial encounter: Secondary | ICD-10-CM

## 2018-12-05 DIAGNOSIS — Z91148 Patient's other noncompliance with medication regimen for other reason: Secondary | ICD-10-CM

## 2018-12-05 DIAGNOSIS — J449 Chronic obstructive pulmonary disease, unspecified: Secondary | ICD-10-CM | POA: Insufficient documentation

## 2018-12-05 LAB — COMPREHENSIVE METABOLIC PANEL
ALT: 14 U/L (ref 0–44)
ANION GAP: 8 (ref 5–15)
AST: 17 U/L (ref 15–41)
Albumin: 3.9 g/dL (ref 3.5–5.0)
Alkaline Phosphatase: 58 U/L (ref 38–126)
BILIRUBIN TOTAL: 0.7 mg/dL (ref 0.3–1.2)
BUN: 9 mg/dL (ref 8–23)
CO2: 31 mmol/L (ref 22–32)
Calcium: 9.4 mg/dL (ref 8.9–10.3)
Chloride: 100 mmol/L (ref 98–111)
Creatinine, Ser: 0.93 mg/dL (ref 0.61–1.24)
GFR calc Af Amer: >60 mL/min (ref >60)
Glucose, Bld: 103 mg/dL — ABNORMAL HIGH (ref 70–99)
Potassium: 4.7 mmol/L (ref 3.5–5.1)
Sodium: 139 mmol/L (ref 135–145)
Total Protein: 6.8 g/dL (ref 6.5–8.1)

## 2018-12-05 LAB — CBC WITH DIFFERENTIAL/PLATELET
Abs Immature Granulocytes: 0.03 10*3/uL (ref 0.00–0.07)
BASOS ABS: 0.1 10*3/uL (ref 0.0–0.1)
Basophils Relative: 1 %
EOS ABS: 0.1 10*3/uL (ref 0.0–0.5)
Eosinophils Relative: 1 %
HEMATOCRIT: 47.9 % (ref 39.0–52.0)
HEMOGLOBIN: 15.3 g/dL (ref 13.0–17.0)
Immature Granulocytes: 0 %
LYMPHS ABS: 1.6 10*3/uL (ref 0.7–4.0)
LYMPHS PCT: 20 %
MCH: 31.2 pg (ref 26.0–34.0)
MCHC: 31.9 g/dL (ref 30.0–36.0)
MCV: 97.8 fL (ref 80.0–100.0)
Monocytes Absolute: 0.7 10*3/uL (ref 0.1–1.0)
Monocytes Relative: 9 %
NEUTROS PCT: 69 %
NRBC: 0 % (ref 0.0–0.2)
Neutro Abs: 5.5 10*3/uL (ref 1.7–7.7)
Platelets: 244 10*3/uL (ref 150–400)
RBC: 4.9 MIL/uL (ref 4.22–5.81)
RDW: 12.7 % (ref 11.5–15.5)
WBC: 8 10*3/uL (ref 4.0–10.5)

## 2018-12-05 LAB — TROPONIN I: Troponin I: <0.03 ng/mL (ref <0.03)

## 2018-12-05 LAB — BRAIN NATRIURETIC PEPTIDE: B Natriuretic Peptide: 221.9 pg/mL — ABNORMAL HIGH (ref 0.0–100.0)

## 2018-12-05 LAB — I-STAT TROPONIN, ED: TROPONIN I, POC: 0 ng/mL (ref 0.00–0.08)

## 2018-12-05 MED ORDER — ASPIRIN EC 81 MG PO TBEC
81.0000 mg | DELAYED_RELEASE_TABLET | Freq: Every day | ORAL | Status: DC
  Administered 2018-12-06 – 2018-12-07 (×2): 81 mg via ORAL
  Filled 2018-12-05 (×2): qty 1

## 2018-12-05 MED ORDER — ACETAMINOPHEN 325 MG PO TABS: 650.0000 mg | ORAL_TABLET | ORAL | Status: DC | PRN

## 2018-12-05 MED ORDER — METOPROLOL TARTRATE 25 MG PO TABS
25.0000 mg | ORAL_TABLET | Freq: Two times a day (BID) | ORAL | Status: DC
  Administered 2018-12-05 – 2018-12-07 (×4): 25 mg via ORAL
  Filled 2018-12-05 (×4): qty 1

## 2018-12-05 MED ORDER — METOPROLOL TARTRATE 25 MG PO TABS
12.5000 mg | ORAL_TABLET | Freq: Once | ORAL | Status: AC
  Administered 2018-12-05: 12.5 mg via ORAL
  Filled 2018-12-05: qty 1

## 2018-12-05 MED ORDER — HYDRALAZINE HCL 25 MG PO TABS
50.0000 mg | ORAL_TABLET | Freq: Once | ORAL | Status: AC
  Administered 2018-12-05: 50 mg via ORAL
  Filled 2018-12-05: qty 2

## 2018-12-05 MED ORDER — ASPIRIN 300 MG RE SUPP: 300.0000 mg | RECTAL | Status: AC

## 2018-12-05 MED ORDER — NITROGLYCERIN 0.4 MG SL SUBL: 0.4000 mg | SUBLINGUAL_TABLET | SUBLINGUAL | Status: DC | PRN

## 2018-12-05 MED ORDER — ASPIRIN 81 MG PO CHEW
324.0000 mg | CHEWABLE_TABLET | ORAL | Status: AC
  Administered 2018-12-05: 324 mg via ORAL
  Filled 2018-12-05: qty 4

## 2018-12-05 MED ORDER — ONDANSETRON HCL 4 MG/2ML IJ SOLN: 4.0000 mg | Freq: Four times a day (QID) | INTRAMUSCULAR | Status: DC | PRN

## 2018-12-05 MED ORDER — ATORVASTATIN CALCIUM 10 MG PO TABS
20.0000 mg | ORAL_TABLET | Freq: Every day | ORAL | Status: DC
  Administered 2018-12-05: 20 mg via ORAL
  Filled 2018-12-05: qty 2

## 2018-12-05 MED ORDER — ENOXAPARIN SODIUM 40 MG/0.4ML ~~LOC~~ SOLN
40.0000 mg | SUBCUTANEOUS | Status: DC
  Administered 2018-12-05 – 2018-12-06 (×2): 40 mg via SUBCUTANEOUS
  Filled 2018-12-05 (×2): qty 0.4

## 2018-12-05 NOTE — H&P (Addendum)
[facility] Admission History and Physical Service Pager: [phone]  Patient name: Omar Singh Medical record number: [id number] Date of birth: 06-23-1951 Age: 68 y.o. Gender: male  Primary Care Provider: Patient, No Pcp Per Consultants: Cardiology Code Status: Full code  Chief Complaint: Leg pain when walking  Assessment and Plan: Omar Singh is a 68 y.o. male presenting with leg pain and admitted for ACS rule out. PMH is significant for His previous medical history is significant for CAD (s/p PCI in 2008, 2017), hypertension, A. fib, COPD, smoking history.  ACS rule out Acute on chronic. Most likely etiology is unstable angina vs GERD, vs MSK vs some type of esophageal dysmotility. Current work up is not in favor of ACS being the most likely cause but admission reasonable given heart score and past history of MI with no medications. He denied any chest pain on the day of presentation.  He reported 2 episodes of chest pain in the past week.  Most recently 3/18.  The chest pain was electric in character and radiated from his chest upward to his neck/throat and Adam's apple.  No radiation to arm/jaw.  His physical exam was unremarkable.  On admission, his vitals were notable for hypertension with systolic pressure up to 222 and diastolic pressure up to 979, tachypneic to 23.  Admission labs were notable for BNP 221.9, POC troponin 0 0.00.  EKG showed no evidence of ST changes, T wave inversions or Q waves.  Chest x-ray showed no acute cardiopulmonary findings.  The differential for his chest pain is wide at this time and its description does not fit closely closely with typical chest pain.  It is reassuring that his current chest pain does not feel similar to past episodes of MI.  Will admit to inpatient for ACS rule out. -Admit inpatient, attending Dr. Andria Frames -Consult cardiology -Cardiac telemetry -Trend troponin -Echocardiogram -Monitor vitals -Follow-up  risk stratification labs: A1c, TSH, lipid panel  Hypertensive urgency He has a known history of hypertension and does not take any medication at home.  On admission, his vitals were remarkable for systolics up to 892 and diastolic pressures up to 119.  He denied headaches or vision changes.  There is no evidence of endorgan dysfunction on work-up thus far.  He was given hydralazine 50 mg in the emergency room. -Start metoprolol titrate 25 mg twice daily -Hydralazine 50 mg for systolic pressures over 417 or diastolics over 408 -Monitor vitals  History of A. fib, paroxysmal Per chart review, he has a history of paroxysmal A. fib.  On admission today he had a regular rate and rhythm on exam.  EKG showed a sinus rhythm.  He takes no home medications. -Cardiac monitors  CAD He has a history significant for CAD with PCI x2 in the past.  Last echocardiogram in 03/2015 shows EF of 55% with no wall motion abnormalities and a thickened atrial septum.  Last catheterization done 03/2015 showed severe stenosis of RCA treated with restenting.  At that time, 1 year of Plavix and Eliquis along with a statin and beta-blocker was advised.  He takes no medication at home currently. -Atorvastatin 40 mg started -Calculate ASCVD lipid panel is available and consider high intensity statin  Social circumstances Mr. Heisler reports that he is not able to afford his medication.  Is the primary reason that he takes no medication at home currently.  Based on his above medical issues, he should be on at least aspirin, statin, beta-blocker.  He appears willing to take these medications if they can be obtained affordably. -Consult clinical social work in the morning  COPD He has an extensive smoking history although currently smokes about 1 pack/week.  He previously smoked 2 packs/day for 40 to 50 years.  On physical exam on admission he had only mild expiratory wheezing noted some fields, he was breathing comfortably on room  air.  He takes no controller medication at home. -Continue to monitor  FEN/GI: Cardiac diet Prophylaxis: Lovenox  Disposition: Likely discharge home 3/21  History of Present Illness:  Omar Singh is a 68 y.o. male presenting with leg pain and admitted for ACS rule out.  His previous medical history is significant for CAD (s/p PCI in 2008, 2017), hypertension, A. fib, COPD, smoking history.  The main reason for Mrs. Mr. Pryde presentation to the ED was for leg pain.  He noticed that for the past 2 weeks, he was experiencing significant leg pain while walking short distances.  He reports the walk to his mailbox is about 100 feet.  He generally is able to walk to his mailbox and back to the house that problem.  In the past 2 weeks, he is been having increasing difficulty walking back from the mailbox without cramping.  He describes the pain in his legs is a muscular cramping/burning sensation where he is forced to straighten his legs and rests for a time in order for the pain to go away.  He has not taken any medication for the pain.  He has never been evaluated for vascular disease of the lower extremities.  While in the ED, he noted that he had experienced chest pain twice in the past several weeks.  He reports one episode about a week ago and a second episode on 3/18.  During both of these episodes, he woke in the night from significant pain in his chest.  He described the pain as an electric sensation starting in his throat and his chest and radiating upward toward his neck and Adam's apple.  The episodes lasted several minutes before going away.  He was able to return to sleep after each episode.  These episodes did not feel similar to past episodes of MI.  He denied radiation to arms, jaw or back.  He denies shortness of breath or palpitations recently.  The episodes were too brief to noted any significant changes from medication.  He did not notice any recurrence of these episodes with  exertion.  In the ED, EKG appeared unchanged from previous, POC troponin was 0.00.  This score is found to be 5 and he was admitted for ACS rule out.  Review Of Systems: Per HPI with the following additions:   Review of Systems  Constitutional: Negative for chills, fever and malaise/fatigue.  HENT: Negative for congestion, hearing loss and sore throat.   Eyes: Negative for blurred vision.  Respiratory: Negative for cough, sputum production and shortness of breath.   Cardiovascular: Positive for claudication and leg swelling. Negative for chest pain and palpitations.  Gastrointestinal: Negative for abdominal pain, constipation, diarrhea, nausea and vomiting.  Genitourinary: Negative for dysuria.  Musculoskeletal: Negative for myalgias.  Skin: Negative for rash.  Neurological: Negative for tremors and headaches.  Endo/Heme/Allergies: Does not bruise/bleed easily.    Patient Active Problem List   Diagnosis Date Noted  . Chest pain 12/05/2018  . PAF (paroxysmal atrial fibrillation) ([place]) 03/26/2015  . NSTEMI (non-ST elevated myocardial infarction) ([place])   . Hypoxia 03/23/2015  .  Shortness of breath 03/23/2015  . CAD S/P RCA BMS '08, new RCA BMS 03/25/15 03/23/2015  . Elevated troponin 03/23/2015  . Pulmonary nodule 03/23/2015  . Alcohol abuse 03/23/2015  . Tobacco abuse 03/23/2015  . Acute respiratory failure with hypoxemia ([place]) 03/23/2015  . COPD exacerbation ([place]) 03/23/2015    Past Medical History: Past Medical History:  Diagnosis Date  . Acute exacerbation of chronic obstructive pulmonary disease (COPD)    [name] 03/23/2015  . Alcohol abuse   . CAD (coronary artery disease)    a. s/p multi-link vision stent to the mid RCA in 2007 at [facility].  . Hyperlipidemia   . Hypertension   . Myocardial infarction [facility]) 2008   [name] 03/23/2015  . Panic attack   . Tobacco abuse     Past Surgical History: Past Surgical History:  Procedure Laterality Date  . CARDIAC  CATHETERIZATION N/A 03/25/2015   Procedure: Left Heart Cath and Coronary Angiography;  Surgeon: Burnell Blanks, MD;  Location: [place] [facility];  Service: Cardiovascular;  Laterality: N/A;  . CARDIAC CATHETERIZATION N/A 03/25/2015   Procedure: Coronary Stent Intervention;  Surgeon: Burnell Blanks, MD;  Location: [place] [facility];  Service: Cardiovascular;  Laterality: N/A;  . CORONARY ANGIOPLASTY WITH STENT PLACEMENT  2008   "1"  . TONSILLECTOMY      Social History: Social History   Tobacco Use  . Smoking status: Former Smoker    Packs/day: 1.00    Years: 47.00    Pack years: 47.00    Types: Cigarettes    Last attempt to quit: 03/24/2015    Years since quitting: 3.7  . Smokeless tobacco: Never Used  Substance Use Topics  . Alcohol use: Yes    Alcohol/week: 56.0 standard drinks    Types: 56 Cans of beer per week    Comment: 6-8 beers nightly  . Drug use: No   Additional social history: States he has quit drinking; quit last year Please also refer to relevant sections of EMR.  Family History: Family History  Problem Relation Age of Onset  . CVA Unknown        Grandparents had strokes    Allergies and Medications: No Known Allergies No current facility-administered medications on file prior to encounter.    Current Outpatient Medications on File Prior to Encounter  Medication Sig Dispense Refill  . albuterol (PROVENTIL HFA;VENTOLIN HFA) 108 (90 BASE) MCG/ACT inhaler Inhale 2 puffs into the lungs every 6 (six) hours as needed for wheezing or shortness of breath. (Patient not taking: Reported on 12/05/2018) 1 Inhaler 2  . apixaban (ELIQUIS) 5 MG TABS tablet Take 1 tablet (5 mg total) by mouth 2 (two) times daily. (Patient not taking: Reported on 12/05/2018) 60 tablet 0  . atorvastatin (LIPITOR) 40 MG tablet Take 1 tablet (40 mg total) by mouth daily at 6 PM. (Patient not taking: Reported on 12/05/2018) 30 tablet 0  . clopidogrel (PLAVIX) 75 MG tablet Take 1 tablet  (75 mg total) by mouth daily with breakfast. (Patient not taking: Reported on 12/05/2018) 30 tablet 0  . diltiazem (CARDIZEM CD) 240 MG 24 hr capsule Take 1 capsule (240 mg total) by mouth daily. (Patient not taking: Reported on 12/05/2018) 30 capsule 0  . hydrALAZINE (APRESOLINE) 50 MG tablet Take 1 tablet (50 mg total) by mouth every 8 (eight) hours. (Patient not taking: Reported on 12/05/2018) 90 tablet 0  . metoprolol tartrate (LOPRESSOR) 25 MG tablet Take 0.5 tablets (12.5 mg total) by mouth  2 (two) times daily. (Patient not taking: Reported on 12/05/2018) 60 tablet 0  . mometasone-formoterol (DULERA) 200-5 MCG/ACT AERO Inhale 2 puffs into the lungs daily. (Patient not taking: Reported on 12/05/2018) 1 Inhaler 0  . nicotine (NICODERM CQ - DOSED IN MG/24 HOURS) 21 mg/24hr patch Place 1 patch (21 mg total) onto the skin daily. (Patient not taking: Reported on 12/05/2018) 42 patch 0  . nicotine (NICODERM CQ) 14 mg/24hr patch Place 1 patch (14 mg total) onto the skin daily. (Patient not taking: Reported on 12/05/2018) 14 patch 0  . nicotine (NICODERM CQ) 7 mg/24hr patch Place 1 patch (7 mg total) onto the skin daily. (Patient not taking: Reported on 12/05/2018) 14 patch 0  . predniSONE (DELTASONE) 50 MG tablet Take 1 tablet (50 mg total) by mouth daily before breakfast. (Patient not taking: Reported on 12/05/2018) 5 tablet 0  . tiotropium (SPIRIVA HANDIHALER) 18 MCG inhalation capsule Place 1 capsule (18 mcg total) into inhaler and inhale 2 (two) times daily. (Patient not taking: Reported on 12/05/2018) 30 capsule 0    Objective: BP (!) 181/99 (BP Location: Left Arm)   Pulse 72   Temp 97.7 F (36.5 C) (Oral)   Resp 16   Ht 6' (1.829 m)   Wt 81.2 kg   SpO2 97%   BMI 24.28 kg/m   Physical Exam Constitutional:      General: He is not in acute distress.    Appearance: He is well-developed. He is not ill-appearing.  Neck:     Vascular: No JVD.  Cardiovascular:     Rate and Rhythm: Normal rate and  regular rhythm.     Heart sounds: No murmur. No friction rub. No gallop. No S3 or S4 sounds.   Pulmonary:     Effort: Pulmonary effort is normal. No tachypnea or respiratory distress.     Breath sounds: Normal breath sounds.  Chest:     Chest wall: No tenderness or edema.  Abdominal:     General: Bowel sounds are normal.     Palpations: Abdomen is soft.  Musculoskeletal:     Right lower leg: He exhibits tenderness. Edema present.     Left lower leg: He exhibits tenderness. Edema present.  Lymphadenopathy:     Cervical: No cervical adenopathy.  Skin:    General: Skin is warm and dry.  Neurological:     General: No focal deficit present.     Mental Status: He is alert.  Psychiatric:        Mood and Affect: Mood normal.        Behavior: Behavior normal.      Labs and Imaging: CBC BMET  Recent Labs  Lab 12/05/18 1410  WBC 8.0  HGB 15.3  HCT 47.9  PLT 244   Recent Labs  Lab 12/05/18 1410  NA 139  K 4.7  CL 100  CO2 31  BUN 9  CREATININE 0.93  GLUCOSE 103*  CALCIUM 9.4     Dg Chest 2 View  Result Date: 12/05/2018 CLINICAL DATA:  Chest pain.  Swollen feet. EXAM: CHEST - 2 VIEW COMPARISON:  03/25/2015. FINDINGS: Mediastinum hilar structures normal. Lungs are clear. No pleural effusion pneumothorax. Heart size normal. No acute bony abnormality identified. Degenerative change thoracic spine. IMPRESSION: No acute cardiopulmonary disease.  Chest is stable prior exam. Electronically Signed   By: Marcello Moores  Register   On: 12/05/2018 14:56   Dg Foot Complete Right  Result Date: 12/05/2018 CLINICAL DATA:  Right foot swelling. EXAM:  RIGHT FOOT COMPLETE - 3+ VIEW COMPARISON:  None. FINDINGS: The joint spaces are maintained. No acute bony findings or arthropathic changes. Os peroneum noted. Mild spurring change near the calcaneal attachment site of the Achilles tendon. IMPRESSION: No acute bony findings or degenerative changes. Electronically Signed   By: Marijo Sanes M.D.   On:  12/05/2018 14:56     Matilde Haymaker, MD 12/05/2018, 8:29 PM PGY-1, [place] Intern pager: [phone], text pages welcome  Resident Attestation  I saw and evaluated the patient, performing the key elements of the service.I personally performed or re-performed the history, physical exam, and medical decision making activities of this service and have verified that the service and findings are accurately documented in the resident's note.I developed the management plan that is described in the resident's note, and I agree with the content, with my edits above.   Harolyn Rutherford, DO [facility], PGY-2

## 2018-12-05 NOTE — ED Notes (Signed)
Pt in xray unable to draw blood

## 2018-12-05 NOTE — ED Notes (Signed)
ED TO INPATIENT HANDOFF REPORT  ED Nurse Name and Phone #: Chester Holstein [phone]  S Name/Age/Gender Olean Ree 68 y.o. male Room/Bed: 043C/043C  Code Status   Code Status: Prior  Home/SNF/Other Home Patient oriented to: self, place . Time ,situation Is this baseline? Yes   Triage Complete: Triage complete  Chief Complaint cp  Triage Note Patient to The ED with C/O chest pain that began 1 week ago.  States that the pain was intermittent and radiated to his neck and jaw.  Chest pain has a duration of 10 minutes.  Patient is pain free at present.   Allergies No Known Allergies  Level of Care/Admitting Diagnosis ED Disposition    ED Disposition Condition [facility] Area: [place] [[id number]]  Level of Care: Telemetry Medical [104]  Diagnosis: Chest pain [643329]  Admitting Physician: Matilde Haymaker [[id number]]  Attending Physician: Madison Hickman A [[id number]  PT Class (Do Not Modify): Observation [104]  PT Acc Code (Do Not Modify): Observation [10022]       B Medical/Surgery History Past Medical History:  Diagnosis Date  . Acute exacerbation of chronic obstructive pulmonary disease (COPD)    [name] 03/23/2015  . Alcohol abuse   . CAD (coronary artery disease)    a. s/p multi-link vision stent to the mid RCA in 2007 at [facility].  . Hyperlipidemia   . Hypertension   . Myocardial infarction [facility]) 2008   [name] 03/23/2015  . Panic attack   . Tobacco abuse    Past Surgical History:  Procedure Laterality Date  . CARDIAC CATHETERIZATION N/A 03/25/2015   Procedure: Left Heart Cath and Coronary Angiography;  Surgeon: Burnell Blanks, MD;  Location: [place] [facility];  Service: Cardiovascular;  Laterality: N/A;  . CARDIAC CATHETERIZATION N/A 03/25/2015   Procedure: Coronary Stent Intervention;  Surgeon: Burnell Blanks, MD;  Location: [place] [facility];  Service: Cardiovascular;  Laterality: N/A;  . CORONARY ANGIOPLASTY WITH STENT  PLACEMENT  2008   "1"  . TONSILLECTOMY       A IV Location/Drains/Wounds Patient Lines/Drains/Airways Status   Active Line/Drains/Airways    Name:   Placement date:   Placement time:   Site:   Days:   Peripheral IV 12/05/18 Right;Anterior;Proximal Forearm   12/05/18    1420    Forearm   less than 1          Intake/Output Last 24 hours No intake or output data in the 24 hours ending 12/05/18 1620  Labs/Imaging Results for orders placed or performed during the hospital encounter of 12/05/18 (from the past 48 hour(s))  Comprehensive metabolic panel     Status: Abnormal   Collection Time: 12/05/18  2:10 PM  Result Value Ref Range   Sodium 139 135 - 145 mmol/L   Potassium 4.7 3.5 - 5.1 mmol/L   Chloride 100 98 - 111 mmol/L   CO2 31 22 - 32 mmol/L   Glucose, Bld 103 (H) 70 - 99 mg/dL   BUN 9 8 - 23 mg/dL   Creatinine, Ser 0.93 0.61 - 1.24 mg/dL   Calcium 9.4 8.9 - 10.3 mg/dL   Total Protein 6.8 6.5 - 8.1 g/dL   Albumin 3.9 3.5 - 5.0 g/dL   AST 17 15 - 41 U/L   ALT 14 0 - 44 U/L   Alkaline Phosphatase 58 38 - 126 U/L   Total Bilirubin 0.7 0.3 - 1.2 mg/dL   GFR calc non  Af Amer >60 >60 mL/min   GFR calc Af Amer >60 >60 mL/min   Anion gap 8 5 - 15    Comment: Performed at [facility] [address] [place], [place] [address]  CBC with Differential     Status: None   Collection Time: 12/05/18  2:10 PM  Result Value Ref Range   WBC 8.0 4.0 - 10.5 K/uL   RBC 4.90 4.22 - 5.81 MIL/uL   Hemoglobin 15.3 13.0 - 17.0 g/dL   HCT 47.9 39.0 - 52.0 %   MCV 97.8 80.0 - 100.0 fL   MCH 31.2 26.0 - 34.0 pg   MCHC 31.9 30.0 - 36.0 g/dL   RDW 12.7 11.5 - 15.5 %   Platelets 244 150 - 400 K/uL   nRBC 0.0 0.0 - 0.2 %   Neutrophils Relative % 69 %   Neutro Abs 5.5 1.7 - 7.7 K/uL   Lymphocytes Relative 20 %   Lymphs Abs 1.6 0.7 - 4.0 K/uL   Monocytes Relative 9 %   Monocytes Absolute 0.7 0.1 - 1.0 K/uL   Eosinophils Relative 1 %   Eosinophils Absolute 0.1 0.0 - 0.5 K/uL    Basophils Relative 1 %   Basophils Absolute 0.1 0.0 - 0.1 K/uL   Immature Granulocytes 0 %   Abs Immature Granulocytes 0.03 0.00 - 0.07 K/uL    Comment: Performed at [facility], [address], [place], [place] [address]  Brain natriuretic peptide     Status: Abnormal   Collection Time: 12/05/18  2:10 PM  Result Value Ref Range   B Natriuretic Peptide 221.9 (H) 0.0 - 100.0 pg/mL    Comment: Performed at [facility] [address], [place], [place] [address]  I-Stat Troponin, ED (not at [facility])     Status: None   Collection Time: 12/05/18  2:28 PM  Result Value Ref Range   Troponin i, poc 0.00 0.00 - 0.08 ng/mL   Comment 3            Comment: Due to the release kinetics of cTnI, a negative result within the first hours of the onset of symptoms does not rule out myocardial infarction with certainty. If myocardial infarction is still suspected, repeat the test at appropriate intervals.    Dg Chest 2 View  Result Date: 12/05/2018 CLINICAL DATA:  Chest pain.  Swollen feet. EXAM: CHEST - 2 VIEW COMPARISON:  03/25/2015. FINDINGS: Mediastinum hilar structures normal. Lungs are clear. No pleural effusion pneumothorax. Heart size normal. No acute bony abnormality identified. Degenerative change thoracic spine. IMPRESSION: No acute cardiopulmonary disease.  Chest is stable prior exam. Electronically Signed   By: Marcello Moores  Register   On: 12/05/2018 14:56   Dg Foot Complete Right  Result Date: 12/05/2018 CLINICAL DATA:  Right foot swelling. EXAM: RIGHT FOOT COMPLETE - 3+ VIEW COMPARISON:  None. FINDINGS: The joint spaces are maintained. No acute bony findings or arthropathic changes. Os peroneum noted. Mild spurring change near the calcaneal attachment site of the Achilles tendon. IMPRESSION: No acute bony findings or degenerative changes. Electronically Signed   By: Marijo Sanes M.D.   On: 12/05/2018 14:56    Pending Labs Unresulted Labs (From admission, onward)   None       Vitals/Pain Today's Vitals   12/05/18 1518 12/05/18 1520 12/05/18 1545 12/05/18 1546  BP: (!) 129/92 (!) 129/92 (!) 170/108 (!) 156/101  Pulse: 80 80 70 70  Resp: 16 17 (!) 28 16  Temp:      TempSrc:      SpO2: 94% 94% 94% 94%  Weight:      Height:      PainSc:        Isolation Precautions No active isolations  Medications Medications  metoprolol tartrate (LOPRESSOR) tablet 12.5 mg (12.5 mg Oral Given 12/05/18 1507)  hydrALAZINE (APRESOLINE) tablet 50 mg (50 mg Oral Given 12/05/18 1505)    Mobility walks Low fall risk   Focused Assessments Cardiac Assessment Handoff:  Cardiac Rhythm: Normal sinus rhythm Lab Results  Component Value Date   TROPONINI 0.23 (H) 03/24/2015   No results found for: DDIMER Does the Patient currently have chest pain? No     R Recommendations: See Admitting Provider Note  Report given to:   Additional Notes:   Patient presents to ED with C/O intermittent chest pain X 1 week and leg pain.  Patient without chest pain but C/O leg pain.  Patient is hypertensive which was treated with hydralazine and metoprolol.  Patient  Reports not taking BP meds X 1 to 2 years.

## 2018-12-05 NOTE — ED Notes (Signed)
Called 3W to give report.  Receiving RN unable to take report at this time. 

## 2018-12-05 NOTE — ED Triage Notes (Signed)
Patient to The ED with C/O chest pain that began 1 week ago.  States that the pain was intermittent and radiated to his neck and jaw.  Chest pain has a duration of 10 minutes.  Patient is pain free at present.

## 2018-12-05 NOTE — ED Provider Notes (Signed)
[facility] EMERGENCY DEPARTMENT Provider Note   CSN: [id number] Arrival date & time: 12/05/18  1314    History   Chief Complaint Chief Complaint  Patient presents with  . Chest Pain    HPI Omar Singh is a 68 y.o. male with a PMH of COPD, alcohol abuse, CAD, HTN, and HLD presenting with intermittent sudden middle chest pain radiating to jaw onset 1.5 weeks ago. Patient reports 2 episodes. Patient reports last episode was 2 days ago. Patient describes chest pain as sharp and states it lasts a few seconds and resolves on its own. Patient states he is sleeping when chest pain episodes occur. Patient states nothing makes symptoms better or worse. Patient states he has had 2 stents placed one in 2008 and one in 2016. Patient states he has not been taking Plavix for 1 year and has not been on Eliquis for several years. Patient states he has not been following up with cardiology. Last echo was in 2016 and reveals 55-60% LVEF. Last cath was in 2016 and revealed severe stenosis of distal RCA. Patient denies DOE, SOB, chest tightness or pressure, radiation to left arm or back, nausea, or diaphoresis. Patient denies using recent travel, recent surgery, leg edema/pain, or hx of DVT/PE. Patient reports tobacco use and states he stopped consuming alcohol 1 year ago. Patient reports marijuana use 8 years ago, but denies any recent or other drug use.   Patient also reports atraumatic intermittent right foot pain onset 2-3 weeks ago. Patient states pain radiates to right knee. Patient states symptoms are worse with walking and better with rest. Patient describes pain as a cramp. Patient reports intermittent right foot edema. Patient denies fall or injury. Patient reports numbness on right foot. Patient denies weakness, paresthesias, or other skin changes. Patient denies back pain, vision changes, syncope, or headaches. Patient reports taking tylenol without relief.     HPI  Past Medical  History:  Diagnosis Date  . Acute exacerbation of chronic obstructive pulmonary disease (COPD)    [name] 03/23/2015  . Alcohol abuse   . CAD (coronary artery disease)    a. s/p multi-link vision stent to the mid RCA in 2007 at [facility].  . Hyperlipidemia   . Hypertension   . Myocardial infarction [facility]) 2008   [name] 03/23/2015  . Panic attack   . Tobacco abuse     Patient Active Problem List   Diagnosis Date Noted  . PAF (paroxysmal atrial fibrillation) ([place]) 03/26/2015  . NSTEMI (non-ST elevated myocardial infarction) ([place])   . Hypoxia 03/23/2015  . Shortness of breath 03/23/2015  . CAD S/P RCA BMS '08, new RCA BMS 03/25/15 03/23/2015  . Elevated troponin 03/23/2015  . Pulmonary nodule 03/23/2015  . Alcohol abuse 03/23/2015  . Tobacco abuse 03/23/2015  . Acute respiratory failure with hypoxemia ([place]) 03/23/2015  . COPD exacerbation ([place]) 03/23/2015    Past Surgical History:  Procedure Laterality Date  . CARDIAC CATHETERIZATION N/A 03/25/2015   Procedure: Left Heart Cath and Coronary Angiography;  Surgeon: Burnell Blanks, MD;  Location: [place] CV LAB;  Service: Cardiovascular;  Laterality: N/A;  . CARDIAC CATHETERIZATION N/A 03/25/2015   Procedure: Coronary Stent Intervention;  Surgeon: Burnell Blanks, MD;  Location: [place] [facility];  Service: Cardiovascular;  Laterality: N/A;  . CORONARY ANGIOPLASTY WITH STENT PLACEMENT  2008   "1"  . TONSILLECTOMY          Home Medications    Prior to Admission medications  Medication Sig Start Date End Date Taking? Authorizing Provider  albuterol (PROVENTIL HFA;VENTOLIN HFA) 108 (90 BASE) MCG/ACT inhaler Inhale 2 puffs into the lungs every 6 (six) hours as needed for wheezing or shortness of breath. Patient not taking: Reported on 12/05/2018 03/27/15   Nita Sells, MD  apixaban (ELIQUIS) 5 MG TABS tablet Take 1 tablet (5 mg total) by mouth 2 (two) times daily. Patient not taking: Reported on 12/05/2018  03/27/15   Nita Sells, MD  atorvastatin (LIPITOR) 40 MG tablet Take 1 tablet (40 mg total) by mouth daily at 6 PM. Patient not taking: Reported on 12/05/2018 03/27/15   Nita Sells, MD  clopidogrel (PLAVIX) 75 MG tablet Take 1 tablet (75 mg total) by mouth daily with breakfast. Patient not taking: Reported on 12/05/2018 03/27/15   Nita Sells, MD  diltiazem (CARDIZEM CD) 240 MG 24 hr capsule Take 1 capsule (240 mg total) by mouth daily. Patient not taking: Reported on 12/05/2018 03/27/15   Nita Sells, MD  hydrALAZINE (APRESOLINE) 50 MG tablet Take 1 tablet (50 mg total) by mouth every 8 (eight) hours. Patient not taking: Reported on 12/05/2018 03/27/15   Nita Sells, MD  metoprolol tartrate (LOPRESSOR) 25 MG tablet Take 0.5 tablets (12.5 mg total) by mouth 2 (two) times daily. Patient not taking: Reported on 12/05/2018 03/27/15   Nita Sells, MD  mometasone-formoterol [facility]) 200-5 MCG/ACT AERO Inhale 2 puffs into the lungs daily. Patient not taking: Reported on 12/05/2018 03/27/15   Nita Sells, MD  nicotine (NICODERM CQ - DOSED IN MG/24 HOURS) 21 mg/24hr patch Place 1 patch (21 mg total) onto the skin daily. Patient not taking: Reported on 12/05/2018 04/04/15   Boykin Nearing, MD  nicotine (NICODERM CQ) 14 mg/24hr patch Place 1 patch (14 mg total) onto the skin daily. Patient not taking: Reported on 12/05/2018 04/04/15   Boykin Nearing, MD  nicotine (NICODERM CQ) 7 mg/24hr patch Place 1 patch (7 mg total) onto the skin daily. Patient not taking: Reported on 12/05/2018 04/04/15   Boykin Nearing, MD  predniSONE (DELTASONE) 50 MG tablet Take 1 tablet (50 mg total) by mouth daily before breakfast. Patient not taking: Reported on 12/05/2018 03/27/15   Nita Sells, MD  tiotropium (SPIRIVA HANDIHALER) 18 MCG inhalation capsule Place 1 capsule (18 mcg total) into inhaler and inhale 2 (two) times daily. Patient not taking: Reported on  12/05/2018 03/27/15   Nita Sells, MD    Family History Family History  Problem Relation Age of Onset  . CVA Unknown        Grandparents had strokes    Social History Social History   Tobacco Use  . Smoking status: Former Smoker    Packs/day: 1.00    Years: 47.00    Pack years: 47.00    Types: Cigarettes    Last attempt to quit: 03/24/2015    Years since quitting: 3.7  . Smokeless tobacco: Never Used  Substance Use Topics  . Alcohol use: Yes    Alcohol/week: 56.0 standard drinks    Types: 56 Cans of beer per week    Comment: 6-8 beers nightly  . Drug use: No     Allergies   Patient has no known allergies.   Review of Systems Review of Systems  Constitutional: Negative for activity change, appetite change, chills, diaphoresis, fatigue, fever and unexpected weight change.  HENT: Negative for congestion and rhinorrhea.   Respiratory: Negative for cough, chest tightness, shortness of breath and wheezing.   Cardiovascular: Positive for chest pain. Negative  for palpitations and leg swelling.  Gastrointestinal: Negative for abdominal pain, nausea and vomiting.  Endocrine: Negative for cold intolerance and heat intolerance.  Musculoskeletal: Positive for arthralgias and joint swelling. Negative for back pain.  Skin: Negative for rash.  Allergic/Immunologic: Negative for immunocompromised state.  Neurological: Negative for dizziness, syncope, weakness and light-headedness.  Psychiatric/Behavioral: Negative for agitation and behavioral problems. The patient is not nervous/anxious.     Physical Exam Updated Vital Signs BP (!) 156/101 (BP Location: Left Arm)   Pulse 70   Temp 98.4 F (36.9 C) (Oral)   Resp 16   Ht 6' (1.829 m)   Wt 81.2 kg   SpO2 94%   BMI 24.28 kg/m   Physical Exam Vitals signs and nursing note reviewed.  Constitutional:      General: He is not in acute distress.    Appearance: He is well-developed. He is not diaphoretic.  HENT:      Head: Normocephalic and atraumatic.  Eyes:     Extraocular Movements: Extraocular movements intact.     Pupils: Pupils are equal, round, and reactive to light.  Neck:     Musculoskeletal: Normal range of motion and neck supple.     Vascular: No JVD.  Cardiovascular:     Rate and Rhythm: Normal rate and regular rhythm.     Pulses: Normal pulses.          Radial pulses are 2+ on the right side and 2+ on the left side.       Dorsalis pedis pulses are 2+ on the right side and 2+ on the left side.     Heart sounds: Normal heart sounds. No murmur. No friction rub. No gallop.   Pulmonary:     Effort: Pulmonary effort is normal. No respiratory distress.     Breath sounds: Normal breath sounds. No wheezing or rales.  Chest:     Chest wall: No tenderness.  Abdominal:     Palpations: Abdomen is soft.     Tenderness: There is no abdominal tenderness.  Musculoskeletal: Normal range of motion.     Right ankle: He exhibits normal range of motion, no swelling and no ecchymosis. Tenderness. Head of 5th metatarsal tenderness found. No lateral malleolus, no medial malleolus, no AITFL, no CF ligament and no posterior TFL tenderness found.     Left ankle: He exhibits normal range of motion, no swelling and no ecchymosis. No tenderness.     Right lower leg: He exhibits no tenderness. No edema.     Left lower leg: He exhibits no tenderness. No edema.  Feet:     Right foot:     Skin integrity: Skin integrity normal. No ulcer, blister or skin breakdown.     Left foot:     Skin integrity: Skin integrity normal. No ulcer, blister or skin breakdown.  Skin:    General: Skin is warm.     Capillary Refill: Capillary refill takes less than 2 seconds.     Coloration: Skin is not pale.     Findings: No rash.  Neurological:     Mental Status: He is alert and oriented to person, place, and time.    Mental Status:  Alert, oriented, thought content appropriate, able to give a coherent history. Speech fluent  without evidence of aphasia. Able to follow 2 step commands without difficulty.  Cranial Nerves:  II:  Peripheral visual fields grossly normal, pupils equal, round, reactive to light III,IV, VI: ptosis not present, extra-ocular motions intact bilaterally  V,VII: smile symmetric, facial light touch sensation equal VIII: hearing grossly normal to voice  X: uvula elevates symmetrically  XI: bilateral shoulder shrug symmetric and strong XII: midline tongue extension without fassiculations Motor:  Normal tone. 5/5 in upper extremities bilaterally including strong and equal grip strength. 5/5 in lower left extremity with dorsiflexion/plantar flexion. 4/5 in lower right extremity with dorsiflexion and plantar flexion due to pain.  Sensory: Decreased sensation on right foot, light touch normal in all other extremities.  Deep Tendon Reflexes: 2+ and symmetric in the biceps and patella Cerebellar: normal finger-to-nose with bilateral upper extremities Gait: normal gait and balance.  Negative pronator drift. Negative Romberg sign. CV: distal pulses palpable throughout   ED Treatments / Results  Labs (all labs ordered are listed, but only abnormal results are displayed) Labs Reviewed  COMPREHENSIVE METABOLIC PANEL - Abnormal; Notable for the following components:      Result Value   Glucose, Bld 103 (*)    All other components within normal limits  BRAIN NATRIURETIC PEPTIDE - Abnormal; Notable for the following components:   B Natriuretic Peptide 221.9 (*)    All other components within normal limits  CBC WITH DIFFERENTIAL/PLATELET  I-STAT TROPONIN, ED    EKG EKG Interpretation  Date/Time:  Friday December 05 2018 13:55:30 EDT Ventricular Rate:  91 PR Interval:    QRS Duration: 136 QT Interval:  383 QTC Calculation: 466 R Axis:   -109 Text Interpretation:  Sinus rhythm Ventricular bigeminy RBBB and LAFB Baseline wander in lead(s) V5 Abnormal ekg Confirmed by Carmin Muskrat [phone]) on  12/05/2018 2:09:28 PM  Radiology Dg Chest 2 View  Result Date: 12/05/2018 CLINICAL DATA:  Chest pain.  Swollen feet. EXAM: CHEST - 2 VIEW COMPARISON:  03/25/2015. FINDINGS: Mediastinum hilar structures normal. Lungs are clear. No pleural effusion pneumothorax. Heart size normal. No acute bony abnormality identified. Degenerative change thoracic spine. IMPRESSION: No acute cardiopulmonary disease.  Chest is stable prior exam. Electronically Signed   By: Marcello Moores  Register   On: 12/05/2018 14:56   Dg Foot Complete Right  Result Date: 12/05/2018 CLINICAL DATA:  Right foot swelling. EXAM: RIGHT FOOT COMPLETE - 3+ VIEW COMPARISON:  None. FINDINGS: The joint spaces are maintained. No acute bony findings or arthropathic changes. Os peroneum noted. Mild spurring change near the calcaneal attachment site of the Achilles tendon. IMPRESSION: No acute bony findings or degenerative changes. Electronically Signed   By: Marijo Sanes M.D.   On: 12/05/2018 14:56    Procedures Procedures (including critical care time)  Medications Ordered in ED Medications  metoprolol tartrate (LOPRESSOR) tablet 12.5 mg (12.5 mg Oral Given 12/05/18 1507)  hydrALAZINE (APRESOLINE) tablet 50 mg (50 mg Oral Given 12/05/18 1505)     Initial Impression / Assessment and Plan / ED Course  I have reviewed the triage vital signs and the nursing notes.  Pertinent labs & imaging results that were available during my care of the patient were reviewed by me and considered in my medical decision making (see chart for details).  Clinical Course as of Dec 05 1599  Fri Dec 05, 2018  1459 No acute cardiopulmonary disease noted on CXR.  DG Chest 2 View [AH]  1459 Negative foot x ray.  DG Foot Complete Right [AH]  1459 CBC is unremarkable.  CBC with Differential [AH]    Clinical Course User Index [AH] Arville Lime, PA-C      Concern for cardiac etiology of Chest Pain due to risk factors. Patient has a  heart score of 5. Patient  has been asymptomatic while in the ER. Suspect foot pain is likely due to intermittent claudication based on history and physical exam. X ray was negative for an acute bony abnormality. Patient is able to ambulate and has 2+ DP pulses. Pt has been re-evaluated prior to consult and VSS, NAD, heart RRR, pain 0/10, lungs CTAB. EKG reveals a RBBB and ventricular bigeminy. First round of cardiac enzymes negative. Blood pressure has been elevated. Provided home blood pressure medications. Patient has not been compliant with medications for one year. Patient will need admission due to chest pain rule out and hypertensive urgency. This case was discussed with Dr. Vanita Panda who has seen the patient and agrees with plan to admit. Consulted hospitalist. Hospitalist has agreed to admit patient.   Final Clinical Impressions(s) / ED Diagnoses   Final diagnoses:  Nonspecific chest pain  Hypertensive urgency    ED Discharge Orders    None       Arville Lime, [place] 12/05/18 1602    Carmin Muskrat, MD 12/06/18 [phone]

## 2018-12-05 NOTE — Progress Notes (Signed)
Patient arrived to the Unit from ED Alert and Oriented X 4 Denies Pain Patient walked from the stretcher to the bathroom and then to the bed. Patient has a steady gait. Place on tele box #24 Skin Clean dry and intact All questions and concerns addressed, Bed int the lowest position with call light in reach.

## 2018-12-05 NOTE — Progress Notes (Signed)
Patient BP 192/93, he is not symptomatic Nurse Paged Family Medicine Teaching Service.  Nurse waiting for Orders. Will continue to monitor

## 2018-12-06 ENCOUNTER — Other Ambulatory Visit (HOSPITAL_COMMUNITY): Payer: Medicare PPO

## 2018-12-06 ENCOUNTER — Encounter (HOSPITAL_COMMUNITY): Payer: Medicare PPO

## 2018-12-06 DIAGNOSIS — Z91120 Patient's intentional underdosing of medication regimen due to financial hardship: Secondary | ICD-10-CM

## 2018-12-06 DIAGNOSIS — J449 Chronic obstructive pulmonary disease, unspecified: Secondary | ICD-10-CM | POA: Diagnosis not present

## 2018-12-06 DIAGNOSIS — R0789 Other chest pain: Secondary | ICD-10-CM | POA: Diagnosis not present

## 2018-12-06 DIAGNOSIS — G4733 Obstructive sleep apnea (adult) (pediatric): Secondary | ICD-10-CM | POA: Diagnosis not present

## 2018-12-06 DIAGNOSIS — R2241 Localized swelling, mass and lump, right lower limb: Secondary | ICD-10-CM | POA: Diagnosis not present

## 2018-12-06 DIAGNOSIS — I10 Essential (primary) hypertension: Secondary | ICD-10-CM | POA: Diagnosis not present

## 2018-12-06 DIAGNOSIS — I160 Hypertensive urgency: Secondary | ICD-10-CM

## 2018-12-06 DIAGNOSIS — I161 Hypertensive emergency: Secondary | ICD-10-CM | POA: Diagnosis not present

## 2018-12-06 DIAGNOSIS — I25709 Atherosclerosis of coronary artery bypass graft(s), unspecified, with unspecified angina pectoris: Secondary | ICD-10-CM | POA: Diagnosis not present

## 2018-12-06 DIAGNOSIS — Z9114 Patient's other noncompliance with medication regimen: Secondary | ICD-10-CM

## 2018-12-06 DIAGNOSIS — T46906A Underdosing of unspecified agents primarily affecting the cardiovascular system, initial encounter: Secondary | ICD-10-CM

## 2018-12-06 DIAGNOSIS — I2510 Atherosclerotic heart disease of native coronary artery without angina pectoris: Secondary | ICD-10-CM | POA: Diagnosis not present

## 2018-12-06 DIAGNOSIS — I739 Peripheral vascular disease, unspecified: Secondary | ICD-10-CM | POA: Diagnosis not present

## 2018-12-06 DIAGNOSIS — Z79899 Other long term (current) drug therapy: Secondary | ICD-10-CM | POA: Diagnosis not present

## 2018-12-06 DIAGNOSIS — I259 Chronic ischemic heart disease, unspecified: Secondary | ICD-10-CM | POA: Diagnosis not present

## 2018-12-06 LAB — BASIC METABOLIC PANEL
Anion gap: 10 (ref 5–15)
BUN: 13 mg/dL (ref 8–23)
CO2: 28 mmol/L (ref 22–32)
Calcium: 9.5 mg/dL (ref 8.9–10.3)
Chloride: 99 mmol/L (ref 98–111)
Creatinine, Ser: 0.9 mg/dL (ref 0.61–1.24)
GFR calc Af Amer: >60 mL/min (ref >60)
GFR calc non Af Amer: >60 mL/min (ref >60)
Glucose, Bld: 88 mg/dL (ref 70–99)
POTASSIUM: 4.4 mmol/L (ref 3.5–5.1)
Sodium: 137 mmol/L (ref 135–145)

## 2018-12-06 LAB — HIV ANTIBODY (ROUTINE TESTING W REFLEX): HIV Screen 4th Generation wRfx: NONREACTIVE

## 2018-12-06 LAB — CBC
HCT: 47.7 % (ref 39.0–52.0)
Hemoglobin: 15.3 g/dL (ref 13.0–17.0)
MCH: 31.1 pg (ref 26.0–34.0)
MCHC: 32.1 g/dL (ref 30.0–36.0)
MCV: 97 fL (ref 80.0–100.0)
Platelets: 268 10*3/uL (ref 150–400)
RBC: 4.92 MIL/uL (ref 4.22–5.81)
RDW: 12.9 % (ref 11.5–15.5)
WBC: 8.1 10*3/uL (ref 4.0–10.5)
nRBC: 0 % (ref 0.0–0.2)

## 2018-12-06 LAB — LIPID PANEL
CHOLESTEROL: 198 mg/dL (ref 0–200)
HDL: 37 mg/dL — ABNORMAL LOW (ref >40)
LDL Cholesterol: 133 mg/dL — ABNORMAL HIGH (ref 0–99)
Total CHOL/HDL Ratio: 5.4 RATIO
Triglycerides: 142 mg/dL (ref <150)
VLDL: 28 mg/dL (ref 0–40)

## 2018-12-06 LAB — HEMOGLOBIN A1C
Hgb A1c MFr Bld: 5.8 % — ABNORMAL HIGH (ref 4.8–5.6)
Mean Plasma Glucose: 119.76 mg/dL

## 2018-12-06 LAB — TSH: TSH: 0.644 u[IU]/mL (ref 0.350–4.500)

## 2018-12-06 LAB — TROPONIN I
Troponin I: <0.03 ng/mL (ref <0.03)
Troponin I: <0.03 ng/mL (ref <0.03)

## 2018-12-06 MED ORDER — LOSARTAN POTASSIUM 25 MG PO TABS: 25.0000 mg | ORAL_TABLET | Freq: Every day | ORAL | Status: DC

## 2018-12-06 MED ORDER — LISINOPRIL 5 MG PO TABS
5.0000 mg | ORAL_TABLET | Freq: Every day | ORAL | Status: DC
  Administered 2018-12-06: 5 mg via ORAL
  Filled 2018-12-06: qty 1

## 2018-12-06 MED ORDER — LOSARTAN POTASSIUM 25 MG PO TABS
25.0000 mg | ORAL_TABLET | Freq: Every day | ORAL | Status: DC
  Administered 2018-12-07: 25 mg via ORAL
  Filled 2018-12-06: qty 1

## 2018-12-06 MED ORDER — ATORVASTATIN CALCIUM 40 MG PO TABS
40.0000 mg | ORAL_TABLET | Freq: Every day | ORAL | Status: DC
  Administered 2018-12-06: 40 mg via ORAL
  Filled 2018-12-06: qty 1

## 2018-12-06 NOTE — Progress Notes (Signed)
Family Medicine Teaching Service Daily Progress Note Intern Pager: [phone]  Patient name: Omar Singh Medical record number: [id number] Date of birth: [birth year] Age: 68 y.o. Gender: male  Primary Care Provider: Patient, No Pcp Per Consultants: none Code Status: Full  Pt Overview and Major Events to Date:  3/20 - admitted for ACS r/o, leg pain  Assessment and Plan: Omar Singh is a 68 y.o. male who presented with leg pain and CP. PMH is significant for CAD (s/p PCI in 2008, 2017), hypertension, A. fib, COPD, OSA, smoking history.  Hypertension. BP this morning at 151/92, still elevated but improved. - metoprolol 25mg  BID - received lisinopril 5mg  yesterday and starting losartan 25mg  qd today, can increase to 50mg  qd if BPs remain uncontrolled    Atypical CP with h/o CAD s/p RCA stent 2016. Resolved. ACS ruled out.  - ASA81 mg qd - metoprolol 25mg  BID - atorvastatin 40mg  qd  Chronic leg claudication. No c/o limb ischemia. - will need ABIs as outpatient - patient counseled on home walking program - statin and ASA as per above   Medication noncompliance d/t financial issues  - CM consulted for medication assistance   COPD w/ OSA. Stable on RA. -Continue to monitor  FEN/GI: heart healthy diet Prophylaxis: Lovenox   Disposition: pending BP control, likely home this afternoon  Subjective:  Doing well this morning, no complaints. Wants to go home. States that he can afford medications if they are affordable He does have chronic leg pain when walking that is currently not bothering him  Objective: Temp:  [97.8 F (36.6 C)-98.3 F (36.8 C)] 98 F (36.7 C) (03/22 0328) Pulse Rate:  [62-75] 75 (03/22 0328) Resp:  [16-18] 18 (03/22 0328) BP: (151-209)/(74-97) 151/92 (03/22 0328) SpO2:  [92 %-97 %] 96 % (03/22 0328) Weight:  [85.3 kg] 85.3 kg (03/22 0328) Physical Exam: General: laying in bed comfortably, in NAD Cardiovascular: RRR, no murmurs Respiratory: CTAB,  NWOB on RA Abdomen: soft, nontender, nondistended, + bowel sounds Extremities: WWP, no LE edema  Laboratory: Recent Labs  Lab 12/05/18 1410 12/06/18 0522  WBC 8.0 8.1  HGB 15.3 15.3  HCT 47.9 47.7  PLT 244 268   Recent Labs  Lab 12/05/18 1410 12/06/18 0522 12/07/18 0526  NA 139 137 135  K 4.7 4.4 4.3  CL 100 99 98  CO2 31 28 28   BUN 9 13 15   CREATININE 0.93 0.90 0.96  CALCIUM 9.4 9.5 9.1  PROT 6.8  --   --   BILITOT 0.7  --   --   ALKPHOS 58  --   --   ALT 14  --   --   AST 17  --   --   GLUCOSE 103* 88 98   Imaging/Diagnostic Tests: No results found.   Bufford Lope, DO 12/07/2018, 6:26 AM PGY-3, [place] Intern pager: [phone], text pages welcome

## 2018-12-06 NOTE — Progress Notes (Addendum)
Family Medicine Teaching Service Daily Progress Note Intern Pager: [phone]  Patient name: Omar Singh Medical record number: [id number] Date of birth: 09/20/1950 Age: 68 y.o. Gender: male  Primary Care Provider: Patient, No Pcp Per Consultants: Cardiology Code Status: Full  Pt Overview and Major Events to Date:  3/20 - admitted for ACS r/o, leg pain  Assessment and Plan: Omar Singh is a 68 y.o. male presenting with leg pain and admitted for ACS rule out. PMH is significant for His previous medical history is significant for CAD (s/p PCI in 2008, 2017), hypertension, A. fib, COPD, smoking history.  ACS rule out w/ h/o CAD s/p RCA stent 2016 Intermittent atypical chest pain resolved prior to admission. Has not required prn nitro. trops neg x3. A1c prediabetic. LDL 133, on moderate intensity statin, will likely increase to high intensity today. EKG with septal Twave inversions, though not new. Last ECHO in 2016 with LVEF 55-60% without WMA. No events seen on tele overnight. - ASA - metoprolol - increase atorvastatin to 40mg   Hypertensive urgency BP elevated overnight, most recent 209/94. No evidence of organ dysfunction. On no meds at home. - continues on metoprolol 25mg  BID - start lisinopril - consider prn hydral if needed  Leg claudication Reported intermittent leg pain with exertion on admission. - f/u ABI - statin  History of A. fib, paroxysmal Per chart review, he has a history of paroxysmal A. fib.  EKG this am sinus rhythm.  He takes no home medications. - monitor  Social circumstances Omar Singh reports that he is not able to afford his medication.  Is the primary reason that he takes no medication at home currently.  Based on his above medical issues, he should be on at least aspirin, statin, beta-blocker.  He appears willing to take these medications if they can be obtained affordably. - CM consult  COPD w/ OSA He has an extensive smoking history  although currently smokes about 1 pack/week.  He previously smoked 2 packs/day for 40 to 50 years.  On physical exam on admission he had only mild expiratory wheezing noted some fields, he was breathing comfortably on room air.  He takes no controller medication at home. -Continue to monitor  FEN/GI: Cardiac diet Prophylaxis: Lovenox  Disposition: likely d/c home tomorrow pending BP control  Subjective:  No pain today. Leg pain is chronic, worsened with walking. Will also get pain in legs when hanging down while sitting.  Objective: Temp:  [97.7 F (36.5 C)-98.4 F (36.9 C)] 97.8 F (36.6 C) (03/21 0330) Pulse Rate:  [65-100] 72 (03/21 0836) Resp:  [14-28] 18 (03/21 0836) BP: (129-227)/(71-116) 209/94 (03/21 0836) SpO2:  [91 %-97 %] 92 % (03/21 0836) Weight:  [81.2 kg-85.6 kg] 85.6 kg (03/21 0511) Physical Exam: General: sitting in chair, in NAD Cardiovascular: RRR, no mrg Respiratory: CTAB with slight end expiratory wheezing, normal WOB on RA Abdomen: soft, NTND, +BS Extremities: warm, dry, elevated in recliner. No erythema, cyanosis, or paleness to skin. No ulcers or wounds. Not TTP. No LE swelling noted.  Laboratory: Recent Labs  Lab 12/05/18 1410 12/06/18 0522  WBC 8.0 8.1  HGB 15.3 15.3  HCT 47.9 47.7  PLT 244 268   Recent Labs  Lab 12/05/18 1410 12/06/18 0522  NA 139 137  K 4.7 4.4  CL 100 99  CO2 31 28  BUN 9 13  CREATININE 0.93 0.90  CALCIUM 9.4 9.5  PROT 6.8  --   BILITOT 0.7  --  ALKPHOS 58  --   ALT 14  --   AST 17  --   GLUCOSE 103* 88   Imaging/Diagnostic Tests: Dg Chest 2 View  Result Date: 12/05/2018 CLINICAL DATA:  Chest pain.  Swollen feet. EXAM: CHEST - 2 VIEW COMPARISON:  03/25/2015. FINDINGS: Mediastinum hilar structures normal. Lungs are clear. No pleural effusion pneumothorax. Heart size normal. No acute bony abnormality identified. Degenerative change thoracic spine. IMPRESSION: No acute cardiopulmonary disease.  Chest is stable  prior exam. Electronically Signed   By: Marcello Moores  Register   On: 12/05/2018 14:56   Dg Foot Complete Right  Result Date: 12/05/2018 CLINICAL DATA:  Right foot swelling. EXAM: RIGHT FOOT COMPLETE - 3+ VIEW COMPARISON:  None. FINDINGS: The joint spaces are maintained. No acute bony findings or arthropathic changes. Os peroneum noted. Mild spurring change near the calcaneal attachment site of the Achilles tendon. IMPRESSION: No acute bony findings or degenerative changes. Electronically Signed   By: Marijo Sanes M.D.   On: 12/05/2018 14:56   Rory Percy, DO 12/06/2018, 8:45 AM PGY-2, [place] Intern pager: [phone], text pages welcome

## 2018-12-06 NOTE — Progress Notes (Signed)
Paged MD due to pt's BP still being elevated. MD returned page and is reviewing the chart and will place additional orders if necessary. RN will continue to monitor pt status.

## 2018-12-06 NOTE — Plan of Care (Signed)
  Problem: Clinical Measurements: Goal: Ability to maintain clinical measurements within normal limits will improve Outcome: Progressing   Problem: Clinical Measurements: Goal: Cardiovascular complication will be avoided Outcome: Progressing   Problem: Coping: Goal: Level of anxiety will decrease Outcome: Progressing   

## 2018-12-06 NOTE — Progress Notes (Signed)
BP=168/97. HR=66. Dr. Tammi Klippel was made aware and said continue to monitor.

## 2018-12-07 DIAGNOSIS — Z79899 Other long term (current) drug therapy: Secondary | ICD-10-CM | POA: Diagnosis not present

## 2018-12-07 DIAGNOSIS — I25709 Atherosclerosis of coronary artery bypass graft(s), unspecified, with unspecified angina pectoris: Secondary | ICD-10-CM | POA: Diagnosis not present

## 2018-12-07 DIAGNOSIS — I739 Peripheral vascular disease, unspecified: Secondary | ICD-10-CM

## 2018-12-07 DIAGNOSIS — R2241 Localized swelling, mass and lump, right lower limb: Secondary | ICD-10-CM | POA: Diagnosis not present

## 2018-12-07 DIAGNOSIS — I161 Hypertensive emergency: Secondary | ICD-10-CM | POA: Diagnosis not present

## 2018-12-07 DIAGNOSIS — I259 Chronic ischemic heart disease, unspecified: Secondary | ICD-10-CM | POA: Diagnosis not present

## 2018-12-07 DIAGNOSIS — J449 Chronic obstructive pulmonary disease, unspecified: Secondary | ICD-10-CM | POA: Diagnosis not present

## 2018-12-07 DIAGNOSIS — I160 Hypertensive urgency: Secondary | ICD-10-CM | POA: Diagnosis not present

## 2018-12-07 DIAGNOSIS — G4733 Obstructive sleep apnea (adult) (pediatric): Secondary | ICD-10-CM | POA: Diagnosis not present

## 2018-12-07 DIAGNOSIS — I10 Essential (primary) hypertension: Secondary | ICD-10-CM | POA: Diagnosis not present

## 2018-12-07 DIAGNOSIS — I2510 Atherosclerotic heart disease of native coronary artery without angina pectoris: Secondary | ICD-10-CM | POA: Diagnosis not present

## 2018-12-07 DIAGNOSIS — Z91120 Patient's intentional underdosing of medication regimen due to financial hardship: Secondary | ICD-10-CM | POA: Diagnosis not present

## 2018-12-07 DIAGNOSIS — Z9114 Patient's other noncompliance with medication regimen: Secondary | ICD-10-CM | POA: Diagnosis not present

## 2018-12-07 DIAGNOSIS — R0789 Other chest pain: Secondary | ICD-10-CM | POA: Diagnosis not present

## 2018-12-07 LAB — BASIC METABOLIC PANEL
Anion gap: 9 (ref 5–15)
BUN: 15 mg/dL (ref 8–23)
CO2: 28 mmol/L (ref 22–32)
Calcium: 9.1 mg/dL (ref 8.9–10.3)
Chloride: 98 mmol/L (ref 98–111)
Creatinine, Ser: 0.96 mg/dL (ref 0.61–1.24)
GFR calc Af Amer: >60 mL/min (ref >60)
GFR calc non Af Amer: >60 mL/min (ref >60)
Glucose, Bld: 98 mg/dL (ref 70–99)
Potassium: 4.3 mmol/L (ref 3.5–5.1)
SODIUM: 135 mmol/L (ref 135–145)

## 2018-12-07 MED ORDER — METOPROLOL TARTRATE 25 MG PO TABS: 25.0000 mg | ORAL_TABLET | Freq: Two times a day (BID) | ORAL | 0 refills | Status: DC

## 2018-12-07 MED ORDER — ATORVASTATIN CALCIUM 40 MG PO TABS: 40.0000 mg | ORAL_TABLET | Freq: Every day | ORAL | 0 refills | Status: DC

## 2018-12-07 MED ORDER — ASPIRIN 81 MG PO TBEC: 81.0000 mg | DELAYED_RELEASE_TABLET | Freq: Every day | ORAL | 0 refills | Status: DC

## 2018-12-07 MED ORDER — LOSARTAN POTASSIUM 50 MG PO TABS: 50.0000 mg | ORAL_TABLET | Freq: Every day | ORAL | 0 refills | Status: DC

## 2018-12-07 MED ORDER — HYDRALAZINE HCL 20 MG/ML IJ SOLN
10.0000 mg | Freq: Once | INTRAMUSCULAR | Status: AC
  Administered 2018-12-07: 10 mg via INTRAVENOUS
  Filled 2018-12-07: qty 1

## 2018-12-07 NOTE — Progress Notes (Signed)
AVS reviewed with patient and patient given a copy. Patient dressed, all lines removed, and belongings packed. Patient taken via wheelchair to wife's car for discharge.

## 2018-12-07 NOTE — Discharge Instructions (Signed)
It has been a pleasure taking care of you! You were admitted due to leg pain. We have treated you with cholesterol medications and blood pressure medications. With that your symptoms improved to the point we think it is safe to let you go home and follow up with your primary care doctor.   It is important to take the medications that you were started you on here in the hospital: Metoprolol, Losartan, Aspirin, and Atorvastatin.  Please continue a walking program at home. Walk up almost to the point of discomfort, rest, and repeat.   Please establish with a primary care doctor's office. Please find some attached options for you to call.

## 2018-12-07 NOTE — Discharge Summary (Addendum)
[facility] Discharge Summary  Patient name: Omar Singh Medical record number: [id number] Date of birth: 06/23/1951 Age: 68 y.o. Gender: male Date of Admission: 12/05/2018  Date of Discharge: 12/07/2018 Admitting Physician: Zenia Resides, MD  Primary Care Provider: Patient, No Pcp Per Consultants: none  Indication for Hospitalization: atypical chest pain  Discharge Diagnoses/Problem List:  Atypical CP with h/o CAD  Chronic leg claudication HTN H/o Afib COPD OSA  Disposition: home  Discharge Condition: stable, improved  Discharge Exam:  General: laying in bed comfortably, in NAD Cardiovascular: RRR, no murmurs Respiratory: CTAB, NWOB on RA Abdomen: soft, nontender, nondistended, + bowel sounds Extremities: WWP, no LE edema  Brief Hospital Course:  Omar Singh a 68 y.o.malewith PMH  significant for CAD (s/p PCI in 2008, 2017), hypertension, A. fib, COPD, OSA, smoking history who presented with leg pain and CP. He was admitted and ACS was ruled out as troponins were neg x3. During admission his BPs were elevated up to 209/94. He was started on losartan with improvement in BPs. For his chronic leg claudication, ABIs were not able to be done inpatient. He was started on atorvastatin and ASA and counseled on walking program.   Issues for Follow Up:  1. Started on losartan 50mg , monitor BPs and titrate up as necessary. Will need BMP recheck in 4 weeks 2. Please ensure he is taking ASA, Losartan, Metoprolol, and Atorvastatin at follow up. These are $4 [employer] medications 3. Needs outpatient ABIs for chronic leg claudication 4. Has medication noncompliance from financial difficulties, could benefit from medication assistance  Significant Procedures: none  Significant Labs and Imaging:  Recent Labs  Lab 12/05/18 1410 12/06/18 0522  WBC 8.0 8.1  HGB 15.3 15.3  HCT 47.9 47.7  PLT 244 268   Recent Labs  Lab 12/05/18 1410  12/06/18 0522 12/07/18 0526  NA 139 137 135  K 4.7 4.4 4.3  CL 100 99 98  CO2 31 28 28   GLUCOSE 103* 88 98  BUN 9 13 15   CREATININE 0.93 0.90 0.96  CALCIUM 9.4 9.5 9.1  ALKPHOS 58  --   --   AST 17  --   --   ALT 14  --   --   ALBUMIN 3.9  --   --      Results/Tests Pending at Time of Discharge: none  Discharge Medications:  Allergies as of 12/07/2018   No Known Allergies     Medication List    STOP taking these medications   apixaban 5 MG Tabs tablet Commonly known as:  ELIQUIS   clopidogrel 75 MG tablet Commonly known as:  PLAVIX   diltiazem 240 MG 24 hr capsule Commonly known as:  CARDIZEM CD   hydrALAZINE 50 MG tablet Commonly known as:  APRESOLINE   nicotine 14 mg/24hr patch Commonly known as:  Nicoderm CQ   nicotine 21 mg/24hr patch Commonly known as:  NICODERM CQ - dosed in mg/24 hours   nicotine 7 mg/24hr patch Commonly known as:  Nicoderm CQ   predniSONE 50 MG tablet Commonly known as:  DELTASONE     TAKE these medications   albuterol 108 (90 Base) MCG/ACT inhaler Commonly known as:  PROVENTIL HFA;VENTOLIN HFA Inhale 2 puffs into the lungs every 6 (six) hours as needed for wheezing or shortness of breath.   aspirin 81 MG EC tablet Take 1 tablet (81 mg total) by mouth daily.   atorvastatin 40 MG tablet Commonly known as:  LIPITOR  Take 1 tablet (40 mg total) by mouth daily at 6 PM.   losartan 50 MG tablet Commonly known as:  COZAAR Take 1 tablet (50 mg total) by mouth daily.   metoprolol tartrate 25 MG tablet Commonly known as:  LOPRESSOR Take 1 tablet (25 mg total) by mouth 2 (two) times daily for 30 days. What changed:  how much to take   mometasone-formoterol 200-5 MCG/ACT Aero Commonly known as:  DULERA Inhale 2 puffs into the lungs daily.   tiotropium 18 MCG inhalation capsule Commonly known as:  Spiriva HandiHaler Place 1 capsule (18 mcg total) into inhaler and inhale 2 (two) times daily.       Discharge Instructions:  Please refer to Patient Instructions section of EMR for full details.  Patient was counseled important signs and symptoms that should prompt return to medical care, changes in medications, dietary instructions, activity restrictions, and follow up appointments.   Follow-Up Appointments: Follow-up [facility] up.   Contact information: [place] [id number] [facility].   Contact information: [place]          Nuala Alpha, DO 12/07/2018, 1:06 PM PGY-2, [place]

## 2019-01-05 ENCOUNTER — Ambulatory Visit (HOSPITAL_COMMUNITY)
Admission: EM | Admit: 2019-01-05 | Discharge: 2019-01-05 | Disposition: A | Discharge status: Home / Self Care | Payer: Medicare PPO | Source: Home / Self Care

## 2019-01-05 ENCOUNTER — Encounter (HOSPITAL_COMMUNITY): Payer: Self-pay

## 2019-01-05 ENCOUNTER — Inpatient Hospital Stay (HOSPITAL_COMMUNITY)
Admission: EM | Admit: 2019-01-05 | Discharge: 2019-01-09 | DRG: 272 | Disposition: A | Discharge status: Home / Self Care | Payer: Medicare PPO | Attending: Vascular Surgery | Admitting: Vascular Surgery

## 2019-01-05 ENCOUNTER — Other Ambulatory Visit: Payer: Self-pay

## 2019-01-05 ENCOUNTER — Emergency Department (HOSPITAL_COMMUNITY): Payer: Medicare PPO

## 2019-01-05 ENCOUNTER — Encounter (HOSPITAL_COMMUNITY): Payer: Self-pay | Admitting: Emergency Medicine

## 2019-01-05 DIAGNOSIS — Z79899 Other long term (current) drug therapy: Secondary | ICD-10-CM

## 2019-01-05 DIAGNOSIS — I10 Essential (primary) hypertension: Secondary | ICD-10-CM | POA: Diagnosis not present

## 2019-01-05 DIAGNOSIS — I739 Peripheral vascular disease, unspecified: Secondary | ICD-10-CM

## 2019-01-05 DIAGNOSIS — I70211 Atherosclerosis of native arteries of extremities with intermittent claudication, right leg: Secondary | ICD-10-CM | POA: Diagnosis not present

## 2019-01-05 DIAGNOSIS — Z955 Presence of coronary angioplasty implant and graft: Secondary | ICD-10-CM

## 2019-01-05 DIAGNOSIS — Z716 Tobacco abuse counseling: Secondary | ICD-10-CM

## 2019-01-05 DIAGNOSIS — F17210 Nicotine dependence, cigarettes, uncomplicated: Secondary | ICD-10-CM | POA: Diagnosis present

## 2019-01-05 DIAGNOSIS — R600 Localized edema: Secondary | ICD-10-CM

## 2019-01-05 DIAGNOSIS — F1010 Alcohol abuse, uncomplicated: Secondary | ICD-10-CM | POA: Diagnosis not present

## 2019-01-05 DIAGNOSIS — J9601 Acute respiratory failure with hypoxia: Secondary | ICD-10-CM | POA: Diagnosis not present

## 2019-01-05 DIAGNOSIS — Z823 Family history of stroke: Secondary | ICD-10-CM | POA: Diagnosis not present

## 2019-01-05 DIAGNOSIS — Z7951 Long term (current) use of inhaled steroids: Secondary | ICD-10-CM

## 2019-01-05 DIAGNOSIS — I70201 Unspecified atherosclerosis of native arteries of extremities, right leg: Secondary | ICD-10-CM | POA: Diagnosis present

## 2019-01-05 DIAGNOSIS — I480 Paroxysmal atrial fibrillation: Secondary | ICD-10-CM | POA: Diagnosis present

## 2019-01-05 DIAGNOSIS — Z09 Encounter for follow-up examination after completed treatment for conditions other than malignant neoplasm: Secondary | ICD-10-CM

## 2019-01-05 DIAGNOSIS — J449 Chronic obstructive pulmonary disease, unspecified: Secondary | ICD-10-CM | POA: Diagnosis present

## 2019-01-05 DIAGNOSIS — I70221 Atherosclerosis of native arteries of extremities with rest pain, right leg: Secondary | ICD-10-CM | POA: Diagnosis not present

## 2019-01-05 DIAGNOSIS — R2689 Other abnormalities of gait and mobility: Secondary | ICD-10-CM | POA: Diagnosis not present

## 2019-01-05 DIAGNOSIS — F410 Panic disorder [episodic paroxysmal anxiety] without agoraphobia: Secondary | ICD-10-CM | POA: Diagnosis not present

## 2019-01-05 DIAGNOSIS — Z982 Presence of cerebrospinal fluid drainage device: Secondary | ICD-10-CM | POA: Diagnosis not present

## 2019-01-05 DIAGNOSIS — I252 Old myocardial infarction: Secondary | ICD-10-CM

## 2019-01-05 DIAGNOSIS — Z7982 Long term (current) use of aspirin: Secondary | ICD-10-CM

## 2019-01-05 DIAGNOSIS — R911 Solitary pulmonary nodule: Secondary | ICD-10-CM | POA: Diagnosis present

## 2019-01-05 DIAGNOSIS — T465X6A Underdosing of other antihypertensive drugs, initial encounter: Secondary | ICD-10-CM | POA: Diagnosis present

## 2019-01-05 DIAGNOSIS — I2510 Atherosclerotic heart disease of native coronary artery without angina pectoris: Secondary | ICD-10-CM | POA: Diagnosis present

## 2019-01-05 DIAGNOSIS — E785 Hyperlipidemia, unspecified: Secondary | ICD-10-CM | POA: Diagnosis not present

## 2019-01-05 DIAGNOSIS — Z95820 Peripheral vascular angioplasty status with implants and grafts: Secondary | ICD-10-CM | POA: Diagnosis not present

## 2019-01-05 DIAGNOSIS — I214 Non-ST elevation (NSTEMI) myocardial infarction: Secondary | ICD-10-CM | POA: Diagnosis not present

## 2019-01-05 DIAGNOSIS — I25119 Atherosclerotic heart disease of native coronary artery with unspecified angina pectoris: Secondary | ICD-10-CM | POA: Diagnosis not present

## 2019-01-05 HISTORY — DX: Peripheral vascular disease, unspecified: I73.9

## 2019-01-05 LAB — CBC WITH DIFFERENTIAL/PLATELET
Abs Immature Granulocytes: 0.02 10*3/uL (ref 0.00–0.07)
Basophils Absolute: 0.1 10*3/uL (ref 0.0–0.1)
Basophils Relative: 1 %
Eosinophils Absolute: 0.1 10*3/uL (ref 0.0–0.5)
Eosinophils Relative: 2 %
HCT: 47.1 % (ref 39.0–52.0)
Hemoglobin: 14.9 g/dL (ref 13.0–17.0)
Immature Granulocytes: 0 %
Lymphocytes Relative: 12 %
Lymphs Abs: 1 10*3/uL (ref 0.7–4.0)
MCH: 31.4 pg (ref 26.0–34.0)
MCHC: 31.6 g/dL (ref 30.0–36.0)
MCV: 99.4 fL (ref 80.0–100.0)
Monocytes Absolute: 0.7 10*3/uL (ref 0.1–1.0)
Monocytes Relative: 8 %
Neutro Abs: 6.3 10*3/uL (ref 1.7–7.7)
Neutrophils Relative %: 77 %
Platelets: 249 10*3/uL (ref 150–400)
RBC: 4.74 MIL/uL (ref 4.22–5.81)
RDW: 12.7 % (ref 11.5–15.5)
WBC: 8.2 10*3/uL (ref 4.0–10.5)
nRBC: 0 % (ref 0.0–0.2)

## 2019-01-05 LAB — BASIC METABOLIC PANEL
Anion gap: 6 (ref 5–15)
BUN: 8 mg/dL (ref 8–23)
CO2: 35 mmol/L — ABNORMAL HIGH (ref 22–32)
Calcium: 9.2 mg/dL (ref 8.9–10.3)
Chloride: 94 mmol/L — ABNORMAL LOW (ref 98–111)
Creatinine, Ser: 0.99 mg/dL (ref 0.61–1.24)
GFR calc Af Amer: >60 mL/min (ref >60)
GFR calc non Af Amer: >60 mL/min (ref >60)
Glucose, Bld: 84 mg/dL (ref 70–99)
Potassium: 4.3 mmol/L (ref 3.5–5.1)
Sodium: 135 mmol/L (ref 135–145)

## 2019-01-05 LAB — PROTIME-INR
INR: 1 (ref 0.8–1.2)
Prothrombin Time: 13.5 seconds (ref 11.4–15.2)

## 2019-01-05 MED ORDER — ACETAMINOPHEN 325 MG RE SUPP: 325.0000 mg | RECTAL | Status: DC | PRN

## 2019-01-05 MED ORDER — METOPROLOL TARTRATE 5 MG/5ML IV SOLN: 2.0000 mg | INTRAVENOUS | Status: DC | PRN

## 2019-01-05 MED ORDER — HEPARIN (PORCINE) 25000 UT/250ML-% IV SOLN
1050.0000 [IU]/h | INTRAVENOUS | Status: DC
  Administered 2019-01-05: 20:00:00 1300 [IU]/h via INTRAVENOUS
  Filled 2019-01-05 (×2): qty 250

## 2019-01-05 MED ORDER — MORPHINE SULFATE (PF) 2 MG/ML IV SOLN: 2.0000 mg | INTRAVENOUS | Status: DC | PRN

## 2019-01-05 MED ORDER — DOCUSATE SODIUM 100 MG PO CAPS
100.0000 mg | ORAL_CAPSULE | Freq: Two times a day (BID) | ORAL | Status: DC
  Administered 2019-01-05 – 2019-01-09 (×4): 100 mg via ORAL
  Filled 2019-01-05 (×6): qty 1

## 2019-01-05 MED ORDER — PANTOPRAZOLE SODIUM 40 MG PO TBEC
40.0000 mg | DELAYED_RELEASE_TABLET | Freq: Every day | ORAL | Status: DC
  Administered 2019-01-06 – 2019-01-09 (×3): 40 mg via ORAL
  Filled 2019-01-05 (×3): qty 1

## 2019-01-05 MED ORDER — HEPARIN BOLUS VIA INFUSION
5000.0000 [IU] | Freq: Once | INTRAVENOUS | Status: AC
  Administered 2019-01-05: 20:00:00 5000 [IU] via INTRAVENOUS
  Filled 2019-01-05: qty 5000

## 2019-01-05 MED ORDER — LABETALOL HCL 5 MG/ML IV SOLN
10.0000 mg | INTRAVENOUS | Status: AC | PRN
  Administered 2019-01-05 – 2019-01-06 (×4): 10 mg via INTRAVENOUS
  Filled 2019-01-05 (×4): qty 4

## 2019-01-05 MED ORDER — ALUM & MAG HYDROXIDE-SIMETH 200-200-20 MG/5ML PO SUSP: 15.0000 mL | ORAL | Status: DC | PRN

## 2019-01-05 MED ORDER — ONDANSETRON HCL 4 MG/2ML IJ SOLN
4.0000 mg | Freq: Four times a day (QID) | INTRAMUSCULAR | Status: DC | PRN
  Administered 2019-01-08: 21:00:00 4 mg via INTRAVENOUS
  Filled 2019-01-05: qty 2

## 2019-01-05 MED ORDER — GUAIFENESIN-DM 100-10 MG/5ML PO SYRP: 15.0000 mL | ORAL_SOLUTION | ORAL | Status: DC | PRN

## 2019-01-05 MED ORDER — HYDRALAZINE HCL 20 MG/ML IJ SOLN
5.0000 mg | INTRAMUSCULAR | Status: AC | PRN
  Administered 2019-01-06 (×2): 5 mg via INTRAVENOUS
  Filled 2019-01-05 (×2): qty 1

## 2019-01-05 MED ORDER — PHENOL 1.4 % MT LIQD
1.0000 | OROMUCOSAL | Status: DC | PRN
  Filled 2019-01-05: qty 177

## 2019-01-05 MED ORDER — OXYCODONE HCL 5 MG PO TABS
5.0000 mg | ORAL_TABLET | ORAL | Status: DC | PRN
  Administered 2019-01-07 – 2019-01-09 (×7): 10 mg via ORAL
  Filled 2019-01-05 (×7): qty 2

## 2019-01-05 MED ORDER — DEXTROSE-NACL 5-0.45 % IV SOLN
INTRAVENOUS | Status: DC
  Administered 2019-01-05 – 2019-01-06 (×2): via INTRAVENOUS

## 2019-01-05 MED ORDER — POTASSIUM CHLORIDE CRYS ER 20 MEQ PO TBCR: 20.0000 meq | EXTENDED_RELEASE_TABLET | Freq: Once | ORAL | Status: DC

## 2019-01-05 MED ORDER — ACETAMINOPHEN 325 MG PO TABS
325.0000 mg | ORAL_TABLET | ORAL | Status: DC | PRN
  Administered 2019-01-08 – 2019-01-09 (×2): 650 mg via ORAL
  Filled 2019-01-05 (×2): qty 2

## 2019-01-05 NOTE — Progress Notes (Signed)
ABI       has been completed. Preliminary results can be found under CV proc through chart review. Josias Tomerlin, BS, RDMS, RVT   

## 2019-01-05 NOTE — ED Notes (Signed)
repaged vascular tech spoke with Sharee Pimple

## 2019-01-05 NOTE — Progress Notes (Signed)
ANTICOAGULATION CONSULT NOTE - Initial Consult  Pharmacy Consult for heparin Indication: ischemic leg  No Known Allergies  Patient Measurements: Height: 6' (182.9 cm) Weight: 184 lb (83.5 kg) IBW/kg (Calculated) : 77.6 Heparin Dosing Weight: 83.5kg  Vital Signs: Temp: 98.4 F (36.9 C) (04/20 1356) Temp Source: Oral (04/20 1159) BP: 177/94 (04/20 1900) Pulse Rate: 72 (04/20 1900)  Labs: Recent Labs    01/05/19 1502  HGB 14.9  HCT 47.1  PLT 249  LABPROT 13.5  INR 1.0  CREATININE 0.99    Estimated Creatinine Clearance: 80.6 mL/min (by C-G formula based on SCr of 0.99 mg/dL).   Medical History: Past Medical History:  Diagnosis Date  . Acute exacerbation of chronic obstructive pulmonary disease (COPD) ([place])    [name] 03/23/2015  . Alcohol abuse   . CAD (coronary artery disease)    a. s/p multi-link vision stent to the mid RCA in 2007 at [facility].  . Hyperlipidemia   . Hypertension   . Myocardial infarction [facility]) 2008   [name] 03/23/2015  . Panic attack   . Tobacco abuse     Medications:  Infusions:  . heparin      Assessment: 28 yom presented to the ED with leg pain. To start IV heparin for possible ischemic leg. Baseline CBC is WNL. He is not on anticoagulation PTA. His apixaban was recently discontinued.   Goal of Therapy:  Heparin level 0.3-0.7 units/ml Monitor platelets by anticoagulation protocol: Yes   Plan:  Heparin bolus 5000 units IV x 1 Heparin gtt 1300 units/hr Check a 6 hr heparin level Daily heparin level and CBC  Kieryn Burtis, Rande Lawman 01/05/2019,7:48 PM

## 2019-01-05 NOTE — ED Triage Notes (Signed)
Pt arrives to ED after being seen at urgent care just PTA for further evaluation of his known leg claudication- pt was recently admitted to th hospital for CP/HTN but pt states this "leg issue was not addressed". Since being home pain and been worse in left foot with increase in swelling.

## 2019-01-05 NOTE — ED Provider Notes (Addendum)
[facility] EMERGENCY DEPARTMENT Provider Note   CSN: [id number] Arrival date & time: 01/05/19  1351    History   Chief Complaint Chief Complaint  Patient presents with  . Claudication    HPI Omar Singh is a 68 y.o. male.     Patient is a 68 year old male with past medical history of coronary artery disease, hypertension, hyperlipidemia, and recent admission for paroxysmal atrial fibrillation.  Patient states that he has had pain in his leg with ambulation for "quite some time".  This is become worse over the past several weeks.  Patient was seen at urgent care, then sent here for further evaluation as his toes and distal foot are now noted to have a reddish discoloration.  Patient reports decreased sensation.  The history is provided by the patient.    Past Medical History:  Diagnosis Date  . Acute exacerbation of chronic obstructive pulmonary disease (COPD) ([place])    [name] 03/23/2015  . Alcohol abuse   . CAD (coronary artery disease)    a. s/p multi-link vision stent to the mid RCA in 2007 at [facility].  . Hyperlipidemia   . Hypertension   . Myocardial infarction [facility]) 2008   [name] 03/23/2015  . Panic attack   . Tobacco abuse     Patient Active Problem List   Diagnosis Date Noted  . PAD (peripheral artery disease) ([place])   . Coronary artery disease involving coronary bypass graft of native heart with angina pectoris ([place])   . Hypertensive urgency   . Nonadherence to medication   . Intentional underdosing of agent primarily affecting cardiovascular system by patient due to financial hardship   . Chest pain 12/05/2018  . PAF (paroxysmal atrial fibrillation) ([place]) 03/26/2015  . NSTEMI (non-ST elevated myocardial infarction) ([place])   . Hypoxia 03/23/2015  . Shortness of breath 03/23/2015  . CAD S/P RCA BMS '08, new RCA BMS 03/25/15 03/23/2015  . Elevated troponin 03/23/2015  . Pulmonary nodule 03/23/2015  . Alcohol abuse 03/23/2015  . Tobacco  abuse 03/23/2015  . Acute respiratory failure with hypoxemia ([place]) 03/23/2015  . COPD exacerbation ([place]) 03/23/2015    Past Surgical History:  Procedure Laterality Date  . CARDIAC CATHETERIZATION N/A 03/25/2015   Procedure: Left Heart Cath and Coronary Angiography;  Surgeon: Burnell Blanks, MD;  Location: [facility] CV LAB;  Service: Cardiovascular;  Laterality: N/A;  . CARDIAC CATHETERIZATION N/A 03/25/2015   Procedure: Coronary Stent Intervention;  Surgeon: Burnell Blanks, MD;  Location: [facility] CV LAB;  Service: Cardiovascular;  Laterality: N/A;  . CORONARY ANGIOPLASTY WITH STENT PLACEMENT  2008   "1"  . TONSILLECTOMY          Home Medications    Prior to Admission medications   Medication Sig Start Date End Date Taking? Authorizing Provider  albuterol (PROVENTIL HFA;VENTOLIN HFA) 108 (90 BASE) MCG/ACT inhaler Inhale 2 puffs into the lungs every 6 (six) hours as needed for wheezing or shortness of breath. Patient not taking: Reported on 12/05/2018 03/27/15   Nita Sells, MD  aspirin EC 81 MG EC tablet Take 1 tablet (81 mg total) by mouth daily. 12/07/18   Nuala Alpha, DO  atorvastatin (LIPITOR) 40 MG tablet Take 1 tablet (40 mg total) by mouth daily at 6 PM. 12/07/18   Lockamy, Timothy, DO  losartan (COZAAR) 50 MG tablet Take 1 tablet (50 mg total) by mouth daily. 12/07/18   Nuala Alpha, DO  metoprolol tartrate (LOPRESSOR) 25 MG tablet Take 1  tablet (25 mg total) by mouth 2 (two) times daily for 30 days. 12/07/18 01/06/19  Nuala Alpha, DO  mometasone-formoterol (DULERA) 200-5 MCG/ACT AERO Inhale 2 puffs into the lungs daily. Patient not taking: Reported on 12/05/2018 03/27/15   Nita Sells, MD  tiotropium (SPIRIVA HANDIHALER) 18 MCG inhalation capsule Place 1 capsule (18 mcg total) into inhaler and inhale 2 (two) times daily. Patient not taking: Reported on 12/05/2018 03/27/15   Nita Sells, MD    Family History Family History   Problem Relation Age of Onset  . CVA Other        Grandparents had strokes    Social History Social History   Tobacco Use  . Smoking status: Current Every Day Smoker    Packs/day: 0.25    Years: 47.00    Pack years: 11.75    Types: Cigarettes    Last attempt to quit: 03/24/2015    Years since quitting: 3.7  . Smokeless tobacco: Never Used  Substance Use Topics  . Alcohol use: Not Currently    Alcohol/week: 56.0 standard drinks    Types: 56 Cans of beer per week    Comment: quit 1 year ago   . Drug use: No     Allergies   Patient has no known allergies.   Review of Systems Review of Systems  All other systems reviewed and are negative.    Physical Exam Updated Vital Signs BP (!) 209/98 (BP Location: Right Arm)   Pulse 78   Temp 98.4 F (36.9 C)   Resp 18   Ht 6' (1.829 m)   Wt 83.5 kg   SpO2 92%   BMI 24.95 kg/m   Physical Exam Vitals signs and nursing note reviewed.  Constitutional:      General: He is not in acute distress.    Appearance: He is well-developed. He is not diaphoretic.  HENT:     Head: Normocephalic and atraumatic.  Neck:     Musculoskeletal: Normal range of motion and neck supple.  Cardiovascular:     Rate and Rhythm: Normal rate and regular rhythm.     Heart sounds: No murmur. No friction rub.  Pulmonary:     Effort: Pulmonary effort is normal. No respiratory distress.     Breath sounds: Normal breath sounds. No wheezing or rales.  Abdominal:     General: Bowel sounds are normal. There is no distension.     Palpations: Abdomen is soft.     Tenderness: There is no abdominal tenderness.  Musculoskeletal: Normal range of motion.        General: Tenderness present.     Comments: There is reddish discoloration to the distal right foot and toes with diminished capillary refill.  The right foot is somewhat cool to the touch when compared with the left.  I am unable to palpate pulses and are not obtained with Doppler either.  Skin:     General: Skin is warm and dry.  Neurological:     Mental Status: He is alert and oriented to person, place, and time.     Coordination: Coordination normal.      ED Treatments / Results  Labs (all labs ordered are listed, but only abnormal results are displayed) Labs Reviewed  BASIC METABOLIC PANEL  PROTIME-INR  CBC WITH DIFFERENTIAL/PLATELET    EKG None  Radiology No results found.  Procedures Procedures (including critical care time)  Medications Ordered in ED Medications - No data to display   Initial Impression / Assessment  and Plan / ED Course  I have reviewed the triage vital signs and the nursing notes.  Pertinent labs & imaging results that were available during my care of the patient were reviewed by me and considered in my medical decision making (see chart for details).  Patient presenting with complaints of numbness and pain to his right foot.  He has increased redness of the distal foot and toes.  I am unable to palpate DP pulses and could not identify them with the Doppler.  Vascular study shows no flow in the right foot.  This was discussed with Dr. Oneida Alar from vascular surgery who will evaluate and admit.  Final Clinical Impressions(s) / ED Diagnoses   Final diagnoses:  None    ED Discharge Orders    None       Veryl Speak, MD 01/05/19 Alda Berthold    Veryl Speak, MD 01/06/19 [phone]

## 2019-01-05 NOTE — ED Triage Notes (Signed)
Pt cc states she was in the hospital a month ago. Pt states he told the doctor there that he has swelling in both feet . Pt states its getting worst. Pt says now he has numbness and swelling in both feet and legs and It's painful.

## 2019-01-05 NOTE — Discharge Instructions (Addendum)
Go to the ER for further evaluation and management. °

## 2019-01-05 NOTE — ED Provider Notes (Signed)
[facility]    CSN: [id number] Arrival date & time: 01/05/19  1142     History   Chief Complaint Chief Complaint  Patient presents with  . Leg Swelling    HPI Omar Singh is a 68 y.o. male.   Patient is a 68 year old male with past medical history of COPD, alcohol abuse, CAD, PAD, hyperlipidemia, hypertension, MI, tobacco abuse.  He presents with approximately 2 to 3 days of worsening bilateral lower extremity swelling, pain.  He is also had some associated numbness and color change.  Symptoms are worse on the right lower extremity with increased pain and redness.  Pain is there with rest.  Patient has a known leg claudication.  He was recently admitted to the hospital for hypertensive emergency.  Reports he was having minor symptoms then but much more severe now with pain, swelling and change in color.  Denies any chest pain, SOB. No hx of diabetes.   ROS per HPI      Past Medical History:  Diagnosis Date  . Acute exacerbation of chronic obstructive pulmonary disease (COPD) ([place])    [name] 03/23/2015  . Alcohol abuse   . CAD (coronary artery disease)    a. s/p multi-link vision stent to the mid RCA in 2007 at [facility].  . Hyperlipidemia   . Hypertension   . Myocardial infarction [facility]) 2008   [name] 03/23/2015  . Panic attack   . Tobacco abuse     Patient Active Problem List   Diagnosis Date Noted  . PAD (peripheral artery disease) ([place])   . Coronary artery disease involving coronary bypass graft of native heart with angina pectoris ([place])   . Hypertensive urgency   . Nonadherence to medication   . Intentional underdosing of agent primarily affecting cardiovascular system by patient due to financial hardship   . Chest pain 12/05/2018  . PAF (paroxysmal atrial fibrillation) ([place]) 03/26/2015  . NSTEMI (non-ST elevated myocardial infarction) ([place])   . Hypoxia 03/23/2015  . Shortness of breath 03/23/2015  . CAD S/P RCA BMS '08, new RCA BMS 03/25/15  03/23/2015  . Elevated troponin 03/23/2015  . Pulmonary nodule 03/23/2015  . Alcohol abuse 03/23/2015  . Tobacco abuse 03/23/2015  . Acute respiratory failure with hypoxemia ([place]) 03/23/2015  . COPD exacerbation ([place]) 03/23/2015    Past Surgical History:  Procedure Laterality Date  . CARDIAC CATHETERIZATION N/A 03/25/2015   Procedure: Left Heart Cath and Coronary Angiography;  Surgeon: Burnell Blanks, MD;  Location: [facility] CV LAB;  Service: Cardiovascular;  Laterality: N/A;  . CARDIAC CATHETERIZATION N/A 03/25/2015   Procedure: Coronary Stent Intervention;  Surgeon: Burnell Blanks, MD;  Location: [facility] CV LAB;  Service: Cardiovascular;  Laterality: N/A;  . CORONARY ANGIOPLASTY WITH STENT PLACEMENT  2008   "1"  . TONSILLECTOMY         Home Medications    Prior to Admission medications   Medication Sig Start Date End Date Taking? Authorizing Provider  albuterol (PROVENTIL HFA;VENTOLIN HFA) 108 (90 BASE) MCG/ACT inhaler Inhale 2 puffs into the lungs every 6 (six) hours as needed for wheezing or shortness of breath. Patient not taking: Reported on 12/05/2018 03/27/15   Nita Sells, MD  aspirin EC 81 MG EC tablet Take 1 tablet (81 mg total) by mouth daily. 12/07/18   Nuala Alpha, DO  atorvastatin (LIPITOR) 40 MG tablet Take 1 tablet (40 mg total) by mouth daily at 6 PM. 12/07/18   Lockamy, Timothy, DO  losartan (  COZAAR) 50 MG tablet Take 1 tablet (50 mg total) by mouth daily. 12/07/18   Nuala Alpha, DO  metoprolol tartrate (LOPRESSOR) 25 MG tablet Take 1 tablet (25 mg total) by mouth 2 (two) times daily for 30 days. 12/07/18 01/06/19  Nuala Alpha, DO  mometasone-formoterol (DULERA) 200-5 MCG/ACT AERO Inhale 2 puffs into the lungs daily. Patient not taking: Reported on 12/05/2018 03/27/15   Nita Sells, MD  tiotropium (SPIRIVA HANDIHALER) 18 MCG inhalation capsule Place 1 capsule (18 mcg total) into inhaler and inhale 2 (two) times daily.  Patient not taking: Reported on 12/05/2018 03/27/15   Nita Sells, MD    Family History Family History  Problem Relation Age of Onset  . CVA Other        Grandparents had strokes    Social History Social History   Tobacco Use  . Smoking status: Former Smoker    Packs/day: 1.00    Years: 47.00    Pack years: 47.00    Types: Cigarettes    Last attempt to quit: 03/24/2015    Years since quitting: 3.7  . Smokeless tobacco: Never Used  Substance Use Topics  . Alcohol use: Yes    Alcohol/week: 56.0 standard drinks    Types: 56 Cans of beer per week    Comment: 6-8 beers nightly  . Drug use: No     Allergies   Patient has no known allergies.   Review of Systems Review of Systems   Physical Exam Triage Vital Signs ED Triage Vitals  Enc Vitals Group     BP 01/05/19 1159 (!) 188/111     Pulse Rate 01/05/19 1159 84     Resp 01/05/19 1159 18     Temp 01/05/19 1159 98.2 F (36.8 C)     Temp Source 01/05/19 1159 Oral     SpO2 01/05/19 1159 96 %     Weight 01/05/19 1200 184 lb (83.5 kg)     Height --      Head Circumference --      Peak Flow --      Pain Score 01/05/19 1200 8     Pain Loc --      Pain Edu? --      Excl. in [place]? --    No data found.  Updated Vital Signs BP (!) 188/111 (BP Location: Left Arm)   Pulse 84   Temp 98.2 F (36.8 C) (Oral)   Resp 18   Wt 184 lb (83.5 kg)   SpO2 96%   BMI 24.95 kg/m   Visual Acuity Right Eye Distance:   Left Eye Distance:   Bilateral Distance:    Right Eye Near:   Left Eye Near:    Bilateral Near:     Physical Exam Vitals signs and nursing note reviewed.  Constitutional:      Appearance: Normal appearance.  HENT:     Head: Normocephalic and atraumatic.     Nose: Nose normal.     Mouth/Throat:     Pharynx: Oropharynx is clear.  Eyes:     Conjunctiva/sclera: Conjunctivae normal.  Neck:     Musculoskeletal: Normal range of motion.  Cardiovascular:     Rate and Rhythm: Normal rate and regular  rhythm.  Pulmonary:     Effort: Pulmonary effort is normal.     Breath sounds: Normal breath sounds.  Musculoskeletal:        General: Swelling and tenderness present.     Right lower leg: Edema present.  Left lower leg: Edema present.     Comments: Right foot Unable to palpate a right pedal pulse.  Bruit heard with Doppler.  1+ edema and erythema to right foot and toes.  Slight decrease in temperature.  Left foot- 1 + edema, no redness, normal color and slightly less edematous than the right foot. Temperature normal.   Skin:    General: Skin is warm and dry.     Findings: Erythema present.  Neurological:     Mental Status: He is alert.  Psychiatric:        Mood and Affect: Mood normal.      UC Treatments / Results  Labs (all labs ordered are listed, but only abnormal results are displayed) Labs Reviewed - No data to display  EKG None  Radiology No results found.  Procedures Procedures (including critical care time)  Medications Ordered in UC Medications - No data to display  Initial Impression / Assessment and Plan / UC Course  I have reviewed the triage vital signs and the nursing notes.  Pertinent labs & imaging results that were available during my care of the patient were reviewed by me and considered in my medical decision making (see chart for details).    PAD  Pt with hx of leg claudication here with increasing pain, swelling and color change to the RLE According to the discharge note pt was suppose to have ABIs done outpatient after being discharged from the hospital a month ago.  Bruit heard with doppler of pedal pulse to right foot and redness to foot and toes.  Able to palpate pulse on the left foot but weak. Normal color.  Pt needs further testing and vascular consult Sending to the ER for further evaluation and management.  Pt understanding and agreed.  Final Clinical Impressions(s) / UC Diagnoses   Final diagnoses:  PAD (peripheral artery  disease) ([place])  Bilateral lower extremity edema     Discharge Instructions     Go to the ER for further evaluation and management    ED Prescriptions    None     Controlled Substance Prescriptions [place] Controlled Substance Registry consulted? Not Applicable   Orvan July, NP 01/05/19 1258

## 2019-01-05 NOTE — ED Notes (Signed)
ED TO INPATIENT HANDOFF REPORT  ED Nurse Name and Phone #: Judson Roch [phone]  S Name/Age/Gender Omar Singh 68 y.o. male Room/Bed: 014C/014C  Code Status   Code Status: Full Code  Home/SNF/Other Home Patient oriented to: self, place, time and situation Is this baseline? Yes   Triage Complete: Triage complete  Chief Complaint sent by uc/poss blockage in leg  Triage Note Pt arrives to ED after being seen at urgent care just PTA for further evaluation of his known leg claudication- pt was recently admitted to th hospital for CP/HTN but pt states this "leg issue was not addressed". Since being home pain and been worse in left foot with increase in swelling.    Allergies No Known Allergies  Level of Care/Admitting Diagnosis ED Disposition    ED Disposition Condition [facility] Area: [place] [100100]  Level of Care: Telemetry Surgical [105]  Covid Evaluation: N/A  Diagnosis: PAD (peripheral artery disease) [facility]) [[id number]]  Admitting Physician: Darlin Drop  Attending Physician: Elam Dutch [phone]  Estimated length of stay: 5 - 7 days  Certification:: I certify this patient will need inpatient services for at least 2 midnights  Bed request comments: 4e  PT Class (Do Not Modify): Inpatient [101]  PT Acc Code (Do Not Modify): Private [1]       B Medical/Surgery History Past Medical History:  Diagnosis Date  . Acute exacerbation of chronic obstructive pulmonary disease (COPD) ([place])    [name] 03/23/2015  . Alcohol abuse   . CAD (coronary artery disease)    a. s/p multi-link vision stent to the mid RCA in 2007 at [facility].  . Hyperlipidemia   . Hypertension   . Myocardial infarction [facility]) 2008   [name] 03/23/2015  . Panic attack   . Tobacco abuse    Past Surgical History:  Procedure Laterality Date  . CARDIAC CATHETERIZATION N/A 03/25/2015   Procedure: Left Heart Cath and Coronary Angiography;  Surgeon: Burnell Blanks, MD;  Location: [place] [facility];  Service: Cardiovascular;  Laterality: N/A;  . CARDIAC CATHETERIZATION N/A 03/25/2015   Procedure: Coronary Stent Intervention;  Surgeon: Burnell Blanks, MD;  Location: [place] [facility];  Service: Cardiovascular;  Laterality: N/A;  . CORONARY ANGIOPLASTY WITH STENT PLACEMENT  2008   "1"  . TONSILLECTOMY       A IV Location/Drains/Wounds Patient Lines/Drains/Airways Status   Active Line/Drains/Airways    Name:   Placement date:   Placement time:   Site:   Days:   Peripheral IV 01/05/19 Right Antecubital   01/05/19    1503    Antecubital   less than 1          Intake/Output Last 24 hours No intake or output data in the 24 hours ending 01/05/19 2226  Labs/Imaging Results for orders placed or performed during the hospital encounter of 01/05/19 (from the past 48 hour(s))  Basic metabolic panel     Status: Abnormal   Collection Time: 01/05/19  3:02 PM  Result Value Ref Range   Sodium 135 135 - 145 mmol/L   Potassium 4.3 3.5 - 5.1 mmol/L   Chloride 94 (L) 98 - 111 mmol/L   CO2 35 (H) 22 - 32 mmol/L   Glucose, Bld 84 70 - 99 mg/dL   BUN 8 8 - 23 mg/dL   Creatinine, Ser 0.99 0.61 - 1.24 mg/dL   Calcium 9.2 8.9 - 10.3 mg/dL   GFR  calc non Af Amer >60 >60 mL/min   GFR calc Af Amer >60 >60 mL/min   Anion gap 6 5 - 15    Comment: Performed at [facility] [address], [place], [place] [address]  Protime-INR     Status: None   Collection Time: 01/05/19  3:02 PM  Result Value Ref Range   Prothrombin Time 13.5 11.4 - 15.2 seconds   INR 1.0 0.8 - 1.2    Comment: (NOTE) INR goal varies based on device and disease states. Performed at [facility], [place] [address] [place], [place] [address]   CBC with Differential     Status: None   Collection Time: 01/05/19  3:02 PM  Result Value Ref Range   WBC 8.2 4.0 - 10.5 K/uL   RBC 4.74 4.22 - 5.81 MIL/uL   Hemoglobin 14.9 13.0 - 17.0 g/dL   HCT 47.1 39.0 - 52.0 %   MCV  99.4 80.0 - 100.0 fL   MCH 31.4 26.0 - 34.0 pg   MCHC 31.6 30.0 - 36.0 g/dL   RDW 12.7 11.5 - 15.5 %   Platelets 249 150 - 400 K/uL   nRBC 0.0 0.0 - 0.2 %   Neutrophils Relative % 77 %   Neutro Abs 6.3 1.7 - 7.7 K/uL   Lymphocytes Relative 12 %   Lymphs Abs 1.0 0.7 - 4.0 K/uL   Monocytes Relative 8 %   Monocytes Absolute 0.7 0.1 - 1.0 K/uL   Eosinophils Relative 2 %   Eosinophils Absolute 0.1 0.0 - 0.5 K/uL   Basophils Relative 1 %   Basophils Absolute 0.1 0.0 - 0.1 K/uL   Immature Granulocytes 0 %   Abs Immature Granulocytes 0.02 0.00 - 0.07 K/uL    Comment: Performed at [facility], [address] [place], [place] [address]   Vas [place] Abi With/wo Tbi  Result Date: 01/05/2019 LOWER EXTREMITY DOPPLER STUDY Indications: Rest pain, and peripheral artery disease.  Performing Technologist: June Leap RDMS, RVT  Examination Guidelines: A complete evaluation includes at minimum, Doppler waveform signals and systolic blood pressure reading at the level of bilateral brachial, anterior tibial, and posterior tibial arteries, when vessel segments are accessible. Bilateral testing is considered an integral part of a complete examination. Photoelectric Plethysmograph (PPG) waveforms and toe systolic pressure readings are included as required and additional duplex testing as needed. Limited examinations for reoccurring indications may be performed as noted.  ABI Findings: +---------+------------------+-----+--------+--------+ Right    Rt Pressure (mmHg)IndexWaveformComment  +---------+------------------+-----+--------+--------+ Brachial 177                                     +---------+------------------+-----+--------+--------+ ATA                             absent           +---------+------------------+-----+--------+--------+ PTA                             absent           +---------+------------------+-----+--------+--------+ DP                              absent            +---------+------------------+-----+--------+--------+ [place]  Toe                       absent           +---------+------------------+-----+--------+--------+ +----+------------------+-----+----------+-------+ LeftLt Pressure (mmHg)IndexWaveform  Comment +----+------------------+-----+----------+-------+ ATA 73                0.41 monophasic        +----+------------------+-----+----------+-------+ PTA 120               0.68 monophasic        +----+------------------+-----+----------+-------+  Summary: Right: Absent waveforms of right pedal arteries. Absent PPG waveform of great toe. Left: Resting left ankle-brachial index indicates moderate left lower extremity arterial disease.  *See table(s) above for measurements and observations.    Preliminary     Pending Labs Unresulted Labs (From admission, onward)    Start     Ordered   01/07/19 0500  Heparin level (unfractionated)  Daily,   R     01/05/19 1947   01/06/19 0500  CBC  Daily,   R     01/05/19 1947   01/06/19 0500  Comprehensive metabolic panel  Tomorrow morning,   R     01/05/19 2217   01/06/19 0230  Heparin level (unfractionated)  Once-Timed,   R     01/05/19 1947   01/05/19 2216  Type and screen [place]  Once,   R    Comments:  [place]    01/05/19 2217   01/05/19 2214  Urinalysis, Routine w reflex microscopic  Once,   R     01/05/19 2217          Vitals/Pain Today's Vitals   01/05/19 1800 01/05/19 1830 01/05/19 1900 01/05/19 2020  BP: (!) 185/87 (!) 195/97 (!) 177/94   Pulse: 70 68 72   Resp:      Temp:      SpO2: 96% 94% 95%   Weight:      Height:      PainSc:    0-No pain    Isolation Precautions No active isolations  Medications Medications  heparin ADULT infusion 100 units/mL (25000 units/211mL sodium chloride 0.45%) (1,300 Units/hr Intravenous New Bag/Given 01/05/19 2013)  potassium chloride SA (K-DUR) CR tablet 20-40 mEq (has no  administration in time range)  ondansetron (ZOFRAN) injection 4 mg (has no administration in time range)  alum & mag hydroxide-simeth (MAALOX/MYLANTA) 200-200-20 MG/5ML suspension 15-30 mL (has no administration in time range)  pantoprazole (PROTONIX) EC tablet 40 mg (has no administration in time range)  labetalol (NORMODYNE) injection 10 mg (has no administration in time range)  hydrALAZINE (APRESOLINE) injection 5 mg (has no administration in time range)  metoprolol tartrate (LOPRESSOR) injection 2-5 mg (has no administration in time range)  guaiFENesin-dextromethorphan (ROBITUSSIN DM) 100-10 MG/5ML syrup 15 mL (has no administration in time range)  phenol (CHLORASEPTIC) mouth spray 1 spray (has no administration in time range)  dextrose 5 %-0.45 % sodium chloride infusion (has no administration in time range)  acetaminophen (TYLENOL) tablet 325-650 mg (has no administration in time range)    Or  acetaminophen (TYLENOL) suppository 325-650 mg (has no administration in time range)  oxyCODONE (Oxy IR/ROXICODONE) immediate release tablet 5-10 mg (has no administration in time range)  morphine 2 MG/ML injection 2-5 mg (has no administration in time range)  docusate sodium (COLACE) capsule 100 mg (has no administration in time range)  heparin bolus via infusion 5,000 Units (5,000 Units Intravenous  Bolus from Bag 01/05/19 2012)    Mobility walks Low fall risk   Focused Assessments Cardiac Assessment Handoff:    Lab Results  Component Value Date   TROPONINI <0.03 12/06/2018   No results found for: DDIMER Does the Patient currently have chest pain? No     R Recommendations: See Admitting Provider Note  Report given to:   Additional Notes:

## 2019-01-05 NOTE — ED Notes (Signed)
Patient verbalizes understanding of discharge instructions. Opportunity for questioning and answers were provided. Patient discharged from [facility] by provider for continuation of care in the Emergency Department.

## 2019-01-05 NOTE — H&P (Signed)
Referring Physician: [place]  Patient name: Omar Singh MRN: [id number] DOB: Dec 01, 1950 Sex: male  REASON FOR CONSULT: right foot rest pain  HPI: Omar Singh is a 68 y.o. male, with a 6 month history of only being able to walk 120 ft prior to experiencing bilateral calf claudication.  Over the last month his walking distance in the right leg has decreased to about 10 ft.  He also has pain in the right foot.  He smokes 2 Pack per week and is trying to quit.  He has no wounds.  He denies Afib or cardiac arrhythmia.  He denies chest pain.  He has no history of stroke. Other medical problems include COPD, CAD, hyperlipid, hypertension all stable except the hypertension.  BP elevated in ER.  He used to drink heavily in the past but quit.  Past Medical History:  Diagnosis Date  . Acute exacerbation of chronic obstructive pulmonary disease (COPD) ([place])    [name] 03/23/2015  . Alcohol abuse   . CAD (coronary artery disease)    a. s/p multi-link vision stent to the mid RCA in 2007 at [facility].  . Hyperlipidemia   . Hypertension   . Myocardial infarction [facility]) 2008   [name] 03/23/2015  . Panic attack   . Tobacco abuse    Past Surgical History:  Procedure Laterality Date  . CARDIAC CATHETERIZATION N/A 03/25/2015   Procedure: Left Heart Cath and Coronary Angiography;  Surgeon: Burnell Blanks, MD;  Location: [place] [facility];  Service: Cardiovascular;  Laterality: N/A;  . CARDIAC CATHETERIZATION N/A 03/25/2015   Procedure: Coronary Stent Intervention;  Surgeon: Burnell Blanks, MD;  Location: [place] [facility];  Service: Cardiovascular;  Laterality: N/A;  . CORONARY ANGIOPLASTY WITH STENT PLACEMENT  2008   "1"  . TONSILLECTOMY      Family History  Problem Relation Age of Onset  . CVA Other        Grandparents had strokes    SOCIAL HISTORY: Social History   Socioeconomic History  . Marital status: Married    Spouse name: Not on file  . Number of children: Not  on file  . Years of education: Not on file  . Highest education level: Not on file  Occupational History  . Not on file  Social Needs  . Financial resource strain: Not on file  . Food insecurity:    Worry: Not on file    Inability: Not on file  . Transportation needs:    Medical: Not on file    Non-medical: Not on file  Tobacco Use  . Smoking status: Current Every Day Smoker    Packs/day: 0.25    Years: 47.00    Pack years: 11.75    Types: Cigarettes    Last attempt to quit: 03/24/2015    Years since quitting: 3.7  . Smokeless tobacco: Never Used  Substance and Sexual Activity  . Alcohol use: Not Currently    Alcohol/week: 56.0 standard drinks    Types: 56 Cans of beer per week    Comment: quit 1 year ago   . Drug use: No  . Sexual activity: Not Currently  Lifestyle  . Physical activity:    Days per week: Not on file    Minutes per session: Not on file  . Stress: Not on file  Relationships  . Social connections:    Talks on phone: Not on file    Gets together: Not on file  Attends religious service: Not on file    Active member of club or organization: Not on file    Attends meetings of clubs or organizations: Not on file    Relationship status: Not on file  . Intimate partner violence:    Fear of current or ex partner: Not on file    Emotionally abused: Not on file    Physically abused: Not on file    Forced sexual activity: Not on file  Other Topics Concern  . Not on file  Social History Narrative  . Not on file    No Known Allergies  Current Facility-Administered Medications  Medication Dose Route Frequency Provider Last Rate Last Dose  . heparin ADULT infusion 100 units/mL (25000 units/212mL sodium chloride 0.45%)  1,300 Units/hr Intravenous Continuous Rumbarger, Valeda Malm, RPH 13 mL/hr at 01/05/19 2013 1,300 Units/hr at 01/05/19 2013   Current Outpatient Medications  Medication Sig Dispense Refill  . albuterol (PROVENTIL HFA;VENTOLIN HFA) 108 (90 BASE)  MCG/ACT inhaler Inhale 2 puffs into the lungs every 6 (six) hours as needed for wheezing or shortness of breath. (Patient not taking: Reported on 12/05/2018) 1 Inhaler 2  . aspirin EC 81 MG EC tablet Take 1 tablet (81 mg total) by mouth daily. 30 tablet 0  . atorvastatin (LIPITOR) 40 MG tablet Take 1 tablet (40 mg total) by mouth daily at 6 PM. 30 tablet 0  . losartan (COZAAR) 50 MG tablet Take 1 tablet (50 mg total) by mouth daily. 30 tablet 0  . metoprolol tartrate (LOPRESSOR) 25 MG tablet Take 1 tablet (25 mg total) by mouth 2 (two) times daily for 30 days. 60 tablet 0  . mometasone-formoterol (DULERA) 200-5 MCG/ACT AERO Inhale 2 puffs into the lungs daily. (Patient not taking: Reported on 12/05/2018) 1 Inhaler 0  . tiotropium (SPIRIVA HANDIHALER) 18 MCG inhalation capsule Place 1 capsule (18 mcg total) into inhaler and inhale 2 (two) times daily. (Patient not taking: Reported on 12/05/2018) 30 capsule 0    ROS:   General:  + weight loss, no Fever, no chills  HEENT: No recent headaches, no nasal bleeding, no visual changes, no sore throat  Neurologic: No dizziness, blackouts, seizures. No recent symptoms of stroke or mini- stroke. No recent episodes of slurred speech, or temporary blindness.  Cardiac: No recent episodes of chest pain/pressure, no shortness of breath at rest.  + shortness of breath with exertion.  Denies history of atrial fibrillation or irregular heartbeat  Vascular: + history of rest pain in feet.  + history of claudication.  No history of non-healing ulcer, No history of DVT   Pulmonary: No home oxygen, no productive cough, no hemoptysis,  + asthma or wheezing  Musculoskeletal:  [ ]  Arthritis, [ ]  Low back pain,  [ ]  Joint pain  Hematologic:No history of hypercoagulable state.  No history of easy bleeding.  No history of anemia  Gastrointestinal: No hematochezia or melena,  No gastroesophageal reflux, no trouble swallowing  Urinary: [ ]  chronic Kidney disease, [ ]  on  HD - [ ]  MWF or [ ]  TTHS, [ ]  Burning with urination, [ ]  Frequent urination, [ ]  Difficulty urinating;   Skin: No rashes  Psychological: No history of anxiety,  No history of depression   Physical Examination  Vitals:   01/05/19 1756 01/05/19 1800 01/05/19 1830 01/05/19 1900  BP: (!) 172/103 (!) 185/87 (!) 195/97 (!) 177/94  Pulse: 70 70 68 72  Resp: 16     Temp:  SpO2: 96% 96% 94% 95%  Weight:      Height:        Body mass index is 24.95 kg/m.  General:  Alert and oriented, no acute distress HEENT: Normal Neck: No JVD Pulmonary: Clear to auscultation bilaterally Cardiac: Regular Rate and Rhythm  Abdomen: Soft, non-tender, non-distended, no mass, no scars Skin: No rash, ruborous right foot with petechiae Extremity Pulses:  2+ radial, brachial, 2+ right absent left femoral, absent popliteal dorsalis pedis, posterior tibial pulses bilaterally Musculoskeletal: No deformity or edema  Neurologic: Upper and lower extremity motor 5/5 and symmetric  DATA:  CBC    Component Value Date/Time   WBC 8.2 01/05/2019 1502   RBC 4.74 01/05/2019 1502   HGB 14.9 01/05/2019 1502   HCT 47.1 01/05/2019 1502   PLT 249 01/05/2019 1502   MCV 99.4 01/05/2019 1502   MCH 31.4 01/05/2019 1502   MCHC 31.6 01/05/2019 1502   RDW 12.7 01/05/2019 1502   LYMPHSABS 1.0 01/05/2019 1502   MONOABS 0.7 01/05/2019 1502   EOSABS 0.1 01/05/2019 1502   BASOSABS 0.1 01/05/2019 1502    BMET    Component Value Date/Time   NA 135 01/05/2019 1502   K 4.3 01/05/2019 1502   CL 94 (L) 01/05/2019 1502   CO2 35 (H) 01/05/2019 1502   GLUCOSE 84 01/05/2019 1502   BUN 8 01/05/2019 1502   CREATININE 0.99 01/05/2019 1502   CALCIUM 9.2 01/05/2019 1502   GFRNONAA >60 01/05/2019 1502   GFRAA >60 01/05/2019 1502    ABI left 0.7, right 0  ASSESSMENT:  Acute worsening of chronic PAD most likely multi level disease   PLAN:  1.  Admit for IV heparin  2. NPO p midnight possible angio intervention by  [place] tomorrow if no space tomorrow will do later this week   Ruta Hinds, MD [facility] Office: [phone] Pager: [phone]

## 2019-01-06 ENCOUNTER — Encounter (HOSPITAL_COMMUNITY)
Admission: EM | Disposition: A | Discharge status: Home / Self Care | Payer: Self-pay | Source: Home / Self Care | Attending: Vascular Surgery

## 2019-01-06 ENCOUNTER — Other Ambulatory Visit: Payer: Self-pay

## 2019-01-06 ENCOUNTER — Encounter (HOSPITAL_COMMUNITY): Payer: Self-pay | Admitting: General Practice

## 2019-01-06 HISTORY — PX: LOWER EXTREMITY ANGIOGRAPHY: CATH118251

## 2019-01-06 LAB — CBC
HCT: 43.8 % (ref 39.0–52.0)
Hemoglobin: 14.1 g/dL (ref 13.0–17.0)
MCH: 31.3 pg (ref 26.0–34.0)
MCHC: 32.2 g/dL (ref 30.0–36.0)
MCV: 97.1 fL (ref 80.0–100.0)
Platelets: 226 10*3/uL (ref 150–400)
RBC: 4.51 MIL/uL (ref 4.22–5.81)
RDW: 12.7 % (ref 11.5–15.5)
WBC: 9.3 10*3/uL (ref 4.0–10.5)
nRBC: 0 % (ref 0.0–0.2)

## 2019-01-06 LAB — URINALYSIS, ROUTINE W REFLEX MICROSCOPIC
Bilirubin Urine: NEGATIVE
Glucose, UA: NEGATIVE mg/dL
Hgb urine dipstick: NEGATIVE
Ketones, ur: NEGATIVE mg/dL
Leukocytes,Ua: NEGATIVE
Nitrite: NEGATIVE
Protein, ur: NEGATIVE mg/dL
Specific Gravity, Urine: 1.012 (ref 1.005–1.030)
pH: 7 (ref 5.0–8.0)

## 2019-01-06 LAB — COMPREHENSIVE METABOLIC PANEL
ALT: 16 U/L (ref 0–44)
AST: 24 U/L (ref 15–41)
Albumin: 3.4 g/dL — ABNORMAL LOW (ref 3.5–5.0)
Alkaline Phosphatase: 56 U/L (ref 38–126)
Anion gap: 10 (ref 5–15)
BUN: 9 mg/dL (ref 8–23)
CO2: 30 mmol/L (ref 22–32)
Calcium: 9 mg/dL (ref 8.9–10.3)
Chloride: 97 mmol/L — ABNORMAL LOW (ref 98–111)
Creatinine, Ser: 0.92 mg/dL (ref 0.61–1.24)
GFR calc Af Amer: >60 mL/min (ref >60)
GFR calc non Af Amer: >60 mL/min (ref >60)
Glucose, Bld: 115 mg/dL — ABNORMAL HIGH (ref 70–99)
Potassium: 4 mmol/L (ref 3.5–5.1)
Sodium: 137 mmol/L (ref 135–145)
Total Bilirubin: 0.9 mg/dL (ref 0.3–1.2)
Total Protein: 6.1 g/dL — ABNORMAL LOW (ref 6.5–8.1)

## 2019-01-06 LAB — HEPARIN LEVEL (UNFRACTIONATED)
Heparin Unfractionated: 0.76 IU/mL — ABNORMAL HIGH (ref 0.30–0.70)
Heparin Unfractionated: 0.73 IU/mL — ABNORMAL HIGH (ref 0.30–0.70)

## 2019-01-06 LAB — TYPE AND SCREEN
ABO/RH(D): A POS
Antibody Screen: NEGATIVE

## 2019-01-06 LAB — MRSA PCR SCREENING: MRSA by PCR: NEGATIVE

## 2019-01-06 LAB — ABO/RH: ABO/RH(D): A POS

## 2019-01-06 SURGERY — LOWER EXTREMITY ANGIOGRAPHY
Anesthesia: LOCAL

## 2019-01-06 MED ORDER — LABETALOL HCL 5 MG/ML IV SOLN
10.0000 mg | INTRAVENOUS | Status: AC | PRN
  Administered 2019-01-06 – 2019-01-07 (×4): 10 mg via INTRAVENOUS
  Filled 2019-01-06 (×4): qty 4

## 2019-01-06 MED ORDER — MIDAZOLAM HCL 2 MG/2ML IJ SOLN
INTRAMUSCULAR | Status: AC
  Filled 2019-01-06: qty 2

## 2019-01-06 MED ORDER — MIDAZOLAM HCL 2 MG/2ML IJ SOLN
INTRAMUSCULAR | Status: DC | PRN
  Administered 2019-01-06: 1 mg via INTRAVENOUS
  Administered 2019-01-06: 2 mg via INTRAVENOUS

## 2019-01-06 MED ORDER — OXYCODONE HCL 5 MG PO TABS: 5.0000 mg | ORAL_TABLET | ORAL | Status: DC | PRN

## 2019-01-06 MED ORDER — MORPHINE SULFATE (PF) 2 MG/ML IV SOLN
2.0000 mg | INTRAVENOUS | Status: DC | PRN
  Administered 2019-01-08 – 2019-01-09 (×3): 2 mg via INTRAVENOUS
  Filled 2019-01-06 (×3): qty 1

## 2019-01-06 MED ORDER — METOPROLOL TARTRATE 25 MG PO TABS
25.0000 mg | ORAL_TABLET | Freq: Two times a day (BID) | ORAL | Status: DC
  Administered 2019-01-06 – 2019-01-09 (×7): 25 mg via ORAL
  Filled 2019-01-06 (×7): qty 1

## 2019-01-06 MED ORDER — IODIXANOL 320 MG/ML IV SOLN
INTRAVENOUS | Status: DC | PRN
  Administered 2019-01-06: 16:00:00 160 mL via INTRA_ARTERIAL

## 2019-01-06 MED ORDER — HEPARIN (PORCINE) IN NACL 1000-0.9 UT/500ML-% IV SOLN
INTRAVENOUS | Status: AC
  Filled 2019-01-06: qty 1000

## 2019-01-06 MED ORDER — FENTANYL CITRATE (PF) 100 MCG/2ML IJ SOLN
INTRAMUSCULAR | Status: AC
  Filled 2019-01-06: qty 2

## 2019-01-06 MED ORDER — DIPHENHYDRAMINE HCL 25 MG PO CAPS
50.0000 mg | ORAL_CAPSULE | Freq: Once | ORAL | Status: AC
  Administered 2019-01-06: 22:00:00 50 mg via ORAL
  Filled 2019-01-06: qty 2

## 2019-01-06 MED ORDER — SODIUM CHLORIDE 0.9% FLUSH
3.0000 mL | Freq: Two times a day (BID) | INTRAVENOUS | Status: DC
  Administered 2019-01-07: 10:00:00 3 mL via INTRAVENOUS

## 2019-01-06 MED ORDER — HYDRALAZINE HCL 20 MG/ML IJ SOLN
5.0000 mg | INTRAMUSCULAR | Status: AC | PRN
  Administered 2019-01-07 (×2): 5 mg via INTRAVENOUS
  Filled 2019-01-06 (×3): qty 1

## 2019-01-06 MED ORDER — HEPARIN (PORCINE) 25000 UT/250ML-% IV SOLN
1050.0000 [IU]/h | INTRAVENOUS | Status: DC
  Administered 2019-01-07 (×2): 1050 [IU]/h via INTRAVENOUS
  Filled 2019-01-06: qty 250

## 2019-01-06 MED ORDER — SODIUM CHLORIDE 0.9 % IV SOLN
250.0000 mL | INTRAVENOUS | Status: DC | PRN
  Administered 2019-01-08: 10:00:00 via INTRAVENOUS

## 2019-01-06 MED ORDER — ONDANSETRON HCL 4 MG/2ML IJ SOLN: 4.0000 mg | Freq: Four times a day (QID) | INTRAMUSCULAR | Status: DC | PRN

## 2019-01-06 MED ORDER — LIDOCAINE HCL (PF) 1 % IJ SOLN
INTRAMUSCULAR | Status: DC | PRN
  Administered 2019-01-06 (×2): 20 mL

## 2019-01-06 MED ORDER — SODIUM CHLORIDE 0.9% FLUSH: 3.0000 mL | INTRAVENOUS | Status: DC | PRN

## 2019-01-06 MED ORDER — FENTANYL CITRATE (PF) 100 MCG/2ML IJ SOLN
INTRAMUSCULAR | Status: DC | PRN
  Administered 2019-01-06: 25 ug via INTRAVENOUS
  Administered 2019-01-06: 50 ug via INTRAVENOUS

## 2019-01-06 MED ORDER — SODIUM CHLORIDE 0.9 % WEIGHT BASED INFUSION
1.0000 mL/kg/h | INTRAVENOUS | Status: AC
  Administered 2019-01-06: 17:00:00 1 mL/kg/h via INTRAVENOUS

## 2019-01-06 MED ORDER — ACETAMINOPHEN 325 MG PO TABS: 650.0000 mg | ORAL_TABLET | ORAL | Status: DC | PRN

## 2019-01-06 MED ORDER — HYDRALAZINE HCL 20 MG/ML IJ SOLN
INTRAMUSCULAR | Status: AC
  Filled 2019-01-06: qty 1

## 2019-01-06 MED ORDER — HEPARIN (PORCINE) IN NACL 1000-0.9 UT/500ML-% IV SOLN
INTRAVENOUS | Status: DC | PRN
  Administered 2019-01-06 (×2): 500 mL

## 2019-01-06 MED ORDER — LIDOCAINE HCL (PF) 1 % IJ SOLN
INTRAMUSCULAR | Status: AC
  Filled 2019-01-06: qty 30

## 2019-01-06 MED ORDER — HYDRALAZINE HCL 20 MG/ML IJ SOLN
INTRAMUSCULAR | Status: DC | PRN
  Administered 2019-01-06 (×2): 10 mg via INTRAVENOUS

## 2019-01-06 SURGICAL SUPPLY — 15 items
CATH OMNI FLUSH 5F 65CM (CATHETERS) ×1 IMPLANT
CLOSURE MYNX CONTROL 5F (Vascular Products) ×1 IMPLANT
GLIDEWIRE NITREX 0.018X80X5 (WIRE) ×1
GUIDEWIRE NITREX 0.018X80X5 (WIRE) IMPLANT
KIT MICROPUNCTURE NIT STIFF (SHEATH) ×1 IMPLANT
KIT PV (KITS) ×2 IMPLANT
SHEATH PINNACLE 5F 10CM (SHEATH) ×2 IMPLANT
SHEATH PROBE COVER 6X72 (BAG) ×1 IMPLANT
STOPCOCK MORSE 400PSI 3WAY (MISCELLANEOUS) ×1 IMPLANT
SYR MEDRAD MARK V 150ML (SYRINGE) ×1 IMPLANT
TRANSDUCER W/STOPCOCK (MISCELLANEOUS) ×2 IMPLANT
TRAY PV CATH (CUSTOM PROCEDURE TRAY) ×2 IMPLANT
TUBING CIL FLEX 10 FLL-RA (TUBING) ×1 IMPLANT
WIRE BENTSON .035X145CM (WIRE) ×1 IMPLANT
WIRE TORQFLEX AUST .018X40CM (WIRE) ×1 IMPLANT

## 2019-01-06 NOTE — Progress Notes (Signed)
Omar Singh for heparin Indication: ischemic leg  No Known Allergies  Patient Measurements: Height: 5\' 9"  (175.3 cm) Weight: 188 lb 4.4 oz (85.4 kg) IBW/kg (Calculated) : 70.7 Heparin Dosing Weight: 83.5kg  Vital Signs: BP: 187/90 (04/21 1143) Pulse Rate: 80 (04/21 1616)  Labs: Recent Labs    01/05/19 1502 01/06/19 0342 01/06/19 1033  HGB 14.9 14.1  --   HCT 47.1 43.8  --   PLT 249 226  --   LABPROT 13.5  --   --   INR 1.0  --   --   HEPARINUNFRC  --  0.76* 0.73*  CREATININE 0.99 0.92  --     Estimated Creatinine Clearance: 85.6 mL/min (by C-G formula based on SCr of 0.92 mg/dL).   Medical History: Past Medical History:  Diagnosis Date  . Acute exacerbation of chronic obstructive pulmonary disease (COPD) ([place])    [name] 03/23/2015  . Alcohol abuse   . CAD (coronary artery disease)    a. s/p multi-link vision stent to the mid RCA in 2007 at [facility].  . Hyperlipidemia   . Hypertension   . Myocardial infarction [facility]) 2008   [name] 03/23/2015  . PAD (peripheral artery disease) ([place])   . Panic attack   . Tobacco abuse     Medications:  Infusions:  . [START ON 01/07/2019] sodium chloride    . sodium chloride    . dextrose 5 % and 0.45% NaCl 50 mL/hr at 01/06/19 0300  . heparin 1,050 Units/hr (01/06/19 1140)    Assessment: 52 yom presented to the ED with leg pain. To start IV heparin for possible ischemic leg. Baseline CBC is WNL. He is not on anticoagulation PTA. His apixaban was recently discontinued.   Pt now s/p vascular angiogram today, to resume heparin 8hr after sheath pull (~1600).  Goal of Therapy:  Heparin level 0.3-0.7 units/ml Monitor platelets by anticoagulation protocol: Yes   Plan:  -Restart heparin infusion at 1050 units/hr at midnight with no bolus -Check 6hr heparin level  Arrie Senate, PharmD, BCPS Clinical Pharmacist [phone] Please check AMION for all [place] numbers 01/06/2019

## 2019-01-06 NOTE — Op Note (Signed)
Patient name: Omar Singh MRN: [id number] DOB: 1951/06/16 Sex: male  01/05/2019 - 01/06/2019 Pre-operative Diagnosis: right leg rest pain Post-operative diagnosis:  Same Surgeon:  [name] Procedure Performed:  1.  Ultrasound-guided access, right femoral artery  2.  Failed ultrasound-guided access, left femoral artery  3.  Abdominal aortogram  4.  Bilateral lower extremity runoff  5.  Conscious sedation 79 minutes  6.  Closure device (Mynx)     Indications: The patient was admitted with right leg rest pain.  He comes in today for angiographic evaluation and possible intervention.  Procedure:  The patient was identified in the holding area and taken to room 8.  The patient was then placed supine on the table and prepped and draped in the usual sterile fashion.  A time out was called.  Conscious sedation was administered with the use of IV fentanyl and Versed under continuous physician and nurse monitoring.  Heart rate, blood pressure, and oxygen saturation were continuously monitored.  Total sedation time was 79 minutes.  I initially used ultrasound to evaluate the left femoral artery.  It was somewhat pulsatile however I could not palpate a pulse.  After using local anesthesia, I cannulated the artery with a micropuncture needle under ultrasound guidance.  I was unable to advance a wire proximally however it would go up into the circumflex branch as well as down into the superficial femoral artery.  I could not get it to advance centrally and so I presume that it was occluded and aborted this access.  Next, ultrasound was used to evaluate the right common femoral artery.  It was patent .  A digital ultrasound image was acquired.  A micropuncture needle was used to access the right common femoral artery under ultrasound guidance.  An 018 wire was advanced without resistance and a micropuncture sheath was placed.  The 018 wire was removed and a benson wire was placed.  The micropuncture  sheath was exchanged for a 5 french sheath.  An omniflush catheter was advanced over the wire to the level of L-1.  An abdominal angiogram was obtained.  Next, the cath was pulled out the aortic bifurcation and bilateral runoff was performed.  Findings:   Aortogram: No significant renal artery stenosis.  The infrarenal abdominal aorta is widely patent.  The right common external iliac artery are patent.  The left common and external iliac artery are occluded.  Right Lower Extremity: The right common femoral artery is calcified but patent.  The profundofemoral artery is also patent the superficial femoral artery is occluded.  The below-knee popliteal artery does appear to reconstitute however it appears diffusely diseased with stenosis at the origin of all 3 tibial vessels.  The anterior tibial artery appears to be the best runoff vessel although it is diminutive.  Left Lower Extremity: There appears to be reconstitution of the distal common femoral artery.  The profundofemoral artery does fill the upper thigh.  The superficial femoral artery is heavily calcified but patent as is the popliteal artery.  There is disease three-vessel runoff.  Images are very limited given proximal obstruction.  Intervention: None  Impression:  #1  Occluded left common and external iliac artery with reconstitution in the groin.  #2  Occluded right superficial femoral artery.  There is reconstitution of a diseased below-knee popliteal artery.  The anterior tibial is the best runoff vessel although it has stenosis/occlusion at its origin  #3  The patient would best be treated with a right  femoral to anterior tibial bypass graft pending his vein mapping.    Theotis Burrow, M.D., [facility] [place] Office: [phone] Pager:  [phone]

## 2019-01-06 NOTE — Progress Notes (Signed)
ANTICOAGULATION CONSULT NOTE - Follow Up Consult  Pharmacy Consult for heparin Indication: ischemic leg  Labs: Recent Labs    01/05/19 1502 01/06/19 0342  HGB 14.9 14.1  HCT 47.1 43.8  PLT 249 226  LABPROT 13.5  --   INR 1.0  --   HEPARINUNFRC  --  0.76*  CREATININE 0.99  --     Assessment: 68yo male supratherapeutic on heparin with initial dosing for ischemic leg; no gtt issues or signs of bleeding per RN.  Goal of Therapy:  Heparin level 0.3-0.7 units/ml   Plan:  Will deccrease heparin gtt by ~1 units/kg/hr to 1200 units/hr and check level in 6 hours.    Wynona Neat, PharmD, BCPS  01/06/2019,4:45 AM

## 2019-01-06 NOTE — Progress Notes (Signed)
Omar Singh for heparin Indication: ischemic leg  No Known Allergies  Patient Measurements: Height: 5\' 9"  (175.3 cm) Weight: 188 lb 4.4 oz (85.4 kg) IBW/kg (Calculated) : 70.7 Heparin Dosing Weight: 83.5kg  Vital Signs: Temp: 97.5 F (36.4 C) (04/21 0322) Temp Source: Oral (04/21 0322) BP: 179/94 (04/21 0800) Pulse Rate: 73 (04/21 0800)  Labs: Recent Labs    01/05/19 1502 01/06/19 0342 01/06/19 1033  HGB 14.9 14.1  --   HCT 47.1 43.8  --   PLT 249 226  --   LABPROT 13.5  --   --   INR 1.0  --   --   HEPARINUNFRC  --  0.76* 0.73*  CREATININE 0.99 0.92  --     Estimated Creatinine Clearance: 85.6 mL/min (by C-G formula based on SCr of 0.92 mg/dL).   Medical History: Past Medical History:  Diagnosis Date  . Acute exacerbation of chronic obstructive pulmonary disease (COPD) ([place])    Omar Singh 03/23/2015  . Alcohol abuse   . CAD (coronary artery disease)    a. s/p multi-link vision stent to the mid RCA in 2007 at [facility].  . Hyperlipidemia   . Hypertension   . Myocardial infarction [facility]) 2008   Omar Singh 03/23/2015  . PAD (peripheral artery disease) ([place])   . Panic attack   . Tobacco abuse     Medications:  Infusions:  . dextrose 5 % and 0.45% NaCl 50 mL/hr at 01/06/19 0300  . heparin 1,200 Units/hr (01/06/19 0500)    Assessment: 85 yom presented to the ED with leg pain. To start IV heparin for possible ischemic leg. Baseline CBC is WNL. He is not on anticoagulation PTA. His apixaban was recently discontinued.   Heparin level came back supratherapeutic at 0.73, on 1200 units/hr. Hgb 14.1, plt 226. No s/sx of bleeding. No infusion issues.   Goal of Therapy:  Heparin level 0.3-0.7 units/ml Monitor platelets by anticoagulation protocol: Yes   Plan:  Reduce heparin gtt to 1050 units/hr Daily heparin level and CBC F/u after angio intervention today   Omar Singh, PharmD, [place] Clinical Pharmacist  Pager:  [phone] Phone: [phone] 01/06/2019,11:14 AM

## 2019-01-06 NOTE — Interval H&P Note (Signed)
History and Physical Interval Note:  01/06/2019 2:23 PM  Omar Singh  has presented today for surgery, with the diagnosis of claudication.  The various methods of treatment have been discussed with the patient and family. After consideration of risks, benefits and other options for treatment, the patient has consented to  Procedure(s): LOWER EXTREMITY ANGIOGRAPHY (N/A) as a surgical intervention.  The patient's history has been reviewed, patient examined, no change in status, stable for surgery.  I have reviewed the patient's chart and labs.  Questions were answered to the patient's satisfaction.     [name]

## 2019-01-07 ENCOUNTER — Inpatient Hospital Stay (HOSPITAL_COMMUNITY): Payer: Medicare PPO

## 2019-01-07 ENCOUNTER — Encounter (HOSPITAL_COMMUNITY): Payer: Self-pay | Admitting: Surgery

## 2019-01-07 DIAGNOSIS — I739 Peripheral vascular disease, unspecified: Secondary | ICD-10-CM

## 2019-01-07 LAB — CBC
HCT: 42.6 % (ref 39.0–52.0)
Hemoglobin: 13.8 g/dL (ref 13.0–17.0)
MCH: 31.3 pg (ref 26.0–34.0)
MCHC: 32.4 g/dL (ref 30.0–36.0)
MCV: 96.6 fL (ref 80.0–100.0)
Platelets: 239 10*3/uL (ref 150–400)
RBC: 4.41 MIL/uL (ref 4.22–5.81)
RDW: 13.2 % (ref 11.5–15.5)
WBC: 9.7 10*3/uL (ref 4.0–10.5)
nRBC: 0 % (ref 0.0–0.2)

## 2019-01-07 LAB — HEPARIN LEVEL (UNFRACTIONATED)
Heparin Unfractionated: 0.2 IU/mL — ABNORMAL LOW (ref 0.30–0.70)
Heparin Unfractionated: 0.25 IU/mL — ABNORMAL LOW (ref 0.30–0.70)
Heparin Unfractionated: 0.39 IU/mL (ref 0.30–0.70)

## 2019-01-07 MED ORDER — CEFAZOLIN SODIUM-DEXTROSE 1-4 GM/50ML-% IV SOLN: 1.0000 g | INTRAVENOUS | Status: DC

## 2019-01-07 MED ORDER — CEFAZOLIN SODIUM-DEXTROSE 2-4 GM/100ML-% IV SOLN
2.0000 g | INTRAVENOUS | Status: AC
  Administered 2019-01-08 (×2): 2 g via INTRAVENOUS
  Filled 2019-01-07: qty 100

## 2019-01-07 MED ORDER — HYDRALAZINE HCL 20 MG/ML IJ SOLN
10.0000 mg | INTRAMUSCULAR | Status: DC | PRN
  Administered 2019-01-07 – 2019-01-08 (×2): 10 mg via INTRAVENOUS
  Filled 2019-01-07 (×2): qty 1

## 2019-01-07 NOTE — Progress Notes (Signed)
Lower extremity vein mapping has been completed.   Preliminary results in CV Proc.   Abram Sander 01/07/2019 10:57 AM

## 2019-01-07 NOTE — Progress Notes (Addendum)
  Progress Note    01/07/2019 8:52 AM 1 Day Post-Op  Subjective:  Says his legs feel better since procedure  Afebrile HR 573'U-202'R systolic HR 42'H-06'C NSR 98% RA  Vitals:   01/07/19 0325 01/07/19 0411  BP: (!) 175/90 (!) 168/89  Pulse:    Resp: (!) 32 (!) 29  Temp:    SpO2:      Physical Exam: General:  No distress Lungs:   Non labored Incisions:  Right groin is soft without hematoma; mild tenderness to palpation   CBC    Component Value Date/Time   WBC 9.7 01/07/2019 0522   RBC 4.41 01/07/2019 0522   HGB 13.8 01/07/2019 0522   HCT 42.6 01/07/2019 0522   PLT 239 01/07/2019 0522   MCV 96.6 01/07/2019 0522   MCH 31.3 01/07/2019 0522   MCHC 32.4 01/07/2019 0522   RDW 13.2 01/07/2019 0522   LYMPHSABS 1.0 01/05/2019 1502   MONOABS 0.7 01/05/2019 1502   EOSABS 0.1 01/05/2019 1502   BASOSABS 0.1 01/05/2019 1502    BMET    Component Value Date/Time   NA 137 01/06/2019 0342   K 4.0 01/06/2019 0342   CL 97 (L) 01/06/2019 0342   CO2 30 01/06/2019 0342   GLUCOSE 115 (H) 01/06/2019 0342   BUN 9 01/06/2019 0342   CREATININE 0.92 01/06/2019 0342   CALCIUM 9.0 01/06/2019 0342   GFRNONAA >60 01/06/2019 0342   GFRAA >60 01/06/2019 0342    INR    Component Value Date/Time   INR 1.0 01/05/2019 1502     Intake/Output Summary (Last 24 hours) at 01/07/2019 0852 Last data filed at 01/07/2019 0400 Gross per 24 hour  Intake 910.23 ml  Output 1125 ml  Net -214.77 ml     Assessment:  68 y.o. male is s/p:  Procedure Performed:             1.  Ultrasound-guided access, right femoral artery             2.  Failed ultrasound-guided access, left femoral artery             3.  Abdominal aortogram             4.  Bilateral lower extremity runoff             5.  Conscious sedation 79 minutes             6.  Closure device (Mynx)  1 Day Post-Op   Plan: -right groin soft without hematoma -plan for vein mapping today and bypass tomorrow.  Discussed this with pt  and let him know MD would be by to have further discussion. -legs feel better with heparin.   -DVT prophylaxis:  Heparin gtt -ok to eat breakfast today -npo after MN tonight   Leontine Locket, PA-C [facility] [phone] 01/07/2019 8:52 AM   I have independently and examined interviewed and examined patient and agree with PA assessment and plan above. OR tomorrow for right fem-AT bypass with vein.  Brandon C. Donzetta Matters, MD [facility] [place] Office: [phone] Pager: [phone]

## 2019-01-07 NOTE — Plan of Care (Signed)
  Problem: Education: Goal: Knowledge of General Education information will improve Description Including pain rating scale, medication(s)/side effects and non-pharmacologic comfort measures Outcome: Progressing   

## 2019-01-07 NOTE — Progress Notes (Signed)
[place] for heparin Indication: ischemic leg  No Known Allergies  Patient Measurements: Height: 5\' 9"  (175.3 cm) Weight: 188 lb 4.4 oz (85.4 kg) IBW/kg (Calculated) : 70.7 Heparin Dosing Weight: 83.5kg  Vital Signs: Temp: 97.7 F (36.5 C) (04/22 1332) Temp Source: Oral (04/22 1332) BP: 194/98 (04/22 1000) Pulse Rate: 85 (04/22 1000)  Labs: Recent Labs    01/05/19 1502 01/06/19 0342  01/07/19 0522 01/07/19 0942 01/07/19 1648  HGB 14.9 14.1  --  13.8  --   --   HCT 47.1 43.8  --  42.6  --   --   PLT 249 226  --  239  --   --   LABPROT 13.5  --   --   --   --   --   INR 1.0  --   --   --   --   --   HEPARINUNFRC  --  0.76*   < > 0.20* 0.25* 0.39  CREATININE 0.99 0.92  --   --   --   --    < > = values in this interval not displayed.    Estimated Creatinine Clearance: 85.6 mL/min (by C-G formula based on SCr of 0.92 mg/dL).   Medical History: Past Medical History:  Diagnosis Date  . Acute exacerbation of chronic obstructive pulmonary disease (COPD) ([place])    [name] 03/23/2015  . Alcohol abuse   . CAD (coronary artery disease)    a. s/p multi-link vision stent to the mid RCA in 2007 at [facility]  . Hyperlipidemia   . Hypertension   . Myocardial infarction [facility]) 2008   [name] 03/23/2015  . PAD (peripheral artery disease) ([place])   . Panic attack   . Tobacco abuse     Medications:  Infusions:  . sodium chloride    . [START ON 01/08/2019]  ceFAZolin (ANCEF) IV    . dextrose 5 % and 0.45% NaCl 50 mL/hr at 01/07/19 0400  . heparin 1,050 Units/hr (01/07/19 0400)    Assessment: 85 yom presented to the ED with leg pain. To start IV heparin for possible ischemic leg. He is not on anticoagulation PTA. His apixaban was recently discontinued.   Heparin level now therapeutic after IV site moved to R [facility] due to IV leaking. CBC WNL. No active bleed issues documented.  Goal of Therapy:  Heparin level 0.3-0.7 units/ml Monitor  platelets by anticoagulation protocol: Yes   Plan:  -Continue heparin infusion at 1050 units/hr   -Confirmatory heparin level with AM labs -Monitor daily heparin level, CBC, and for s/sx of bleeding  Elicia Lamp, PharmD, BCPS Please check AMION for all [place] contact numbers Clinical Pharmacist 01/07/2019 5:33 PM

## 2019-01-07 NOTE — Progress Notes (Signed)
ANTICOAGULATION CONSULT NOTE - Follow Up Consult  Pharmacy Consult for heparin Indication: ischemic leg  Labs: Recent Labs    01/05/19 1502 01/06/19 0342 01/06/19 1033 01/07/19 0522  HGB 14.9 14.1  --  13.8  HCT 47.1 43.8  --  42.6  PLT 249 226  --  239  LABPROT 13.5  --   --   --   INR 1.0  --   --   --   HEPARINUNFRC  --  0.76* 0.73* 0.20*  CREATININE 0.99 0.92  --   --     Assessment/Plan:  68yo male subtherapeutic on heparin after resumed post-aortogram though lab was drawn just 5h after gtt resumed and likely needs time to accumulate. Will continue gtt at current rate for now and recheck level.   Wynona Neat, PharmD, BCPS  01/07/2019,6:48 AM

## 2019-01-07 NOTE — Progress Notes (Addendum)
Omar Singh for heparin Indication: ischemic leg  No Known Allergies  Patient Measurements: Height: 5\' 9"  (175.3 cm) Weight: 188 lb 4.4 oz (85.4 kg) IBW/kg (Calculated) : 70.7 Heparin Dosing Weight: 83.5kg  Vital Signs: Temp: 98 F (36.7 C) (04/22 0311) Temp Source: Oral (04/22 0311) BP: 194/98 (04/22 1000) Pulse Rate: 85 (04/22 1000)  Labs: Recent Labs    01/05/19 1502 01/06/19 0342 01/06/19 1033 01/07/19 0522  HGB 14.9 14.1  --  13.8  HCT 47.1 43.8  --  42.6  PLT 249 226  --  239  LABPROT 13.5  --   --   --   INR 1.0  --   --   --   HEPARINUNFRC  --  0.76* 0.73* 0.20*  CREATININE 0.99 0.92  --   --     Estimated Creatinine Clearance: 85.6 mL/min (by C-G formula based on SCr of 0.92 mg/dL).   Medical History: Past Medical History:  Diagnosis Date  . Acute exacerbation of chronic obstructive pulmonary disease (COPD) ([place])    Omar Singh 03/23/2015  . Alcohol abuse   . CAD (coronary artery disease)    a. s/p multi-link vision stent to the mid RCA in 2007 at [facility].  . Hyperlipidemia   . Hypertension   . Myocardial infarction [facility]) 2008   Omar Singh 03/23/2015  . PAD (peripheral artery disease) ([place])   . Panic attack   . Tobacco abuse     Medications:  Infusions:  . sodium chloride    . dextrose 5 % and 0.45% NaCl 50 mL/hr at 01/07/19 0400  . heparin 1,050 Units/hr (01/07/19 0400)    Assessment: 51 yom presented to the ED with leg pain. To start IV heparin for possible ischemic leg. Baseline CBC is WNL. He is not on anticoagulation PTA. His apixaban was recently discontinued.   Heparin level after restart came back subtherapeutic on 0.2, on 1050 units/hr, after only infusing for 5 hours. Heparin is running in L wrist, lab drawn from R peripheral side. Heparin level drawn at 1000 came back 0.25, on 1050 units/hr. Hgb 13.8, plt 239. No s/sx of bleeding. IV infusion is leaking so moved to R AC.   Goal of Therapy:  Heparin level  0.3-0.7 units/ml Monitor platelets by anticoagulation protocol: Yes   Plan:  -Continue heparin infusion at 1050 units/hr   -Check 6 hr heparin level -Monitor daily HL, CBC, and for s/sx of bleeding  Omar Singh, PharmD, [place] Clinical Pharmacist  Pager: [phone] Phone: [phone] Please check AMION for all [place] numbers 01/07/2019

## 2019-01-08 ENCOUNTER — Inpatient Hospital Stay (HOSPITAL_COMMUNITY): Payer: Medicare PPO

## 2019-01-08 ENCOUNTER — Inpatient Hospital Stay (HOSPITAL_COMMUNITY): Payer: Medicare PPO | Admitting: Anesthesiology

## 2019-01-08 ENCOUNTER — Encounter (HOSPITAL_COMMUNITY)
Admission: EM | Disposition: A | Discharge status: Home / Self Care | Payer: Self-pay | Source: Home / Self Care | Attending: Vascular Surgery

## 2019-01-08 DIAGNOSIS — I70221 Atherosclerosis of native arteries of extremities with rest pain, right leg: Secondary | ICD-10-CM

## 2019-01-08 HISTORY — PX: LOWER EXTREMITY ANGIOGRAM: SHX5508

## 2019-01-08 HISTORY — PX: FEMORAL-TIBIAL BYPASS GRAFT: SHX938

## 2019-01-08 LAB — BASIC METABOLIC PANEL
Anion gap: 10 (ref 5–15)
BUN: 9 mg/dL (ref 8–23)
CO2: 27 mmol/L (ref 22–32)
Calcium: 8.9 mg/dL (ref 8.9–10.3)
Chloride: 100 mmol/L (ref 98–111)
Creatinine, Ser: 0.89 mg/dL (ref 0.61–1.24)
GFR calc Af Amer: >60 mL/min (ref >60)
GFR calc non Af Amer: >60 mL/min (ref >60)
Glucose, Bld: 110 mg/dL — ABNORMAL HIGH (ref 70–99)
Potassium: 4 mmol/L (ref 3.5–5.1)
Sodium: 137 mmol/L (ref 135–145)

## 2019-01-08 LAB — HEPARIN LEVEL (UNFRACTIONATED): Heparin Unfractionated: 0.33 IU/mL (ref 0.30–0.70)

## 2019-01-08 LAB — SURGICAL PCR SCREEN
MRSA, PCR: NEGATIVE
Staphylococcus aureus: NEGATIVE

## 2019-01-08 LAB — CBC
HCT: 43.6 % (ref 39.0–52.0)
Hemoglobin: 14 g/dL (ref 13.0–17.0)
MCH: 31 pg (ref 26.0–34.0)
MCHC: 32.1 g/dL (ref 30.0–36.0)
MCV: 96.7 fL (ref 80.0–100.0)
Platelets: 227 10*3/uL (ref 150–400)
RBC: 4.51 MIL/uL (ref 4.22–5.81)
RDW: 13.5 % (ref 11.5–15.5)
WBC: 9.9 10*3/uL (ref 4.0–10.5)
nRBC: 0 % (ref 0.0–0.2)

## 2019-01-08 SURGERY — CREATION, BYPASS, ARTERIAL, FEMORAL TO TIBIAL, USING GRAFT
Anesthesia: General | Site: Leg Lower | Laterality: Right

## 2019-01-08 MED ORDER — DEXAMETHASONE SODIUM PHOSPHATE 10 MG/ML IJ SOLN
INTRAMUSCULAR | Status: DC | PRN
  Administered 2019-01-08: 10 mg via INTRAVENOUS

## 2019-01-08 MED ORDER — ROCURONIUM BROMIDE 10 MG/ML (PF) SYRINGE
PREFILLED_SYRINGE | INTRAVENOUS | Status: DC | PRN
  Administered 2019-01-08: 20 mg via INTRAVENOUS
  Administered 2019-01-08 (×3): 50 mg via INTRAVENOUS
  Administered 2019-01-08: 30 mg via INTRAVENOUS
  Administered 2019-01-08: 50 mg via INTRAVENOUS

## 2019-01-08 MED ORDER — SODIUM CHLORIDE 0.9 % IV SOLN
INTRAVENOUS | Status: DC | PRN
  Administered 2019-01-08: 30 ug/min via INTRAVENOUS

## 2019-01-08 MED ORDER — SUCCINYLCHOLINE CHLORIDE 200 MG/10ML IV SOSY
PREFILLED_SYRINGE | INTRAVENOUS | Status: DC | PRN
  Administered 2019-01-08: 140 mg via INTRAVENOUS

## 2019-01-08 MED ORDER — HEPARIN SODIUM (PORCINE) 1000 UNIT/ML IJ SOLN
INTRAMUSCULAR | Status: AC
  Filled 2019-01-08: qty 1

## 2019-01-08 MED ORDER — SODIUM CHLORIDE 0.9 % IV SOLN
INTRAVENOUS | Status: AC
  Filled 2019-01-08: qty 1.2

## 2019-01-08 MED ORDER — HEPARIN (PORCINE) 25000 UT/250ML-% IV SOLN
500.0000 [IU]/h | INTRAVENOUS | Status: DC
  Administered 2019-01-08: 20:00:00 500 [IU]/h via INTRAVENOUS
  Filled 2019-01-08: qty 250

## 2019-01-08 MED ORDER — PROMETHAZINE HCL 25 MG/ML IJ SOLN: 6.2500 mg | INTRAMUSCULAR | Status: DC | PRN

## 2019-01-08 MED ORDER — SUCCINYLCHOLINE CHLORIDE 200 MG/10ML IV SOSY
PREFILLED_SYRINGE | INTRAVENOUS | Status: AC
  Filled 2019-01-08: qty 10

## 2019-01-08 MED ORDER — SODIUM CHLORIDE (PF) 0.9 % IJ SOLN
INTRAVENOUS | Status: DC | PRN
  Administered 2019-01-08: 15:00:00 50 mL via INTRAMUSCULAR

## 2019-01-08 MED ORDER — PAPAVERINE HCL 30 MG/ML IJ SOLN
INTRAMUSCULAR | Status: DC | PRN
  Administered 2019-01-08 (×2): 60 mg

## 2019-01-08 MED ORDER — ALBUTEROL SULFATE (2.5 MG/3ML) 0.083% IN NEBU
INHALATION_SOLUTION | RESPIRATORY_TRACT | Status: AC
  Administered 2019-01-08: 18:00:00 2.5 mg
  Filled 2019-01-08: qty 3

## 2019-01-08 MED ORDER — PHENYLEPHRINE 40 MCG/ML (10ML) SYRINGE FOR IV PUSH (FOR BLOOD PRESSURE SUPPORT)
PREFILLED_SYRINGE | INTRAVENOUS | Status: DC | PRN
  Administered 2019-01-08: 80 ug via INTRAVENOUS
  Administered 2019-01-08: 40 ug via INTRAVENOUS
  Administered 2019-01-08 (×2): 80 ug via INTRAVENOUS

## 2019-01-08 MED ORDER — IODIXANOL 320 MG/ML IV SOLN
INTRAVENOUS | Status: DC | PRN
  Administered 2019-01-08: 15:00:00 24 mL

## 2019-01-08 MED ORDER — SODIUM CHLORIDE 0.9 % IV SOLN
INTRAVENOUS | Status: DC
  Administered 2019-01-08: 20:00:00 via INTRAVENOUS

## 2019-01-08 MED ORDER — PROPOFOL 10 MG/ML IV BOLUS
INTRAVENOUS | Status: AC
  Filled 2019-01-08: qty 20

## 2019-01-08 MED ORDER — CEFAZOLIN SODIUM 1 G IJ SOLR
INTRAMUSCULAR | Status: AC
  Filled 2019-01-08: qty 20

## 2019-01-08 MED ORDER — SENNOSIDES-DOCUSATE SODIUM 8.6-50 MG PO TABS: 1.0000 | ORAL_TABLET | Freq: Every evening | ORAL | Status: DC | PRN

## 2019-01-08 MED ORDER — MAGNESIUM SULFATE 2 GM/50ML IV SOLN: 2.0000 g | Freq: Every day | INTRAVENOUS | Status: DC | PRN

## 2019-01-08 MED ORDER — MIDAZOLAM HCL 2 MG/2ML IJ SOLN
INTRAMUSCULAR | Status: AC
  Filled 2019-01-08: qty 2

## 2019-01-08 MED ORDER — ASPIRIN EC 81 MG PO TBEC
81.0000 mg | DELAYED_RELEASE_TABLET | Freq: Every day | ORAL | Status: DC
  Administered 2019-01-09: 10:00:00 81 mg via ORAL
  Filled 2019-01-08: qty 1

## 2019-01-08 MED ORDER — LOSARTAN POTASSIUM 50 MG PO TABS
50.0000 mg | ORAL_TABLET | Freq: Every day | ORAL | Status: DC
  Administered 2019-01-08 – 2019-01-09 (×2): 50 mg via ORAL
  Filled 2019-01-08 (×2): qty 1

## 2019-01-08 MED ORDER — MUPIROCIN 2 % EX OINT
1.0000 "application " | TOPICAL_OINTMENT | Freq: Once | CUTANEOUS | Status: DC
  Filled 2019-01-08: qty 22

## 2019-01-08 MED ORDER — PROTAMINE SULFATE 10 MG/ML IV SOLN
INTRAVENOUS | Status: AC
  Filled 2019-01-08: qty 5

## 2019-01-08 MED ORDER — DEXAMETHASONE SODIUM PHOSPHATE 10 MG/ML IJ SOLN
INTRAMUSCULAR | Status: AC
  Filled 2019-01-08: qty 1

## 2019-01-08 MED ORDER — HEPARIN SODIUM (PORCINE) 1000 UNIT/ML IJ SOLN
INTRAMUSCULAR | Status: DC | PRN
  Administered 2019-01-08: 8500 [IU] via INTRAVENOUS
  Administered 2019-01-08: 2000 [IU] via INTRAVENOUS

## 2019-01-08 MED ORDER — ONDANSETRON HCL 4 MG/2ML IJ SOLN
INTRAMUSCULAR | Status: AC
  Filled 2019-01-08: qty 2

## 2019-01-08 MED ORDER — LIDOCAINE 2% (20 MG/ML) 5 ML SYRINGE
INTRAMUSCULAR | Status: AC
  Filled 2019-01-08: qty 5

## 2019-01-08 MED ORDER — HEPARIN SODIUM (PORCINE) 5000 UNIT/ML IJ SOLN: 5000.0000 [IU] | Freq: Three times a day (TID) | INTRAMUSCULAR | Status: DC

## 2019-01-08 MED ORDER — CEFAZOLIN SODIUM-DEXTROSE 2-4 GM/100ML-% IV SOLN
2.0000 g | Freq: Three times a day (TID) | INTRAVENOUS | Status: DC
  Administered 2019-01-08: 2 g via INTRAVENOUS
  Filled 2019-01-08 (×4): qty 100

## 2019-01-08 MED ORDER — ALBUTEROL SULFATE (2.5 MG/3ML) 0.083% IN NEBU: 2.5000 mg | INHALATION_SOLUTION | Freq: Once | RESPIRATORY_TRACT | Status: DC

## 2019-01-08 MED ORDER — PAPAVERINE HCL 30 MG/ML IJ SOLN
INTRAMUSCULAR | Status: AC
  Filled 2019-01-08: qty 2

## 2019-01-08 MED ORDER — LIDOCAINE 2% (20 MG/ML) 5 ML SYRINGE
INTRAMUSCULAR | Status: DC | PRN
  Administered 2019-01-08: 80 mg via INTRAVENOUS

## 2019-01-08 MED ORDER — ROCURONIUM BROMIDE 50 MG/5ML IV SOSY
PREFILLED_SYRINGE | INTRAVENOUS | Status: AC
  Filled 2019-01-08: qty 10

## 2019-01-08 MED ORDER — ROCURONIUM BROMIDE 50 MG/5ML IV SOSY
PREFILLED_SYRINGE | INTRAVENOUS | Status: AC
  Filled 2019-01-08: qty 5

## 2019-01-08 MED ORDER — SODIUM CHLORIDE 0.9 % IV SOLN: 500.0000 mL | Freq: Once | INTRAVENOUS | Status: DC | PRN

## 2019-01-08 MED ORDER — ATORVASTATIN CALCIUM 40 MG PO TABS: 40.0000 mg | ORAL_TABLET | Freq: Every day | ORAL | Status: DC

## 2019-01-08 MED ORDER — PROTAMINE SULFATE 10 MG/ML IV SOLN
INTRAVENOUS | Status: DC | PRN
  Administered 2019-01-08 (×2): 10 mg via INTRAVENOUS
  Administered 2019-01-08: 20 mg via INTRAVENOUS

## 2019-01-08 MED ORDER — PROPOFOL 10 MG/ML IV BOLUS
INTRAVENOUS | Status: DC | PRN
  Administered 2019-01-08: 150 mg via INTRAVENOUS

## 2019-01-08 MED ORDER — FENTANYL CITRATE (PF) 250 MCG/5ML IJ SOLN
INTRAMUSCULAR | Status: AC
  Filled 2019-01-08: qty 5

## 2019-01-08 MED ORDER — EPHEDRINE SULFATE-NACL 50-0.9 MG/10ML-% IV SOSY
PREFILLED_SYRINGE | INTRAVENOUS | Status: DC | PRN
  Administered 2019-01-08: 10 mg via INTRAVENOUS
  Administered 2019-01-08: 5 mg via INTRAVENOUS
  Administered 2019-01-08: 10 mg via INTRAVENOUS

## 2019-01-08 MED ORDER — FENTANYL CITRATE (PF) 100 MCG/2ML IJ SOLN
25.0000 ug | INTRAMUSCULAR | Status: DC | PRN
  Administered 2019-01-08 (×2): 50 ug via INTRAVENOUS

## 2019-01-08 MED ORDER — FENTANYL CITRATE (PF) 250 MCG/5ML IJ SOLN
INTRAMUSCULAR | Status: DC | PRN
  Administered 2019-01-08: 50 ug via INTRAVENOUS
  Administered 2019-01-08: 100 ug via INTRAVENOUS
  Administered 2019-01-08: 50 ug via INTRAVENOUS
  Administered 2019-01-08 (×2): 25 ug via INTRAVENOUS
  Administered 2019-01-08: 50 ug via INTRAVENOUS

## 2019-01-08 MED ORDER — LACTATED RINGERS IV SOLN
INTRAVENOUS | Status: DC | PRN
  Administered 2019-01-08 (×3): via INTRAVENOUS

## 2019-01-08 MED ORDER — METOPROLOL TARTRATE 25 MG PO TABS: 25.0000 mg | ORAL_TABLET | Freq: Two times a day (BID) | ORAL | Status: DC

## 2019-01-08 MED ORDER — SODIUM CHLORIDE 0.9 % IV SOLN
INTRAVENOUS | Status: DC | PRN
  Administered 2019-01-08 (×2): 500 mL

## 2019-01-08 MED ORDER — POTASSIUM CHLORIDE CRYS ER 20 MEQ PO TBCR: 20.0000 meq | EXTENDED_RELEASE_TABLET | Freq: Every day | ORAL | Status: DC | PRN

## 2019-01-08 MED ORDER — 0.9 % SODIUM CHLORIDE (POUR BTL) OPTIME
TOPICAL | Status: DC | PRN
  Administered 2019-01-08: 10:00:00 2000 mL

## 2019-01-08 MED ORDER — ONDANSETRON HCL 4 MG/2ML IJ SOLN
INTRAMUSCULAR | Status: DC | PRN
  Administered 2019-01-08: 4 mg via INTRAVENOUS

## 2019-01-08 MED ORDER — SODIUM CHLORIDE 0.9 % IV SOLN: INTRAVENOUS | Status: DC

## 2019-01-08 MED ORDER — SUGAMMADEX SODIUM 200 MG/2ML IV SOLN
INTRAVENOUS | Status: DC | PRN
  Administered 2019-01-08: 300 mg via INTRAVENOUS

## 2019-01-08 MED ORDER — BISACODYL 5 MG PO TBEC: 5.0000 mg | DELAYED_RELEASE_TABLET | Freq: Every day | ORAL | Status: DC | PRN

## 2019-01-08 MED ORDER — FENTANYL CITRATE (PF) 100 MCG/2ML IJ SOLN
INTRAMUSCULAR | Status: AC
  Filled 2019-01-08: qty 2

## 2019-01-08 SURGICAL SUPPLY — 71 items
ADH SKN CLS APL DERMABOND .7 (GAUZE/BANDAGES/DRESSINGS) ×4
BANDAGE ESMARK 6X9 LF (GAUZE/BANDAGES/DRESSINGS) IMPLANT
BLADE 11 SAFETY STRL DISP (BLADE) ×3 IMPLANT
BNDG CMPR 9X6 STRL LF SNTH (GAUZE/BANDAGES/DRESSINGS) ×1
BNDG ESMARK 6X9 LF (GAUZE/BANDAGES/DRESSINGS) ×3
CANISTER SUCT 3000ML PPV (MISCELLANEOUS) ×3 IMPLANT
CANNULA VESSEL 3MM 2 BLNT TIP (CANNULA) ×6 IMPLANT
CATH EMB 4FR 40CM (CATHETERS) ×2 IMPLANT
CLIP VESOCCLUDE MED 24/CT (CLIP) ×3 IMPLANT
CLIP VESOCCLUDE SM WIDE 24/CT (CLIP) ×7 IMPLANT
COVER WAND RF STERILE (DRAPES) IMPLANT
CUFF TOURNIQUET SINGLE 18IN (TOURNIQUET CUFF) IMPLANT
CUFF TOURNIQUET SINGLE 24IN (TOURNIQUET CUFF) ×2 IMPLANT
CUFF TOURNIQUET SINGLE 34IN LL (TOURNIQUET CUFF) IMPLANT
CUFF TOURNIQUET SINGLE 44IN (TOURNIQUET CUFF) IMPLANT
DERMABOND ADVANCED (GAUZE/BANDAGES/DRESSINGS) ×8
DERMABOND ADVANCED .7 DNX12 (GAUZE/BANDAGES/DRESSINGS) ×4 IMPLANT
DRAIN CHANNEL 15F RND FF W/TCR (WOUND CARE) IMPLANT
DRAPE HALF SHEET 40X57 (DRAPES) IMPLANT
DRAPE X-RAY CASS 24X20 (DRAPES) ×2 IMPLANT
ELECT REM PT RETURN 9FT ADLT (ELECTROSURGICAL) ×3
ELECTRODE REM PT RTRN 9FT ADLT (ELECTROSURGICAL) ×1 IMPLANT
EVACUATOR SILICONE 100CC (DRAIN) IMPLANT
GAUZE SPONGE 4X4 16PLY XRAY LF (GAUZE/BANDAGES/DRESSINGS) ×2 IMPLANT
GLOVE BIO SURGEON STRL SZ 6.5 (GLOVE) ×1 IMPLANT
GLOVE BIO SURGEON STRL SZ7.5 (GLOVE) ×3 IMPLANT
GLOVE BIO SURGEONS STRL SZ 6.5 (GLOVE) ×1
GLOVE BIOGEL PI IND STRL 6.5 (GLOVE) IMPLANT
GLOVE BIOGEL PI IND STRL 7.0 (GLOVE) IMPLANT
GLOVE BIOGEL PI IND STRL 7.5 (GLOVE) IMPLANT
GLOVE BIOGEL PI IND STRL 8 (GLOVE) ×1 IMPLANT
GLOVE BIOGEL PI INDICATOR 6.5 (GLOVE) ×4
GLOVE BIOGEL PI INDICATOR 7.0 (GLOVE) ×6
GLOVE BIOGEL PI INDICATOR 7.5 (GLOVE) ×2
GLOVE BIOGEL PI INDICATOR 8 (GLOVE) ×6
GLOVE ECLIPSE 7.0 STRL STRAW (GLOVE) ×6 IMPLANT
GLOVE ECLIPSE 8.0 STRL XLNG CF (GLOVE) ×6 IMPLANT
GOWN STRL REUS W/ TWL LRG LVL3 (GOWN DISPOSABLE) ×3 IMPLANT
GOWN STRL REUS W/TWL 2XL LVL3 (GOWN DISPOSABLE) ×4 IMPLANT
GOWN STRL REUS W/TWL LRG LVL3 (GOWN DISPOSABLE) ×12
KIT BASIN OR (CUSTOM PROCEDURE TRAY) ×3 IMPLANT
KIT TURNOVER KIT B (KITS) ×3 IMPLANT
LOOP VESSEL MINI RED (MISCELLANEOUS) ×2 IMPLANT
MARKER GRAFT CORONARY BYPASS (MISCELLANEOUS) ×2 IMPLANT
NS IRRIG 1000ML POUR BTL (IV SOLUTION) ×6 IMPLANT
PACK PERIPHERAL VASCULAR (CUSTOM PROCEDURE TRAY) ×3 IMPLANT
PAD ARMBOARD 7.5X6 YLW CONV (MISCELLANEOUS) ×6 IMPLANT
PADDING CAST COTTON 6X4 STRL (CAST SUPPLIES) ×2 IMPLANT
SET COLLECT BLD 21X3/4 12 (NEEDLE) ×2 IMPLANT
SPONGE INTESTINAL PEANUT (DISPOSABLE) ×2 IMPLANT
SPONGE SURGIFOAM ABS GEL 100 (HEMOSTASIS) IMPLANT
STOPCOCK 4 WAY LG BORE MALE ST (IV SETS) ×2 IMPLANT
SUT ETHILON 3 0 PS 1 (SUTURE) ×3 IMPLANT
SUT PROLENE 5 0 C 1 24 (SUTURE) ×11 IMPLANT
SUT PROLENE 6 0 BV (SUTURE) ×11 IMPLANT
SUT SILK 2 0 (SUTURE) ×3
SUT SILK 2 0 PERMA HAND 18 BK (SUTURE) IMPLANT
SUT SILK 2 0 SH (SUTURE) ×2 IMPLANT
SUT SILK 2-0 18XBRD TIE 12 (SUTURE) IMPLANT
SUT SILK 3 0 (SUTURE) ×9
SUT SILK 3-0 18XBRD TIE 12 (SUTURE) IMPLANT
SUT VIC AB 2-0 CTB1 (SUTURE) ×6 IMPLANT
SUT VIC AB 3-0 SH 27 (SUTURE) ×18
SUT VIC AB 3-0 SH 27X BRD (SUTURE) ×2 IMPLANT
SUT VICRYL 4-0 PS2 18IN ABS (SUTURE) ×16 IMPLANT
TAPE UMBILICAL COTTON 1/8X30 (MISCELLANEOUS) ×3 IMPLANT
TOWEL GREEN STERILE (TOWEL DISPOSABLE) ×3 IMPLANT
TRAY FOLEY MTR SLVR 16FR STAT (SET/KITS/TRAYS/PACK) ×3 IMPLANT
TUBING EXTENTION W/L.L. (IV SETS) ×2 IMPLANT
UNDERPAD 30X30 (UNDERPADS AND DIAPERS) ×3 IMPLANT
WATER STERILE IRR 1000ML POUR (IV SOLUTION) ×3 IMPLANT

## 2019-01-08 NOTE — Progress Notes (Signed)
B.P. 154/72 .

## 2019-01-08 NOTE — Anesthesia Procedure Notes (Signed)
Procedure Name: Intubation Date/Time: 01/08/2019 10:57 AM Performed by: Teressa Lower., CRNA Pre-anesthesia Checklist: Patient identified, Emergency Drugs available, Suction available and Patient being monitored Patient Re-evaluated:Patient Re-evaluated prior to induction Oxygen Delivery Method: Circle system utilized Preoxygenation: Pre-oxygenation with 100% oxygen Induction Type: IV induction and Rapid sequence Laryngoscope Size: Mac and 4 Grade View: Grade I Tube type: Oral Tube size: 7.5 mm Number of attempts: 1 Airway Equipment and Method: Stylet Placement Confirmation: ETT inserted through vocal cords under direct vision,  positive ETCO2 and breath sounds checked- equal and bilateral Secured at: 23 cm Tube secured with: Tape Dental Injury: Teeth and Oropharynx as per pre-operative assessment

## 2019-01-08 NOTE — Anesthesia Preprocedure Evaluation (Addendum)
Anesthesia Evaluation  Patient identified by MRN, date of birth, ID band Patient awake    Reviewed: Allergy & Precautions, NPO status , Patient's Chart, lab work & pertinent test results, reviewed documented beta blocker date and time   Airway Mallampati: II  TM Distance: >3 FB     Dental  (+) Dental Advisory Given   Pulmonary COPD, Current Smoker,    breath sounds clear to auscultation       Cardiovascular hypertension, Pt. on medications and Pt. on home beta blockers + CAD, + Past MI, + Cardiac Stents and + Peripheral Vascular Disease   Rhythm:Regular Rate:Normal     Neuro/Psych negative neurological ROS     GI/Hepatic negative GI ROS, Neg liver ROS,   Endo/Other  negative endocrine ROS  Renal/GU negative Renal ROS     Musculoskeletal   Abdominal   Peds  Hematology negative hematology ROS (+)   Anesthesia Other Findings   Reproductive/Obstetrics                             Lab Results  Component Value Date   WBC 9.9 01/08/2019   HGB 14.0 01/08/2019   HCT 43.6 01/08/2019   MCV 96.7 01/08/2019   PLT 227 01/08/2019   Lab Results  Component Value Date   CREATININE 0.89 01/08/2019   BUN 9 01/08/2019   NA 137 01/08/2019   K 4.0 01/08/2019   CL 100 01/08/2019   CO2 27 01/08/2019    Anesthesia Physical Anesthesia Plan  ASA: III  Anesthesia Plan: General   Post-op Pain Management:    Induction: Intravenous and Rapid sequence  PONV Risk Score and Plan: 1 and Dexamethasone, Ondansetron and Treatment may vary due to age or medical condition  Airway Management Planned: Oral ETT  Additional Equipment:   Intra-op Plan:   Post-operative Plan: Extubation in OR  Informed Consent: I have reviewed the patients History and Physical, chart, labs and discussed the procedure including the risks, benefits and alternatives for the proposed anesthesia with the patient or authorized  representative who has indicated his/her understanding and acceptance.     Dental advisory given  Plan Discussed with: CRNA  Anesthesia Plan Comments:         Anesthesia Quick Evaluation

## 2019-01-08 NOTE — Transfer of Care (Signed)
Immediate Anesthesia Transfer of Care Note  Patient: Omar Singh  Procedure(s) Performed: RIGHT FEMORAL Endartarectomy and profundoplasty using greater saphenous vein graft. Right anterior tibial artery bypass with nonreversed saphenous vein graft (Right Leg Lower) Lower Extremity Angiogram (Right Leg Lower)  Patient Location: PACU  Anesthesia Type:General  Level of Consciousness: drowsy  Airway & Oxygen Therapy: Patient Spontanous Breathing and Patient connected to face mask oxygen  Post-op Assessment: Report given to RN and Post -op Vital signs reviewed and stable  Post vital signs: Reviewed and stable  Last Vitals:  Vitals Value Taken Time  BP 133/60 01/08/2019  4:49 PM  Temp    Pulse 74 01/08/2019  4:51 PM  Resp 15 01/08/2019  4:51 PM  SpO2 99 % 01/08/2019  4:51 PM  Vitals shown include unvalidated device data.  Last Pain:  Vitals:   01/08/19 0829  TempSrc: Oral  PainSc: 2       Patients Stated Pain Goal: 0 (11/57/26 2035)  Complications: No apparent anesthesia complications

## 2019-01-08 NOTE — Progress Notes (Signed)
   VASCULAR SURGERY ASSESSMENT & PLAN:   CRITICAL LIMB ISCHEMIA RIGHT LOWER EXTREMITY: This patient has rest pain of the right foot.  Based on his arteriogram he is a candidate for a right femoral to anterior tibial artery bypass as his best chance for limb salvage.  Unfortunately the vein is marginal.  If he does not have adequate vein for an entire autogenous bypass, then I will try to do a composite PTFE/vein graft.  I have discussed the indications for the procedure with the patient and the potential complications including, but not limited to, graft thrombosis, limb loss, wound healing problems and infection.  He understands we will need to follow the graft closely postoperatively.  All of his questions were answered and he is agreeable to proceed.  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  However, currently has minimal symptoms in the left leg and only has some claudication.  He denies any rest pain.  He is a smoker and we have discussed the importance of tobacco cessation.  He has extensive collaterals so this is a longstanding occlusion.   SUBJECTIVE:   No specific complaints this morning except for persistent pain in the right foot.  No symptoms on the left.  PHYSICAL EXAM:   Vitals:   01/07/19 1332 01/07/19 1949 01/07/19 2127 01/08/19 0335  BP:  (!) 173/80 (!) 154/72 (!) 169/86  Pulse:    79  Resp:  (!) 24 (!) 24 19  Temp: 97.7 F (36.5 C) 98.1 F (36.7 C)  97.8 F (36.6 C)  TempSrc: Oral Oral  Axillary  SpO2: 94% 95%  91%  Weight:      Height:       Right foot is slightly cool.  LABS:   Lab Results  Component Value Date   WBC 9.9 01/08/2019   HGB 14.0 01/08/2019   HCT 43.6 01/08/2019   MCV 96.7 01/08/2019   PLT 227 01/08/2019   Lab Results  Component Value Date   CREATININE 0.89 01/08/2019   Lab Results  Component Value Date   INR 1.0 01/05/2019    PROBLEM LIST:     Active Problems:   PAD (peripheral artery disease) (H[facility])   CURRENT MEDS:   . docusate sodium  100 mg Oral BID  . metoprolol tartrate  25 mg Oral BID  . pantoprazole  40 mg Oral Daily  . potassium chloride  20-40 mEq Oral Once  . sodium chloride flush  3 mL Intravenous Q12H    Deitra Mayo Beeper: [phone] Office: [phone] 01/08/2019

## 2019-01-08 NOTE — Progress Notes (Signed)
ANTICOAGULATION CONSULT NOTE - Follow Up Consult  Pharmacy Consult for Heparin Indication: LE ischemia  No Known Allergies  Patient Measurements: Height: 5\' 9"  (175.3 cm) Weight: 188 lb 4.4 oz (85.4 kg) IBW/kg (Calculated) : 70.7 Heparin Dosing Weight:   Vital Signs: Temp: 97.7 F (36.5 C) (04/23 0829) Temp Source: Oral (04/23 0829) BP: 169/86 (04/23 0335) Pulse Rate: 79 (04/23 0335)  Labs: Recent Labs    01/05/19 1502 01/06/19 0342  01/07/19 0522 01/07/19 0942 01/07/19 1648 01/08/19 0255  HGB 14.9 14.1  --  13.8  --   --  14.0  HCT 47.1 43.8  --  42.6  --   --  43.6  PLT 249 226  --  239  --   --  227  LABPROT 13.5  --   --   --   --   --   --   INR 1.0  --   --   --   --   --   --   HEPARINUNFRC  --  0.76*   < > 0.20* 0.25* 0.39 0.33  CREATININE 0.99 0.92  --   --   --   --  0.89   < > = values in this interval not displayed.    Estimated Creatinine Clearance: 88.5 mL/min (by C-G formula based on SCr of 0.89 mg/dL).   Assessment:  Anticoag: Heparin for ischemic leg - CBC WNL, no AC PTA - recently stopped apixaban on last admission. Hep level 0.33 in goal. CBC WNL   Goal of Therapy:  Heparin level 0.3-0.7 units/ml Monitor platelets by anticoagulation protocol: Yes   Plan:  -Continue heparin infusion at 1050 units/hr   -Monitor daily heparin level, CBC, and for s/sx of bleeding - right femoral to anterior tibial artery bypass    Shivaan Tierno S. Alford Highland, PharmD, BCPS Clinical Staff Pharmacist Eilene Ghazi [place] 01/08/2019,9:15 AM

## 2019-01-08 NOTE — Progress Notes (Signed)
Dr. Donnetta Hutching paged and call return . Dr Donnetta Hutching made aware of High B.P. 173/80 PRN  B.P.meds had been completed. See new orders for Hydralazine 10 mg I.V. Q 2 hrs. Prn SBP >160  . Hydralazine 10 mg I.V. given R.N. aware.

## 2019-01-08 NOTE — Progress Notes (Signed)
B.P. 169/86 Hydralazine 10 mg I.V. given.

## 2019-01-08 NOTE — Op Note (Signed)
NAME: Omar Singh    MRN: [id number] DOB: June 05, 1951    DATE OF OPERATION: 01/08/2019  PREOP DIAGNOSIS:    Critical limb ischemia right lower extremity  POSTOP DIAGNOSIS:    Same  PROCEDURE:    1. Extensive endarterectomy of right external iliac artery, common femoral artery, and deep femoral artery 2. Profundoplasty with vein patch angioplasty using right great saphenous vein 3. Right femoral to anterior tibial artery bypass with non-reversed translocated saphenous vein graft 4. Intraoperative arteriogram  SURGEON: Judeth Cornfield. Scot Dock, MD, FACS  ASSIST: Laurence Slate, PA  ANESTHESIA: General  EBL: 100 cc  INDICATIONS:    Omar Singh is a 68 y.o. male who presented with rest pain of the right foot.  His arteriogram showed an occluded superficial femoral artery and a tight stenosis in the proximal deep femoral artery.  There is reconstitution of the anterior tibial artery which was small.  Vein map showed that the vein was narrow in some areas but otherwise reasonable.  FINDINGS:   There was extensive plaque in the common femoral, and deep femoral artery requiring extensive endarterectomy.  The anterior tibial artery was small.  The distal 5 cm of the saphenous vein graft narrowed down to about 3 mm.  However, this provided a good size match for the very small anterior tibial artery.  At the completion of the procedure the patient had an excellent anterior tibial signal with the Doppler.  TECHNIQUE:   The patient was taken to the operating room and received a general anesthetic.  The right lower extremity was prepped and draped in usual sterile fashion.  Longitudinal incision was made along the anterior lateral aspect of the right leg to expose the anterior tibial artery to be sure that this was in fact reasonable to bypass into.  The dissection was carried down between the muscle bellies of the tibialis anterior and extensor hallucis longus.  The anterior tibial  artery was identified at the base of the wound and was small but there was one soft area proximally that I thought I could bypass into.  Of note there was no Doppler flow in the artery likely because of the tight stenosis in the proximal deep femoral artery in addition to the superficial femoral artery occlusion.    Next a longitudinal incision was made in the right groin.  Through this incision the common femoral, external iliac artery, multiple side branches, the deep femoral artery, a large posterior branch, and the superficial femoral artery were dissected free.  There was extensive plaque throughout the common femoral artery extending down to the deep femoral and superficial femoral arteries.  In order to get to a patent segment of the deep femoral artery I did dissect down to the secondary branches where I felt like it clamped the distal deep femoral artery.  I felt that the patient would clearly require extensive endarterectomy and vein patch angioplasty.  Next using 6 additional incisions along the medial aspect of the right leg leaving skin bridges, the great saphenous vein was harvested from the saphenofemoral junction down to the ankle.  Branches were divided between clips and 3-0 silk ties.  The vein narrowed down in one segment but became larger again distally.  The saphenofemoral junction was clamped and the saphenous vein excised from the femoral vein.  The femoral vein was oversewn with a 5-0 Prolene suture.  The proximal bowel was excised.  I then gently distended the vein with heparinized saline and it appeared  that the best segment to patch the common femoral and deep femoral arteries was the distal segment.  This would allow hopefully adequate length of the remaining graft to bypass to the anterior tibial artery.  A tunnel was created from the anterior tibial artery to the groin incision laterally.  The patient was then heparinized.  Attention was then turned to the right groin.  The  external iliac artery was clamped.  The deep femoral artery was clamped distally and and distal branches were controlled with Vesseloops.  The superficial femoral artery was occluded.  Side branches were controlled.  A longitudinal arteriotomy was made in the common femoral artery and approximately the deep femoral artery was essentially occluded.  I was able to find a lumen distally and then connected to arteriotomies.  The arteriotomy was extended up to the external iliac artery.  Extensive endarterectomy was performed of the external iliac artery, common femoral artery, and deep femoral artery.  The patient had extensive coral reef plaque in the common femoral artery and deep femoral artery.  Once the endarterectomy was completed the vessel irrigated with copious amounts of saline I opened the distal segment of the vein longitudinally and took just enough of this to be used as a vein patch.  The vein patch was sewn using 5-0 Prolene suture.  This was used in a reversed fashion.  Prior to completing this anastomosis the arteries were backbled and flushed appropriately and the anastomosis completed.  At this point there was an excellent signal in the distal deep femoral artery.  Patient did receive an additional 2000 units of IV heparin.  Next the saphenous vein was brought to the field for anastomosis to the vein patch in a non-reversed fashion.  The vessels were controlled again and clamped proximally.  A longitudinal graftotomy was made in the vein patch and the vein was sewn end-to-side to the vein patch using continuous 6-0 Prolene suture.  Prior to completing this anastomosis the arteries were backbled and flushed and then the anastomosis completed and the clamps released.  Next using a retrograde [place] valvulotome the valves were lysed and the saphenous vein.  The vein was then flushed and there was excellent flow through the vein.  It was then marked to prevent twisting.  The vein was then brought to the  previously created tunnel.  Tourniquet was placed on the thigh.  The leg was exsanguinated with an Esmarch bandage and the tourniquet inflated to 250 mmHg.  Under tourniquet control a longitudinal arteriotomy was made in the anterior tibial artery.  The vein was cut to the appropriate length spatulated and sewn end-to-side to the anterior tibial artery using continuous 6-0 Prolene suture.  Of note the vein becomes somewhat smaller distally and about the distal 5 cm of the vein were 3 mm in diameter.  Prior to completing the anastomosis the tourniquet was released.  The arteries were backbled and flushed appropriately and the anastomosis completed.  An intraoperative arteriogram was then obtained by cannulating the proximal graft.  This showed no technical problems.  The vein was somewhat small distally as was the artery.  There was an excellent anterior tibial signal just above the ankle at this point.  The heparin was partially reversed with protamine.  Each of the vein harvest incisions was closed with a deep layer of 3-0 Vicryl and the skin closed with 4-0 Vicryl.  The groin incision was closed with 2 deep layers of 2-0 Vicryl, a subcutaneous layer with 3-0 Vicryl  and the skin closed with 4-0 Vicryl.  Incision over the distal anastomosis was irrigated and the anastomosis inspected.  There was no bleeding.  This was closed with a deep layer of 3-0 Vicryl and the skin closed with 4-0 Vicryl.  Patient had an easily palpable graft pulse along the lateral aspect of the leg.  Patient tolerated the procedure well was transferred to the recovery room in stable condition.  All needle and sponge counts were correct.  Deitra Mayo, MD, FACS [facility]  DATE OF DICTATION:   01/08/2019

## 2019-01-09 ENCOUNTER — Encounter (HOSPITAL_COMMUNITY): Payer: Medicare PPO

## 2019-01-09 ENCOUNTER — Encounter (HOSPITAL_COMMUNITY): Payer: Self-pay | Admitting: Vascular Surgery

## 2019-01-09 LAB — CBC
HCT: 39 % (ref 39.0–52.0)
Hemoglobin: 12.1 g/dL — ABNORMAL LOW (ref 13.0–17.0)
MCH: 30.8 pg (ref 26.0–34.0)
MCHC: 31 g/dL (ref 30.0–36.0)
MCV: 99.2 fL (ref 80.0–100.0)
Platelets: 211 10*3/uL (ref 150–400)
RBC: 3.93 MIL/uL — ABNORMAL LOW (ref 4.22–5.81)
RDW: 13.4 % (ref 11.5–15.5)
WBC: 12.3 10*3/uL — ABNORMAL HIGH (ref 4.0–10.5)
nRBC: 0 % (ref 0.0–0.2)

## 2019-01-09 LAB — BASIC METABOLIC PANEL
Anion gap: 8 (ref 5–15)
BUN: 10 mg/dL (ref 8–23)
CO2: 27 mmol/L (ref 22–32)
Calcium: 8.4 mg/dL — ABNORMAL LOW (ref 8.9–10.3)
Chloride: 101 mmol/L (ref 98–111)
Creatinine, Ser: 0.94 mg/dL (ref 0.61–1.24)
GFR calc Af Amer: >60 mL/min (ref >60)
GFR calc non Af Amer: >60 mL/min (ref >60)
Glucose, Bld: 127 mg/dL — ABNORMAL HIGH (ref 70–99)
Potassium: 4.5 mmol/L (ref 3.5–5.1)
Sodium: 136 mmol/L (ref 135–145)

## 2019-01-09 LAB — HEPARIN LEVEL (UNFRACTIONATED): Heparin Unfractionated: <0.1 IU/mL — ABNORMAL LOW (ref 0.30–0.70)

## 2019-01-09 MED ORDER — OXYCODONE HCL 5 MG PO TABS: 5.0000 mg | ORAL_TABLET | Freq: Four times a day (QID) | ORAL | 0 refills | Status: DC | PRN

## 2019-01-09 NOTE — Discharge Instructions (Signed)
 [facility]  Discharge instructions  Lower Extremity Bypass Surgery  Please refer to the following instruction for your post-procedure care. Your surgeon or physician assistant will discuss any changes with you.  Activity  You are encouraged to walk as much as you can. You can slowly return to normal activities during the month after your surgery. Avoid strenuous activity and heavy lifting until your doctor tells you it's OK. Avoid activities such as vacuuming or swinging a golf club. Do not drive until your doctor give the OK and you are no longer taking prescription pain medications. It is also normal to have difficulty with sleep habits, eating and bowel movement after surgery. These will go away with time.  Bathing/Showering  You may shower after you go home. Do not soak in a bathtub, hot tub, or swim until the incision heals completely.  Incision Care  Clean your incision with mild soap and water. Shower every day. Pat the area dry with a clean towel. You do not need a bandage unless otherwise instructed. Do not apply any ointments or creams to your incision. If you have open wounds you will be instructed how to care for them or a visiting nurse may be arranged for you. If you have staples or sutures along your incision they will be removed at your post-op appointment. You may have skin glue on your incision. Do not peel it off. It will come off on its own in about one week. If you have a great deal of moisture in your groin, use a gauze help keep this area dry.  Diet  Resume your normal diet. There are no special food restrictions following this procedure. A low fat/ low cholesterol diet is recommended for all patients with vascular disease. In order to heal from your surgery, it is CRITICAL to get adequate nutrition. Your body requires vitamins, minerals, and protein. Vegetables are the best source of vitamins and minerals. Vegetables also provide the  perfect balance of protein. Processed food has little nutritional value, so try to avoid this.  Medications  Resume taking all your medications unless your doctor or nurse practitioner tells you not to. If your incision is causing pain, you may take over-the-counter pain relievers such as acetaminophen (Tylenol). If you were prescribed a stronger pain medication, please aware these medication can cause nausea and constipation. Prevent nausea by taking the medication with a snack or meal. Avoid constipation by drinking plenty of fluids and eating foods with high amount of fiber, such as fruits, vegetables, and grains. Take Colase 100 mg (an over-the-counter stool softener) twice a day as needed for constipation. Do not take Tylenol if you are taking prescription pain medications.  Follow Up  Our office will schedule a follow up appointment 2-3 weeks following discharge.  Please call us immediately for any of the following conditions  Severe or worsening pain in your legs or feet while at rest or while walking Increase pain, redness, warmth, or drainage (pus) from your incision site(s) Fever of 101 degree or higher The swelling in your leg with the bypass suddenly worsens and becomes more painful than when you were in the hospital If you have been instructed to feel your graft pulse then you should do so every day. If you can no longer feel this pulse, call the office immediately. Not all patients are given this instruction.  Leg swelling is common after leg bypass surgery.  The swelling should improve over a few months   following surgery. To improve the swelling, you may elevate your legs above the level of your heart while you are sitting or resting. Your surgeon or physician assistant may ask you to apply an ACE wrap or wear compression (TED) stockings to help to reduce swelling.  Reduce your risk of vascular disease  Stop smoking. If you would like help call [facility] at 1-800-QUIT-NOW  (1-800-784-8669) or [place] at 336-586-4000.  Manage your cholesterol Maintain a desired weight Control your diabetes weight Control your diabetes Keep your blood pressure down  If you have any questions, please call the office at [phone]   

## 2019-01-09 NOTE — Consult Note (Signed)
   [facility] Inpatient Consult   01/09/2019  Omar Singh 1951/08/22 [id number]  We have reviewed your referral request and assigned this patient for outreach with General EMMI follow up.  Natividad Brood, RN BSN [facility] Liaison  [phone] business mobile phone Toll free office [phone]

## 2019-01-09 NOTE — Care Management Important Message (Signed)
Important Message  Patient Details  Name: Omar Singh MRN: [id number] Date of Birth: 1950-09-30   Medicare Important Message Given:  Yes    Orbie Pyo 01/09/2019, 2:07 PM

## 2019-01-09 NOTE — Evaluation (Signed)
Physical Therapy Evaluation Patient Details Name: Omar Singh MRN: [id number] DOB: Mar 13, 1951 Today's Date: 01/09/2019   History of Present Illness  Pt is a 68 y/o male presented with with Critical limb ischemia right lower extremity. He is now s/p endarterectomy of right external iliac artery, common femoral artery, and deep femoral artery, right great saphenous vein, Right femoral to anterior tibial artery bypass with non-reversed translocated saphenous vein graft, Intraoperative arteriogram. Other medical problems include COPD, CAD, hyperlipid, hypertension, tobacco abuse.  Clinical Impression  Pt is close to baseline functioning and should be safe at home with wife's assist. There are no further acute PT needs.  Will sign off at this time.     Follow Up Recommendations No PT follow up    Equipment Recommendations  Rolling walker with 5" wheels    Recommendations for Other Services       Precautions / Restrictions Precautions Precautions: Fall Restrictions Weight Bearing Restrictions: No      Mobility  Bed Mobility               General bed mobility comments: OOB in recliner upon entry  Transfers Overall transfer level: Needs assistance Equipment used: Rolling walker (2 wheeled) Transfers: Sit to/from Stand Sit to Stand: Supervision         General transfer comment: safe technique  Ambulation/Gait Ambulation/Gait assistance: Supervision Gait Distance (Feet): 30 Feet(100 with RW, short distances without AD) Assistive device: Rolling walker (2 wheeled);None(<10 ' without AD) Gait Pattern/deviations: Step-through pattern Gait velocity: slower Gait velocity interpretation: <1.8 ft/sec, indicate of risk for recurrent falls General Gait Details: antalgic, but steady  Stairs Stairs: Yes Stairs assistance: Supervision Stair Management: One rail Left;Step to pattern;Alternating pattern;Forwards Number of Stairs: 4 General stair comments: safe, pt learned  that step to pattern was much more comfortable  Wheelchair Mobility    Modified Rankin (Stroke Patients Only)       Balance Overall balance assessment: Mild deficits observed, not formally tested                                           Pertinent Vitals/Pain Pain Assessment: Faces Faces Pain Scale: Hurts a little bit Pain Location: Leg Pain Descriptors / Indicators: Burning Pain Intervention(s): Monitored during session    Home Living Family/patient expects to be discharged to:: Private residence Living Arrangements: Spouse/significant other Available Help at Discharge: Family;Available 24 hours/day Type of Home: House Home Access: Stairs to enter Entrance Stairs-Rails: Right;Left;Can reach both Entrance Stairs-Number of Steps: 4 Home Layout: One level Home Equipment: Shower seat;Grab bars - tub/shower      Prior Function Level of Independence: Independent with assistive device(s)         Comments: using RW for mobility recently     Hand Dominance        Extremity/Trunk Assessment   Upper Extremity Assessment Upper Extremity Assessment: Overall WFL for tasks assessed    Lower Extremity Assessment Lower Extremity Assessment: Overall WFL for tasks assessed    Cervical / Trunk Assessment Cervical / Trunk Assessment: Normal  Communication   Communication: No difficulties  Cognition Arousal/Alertness: Awake/alert Behavior During Therapy: WFL for tasks assessed/performed(very eager to go home-dry humor) Overall Cognitive Status: Within Functional Limits for tasks assessed  General Comments      Exercises     Assessment/Plan    PT Assessment Patent does not need any further PT services  PT Problem List         PT Treatment Interventions      PT Goals (Current goals can be found in the Care Plan section)  Acute Rehab PT Goals Patient Stated Goal: get home ASAP PT Goal  Formulation: All assessment and education complete, DC therapy    Frequency     Barriers to discharge        Co-evaluation               AM-PAC PT "6 Clicks" Mobility  Outcome Measure Help needed turning from your back to your side while in a flat bed without using bedrails?: None Help needed moving from lying on your back to sitting on the side of a flat bed without using bedrails?: None Help needed moving to and from a bed to a chair (including a wheelchair)?: None Help needed standing up from a chair using your arms (e.g., wheelchair or bedside chair)?: None Help needed to walk in hospital room?: None Help needed climbing 3-5 steps with a railing? : None 6 Click Score: 24    End of Session   Activity Tolerance: Patient tolerated treatment well Patient left: in chair;with call bell/phone within reach Nurse Communication: Mobility status PT Visit Diagnosis: Other abnormalities of gait and mobility (R26.89);Pain Pain - Right/Left: Right Pain - part of body: Leg    Time: 1005-1025 PT Time Calculation (min) (ACUTE ONLY): 20 min   Charges:   PT Evaluation $PT Eval Low Complexity: 1 Low          01/09/2019  Donnella Sham, PT Acute Rehabilitation Services [phone]  (pager) [phone]  (office)  Tessie Fass Marigrace Mccole 01/09/2019, 11:32 AM

## 2019-01-09 NOTE — Evaluation (Signed)
Occupational Therapy Evaluation and Discharge Patient Details Name: Omar Singh MRN: [id number] DOB: April 17, 1951 Today's Date: 01/09/2019    History of Present Illness Pt is a 68 y/o male presented with with Critical limb ischemia right lower extremity. He is now s/p endarterectomy of right external iliac artery, common femoral artery, and deep femoral artery, right great saphenous vein, Right femoral to anterior tibial artery bypass with non-reversed translocated saphenous vein graft, Intraoperative arteriogram. Other medical problems include COPD, CAD, hyperlipid, hypertension, tobacco abuse.   Clinical Impression   PTA Pt was walking about 10 ft with RW and dependent on his wife for LB ADL. Today Pt is supervision with RW for in room mobility and transfers, min A for LB dressing, but supervision for all other ADL in standing. He does fatigue in standing and so educated on sitting for bathing and having stools for prolonged grooming or IADL (like cooking). Pt verbalized and demonstrated understanding. OT education complete. Pt has appropriate DME at home and had no questions at end of session. OT to sign off.     Follow Up Recommendations  No OT follow up;Supervision - Intermittent    Equipment Recommendations  None recommended by OT(Pt has appropriate DME)    Recommendations for Other Services       Precautions / Restrictions Precautions Precautions: Fall Restrictions Weight Bearing Restrictions: No      Mobility Bed Mobility               General bed mobility comments: OOB in recliner upon entry  Transfers Overall transfer level: Needs assistance Equipment used: Rolling walker (2 wheeled) Transfers: Sit to/from Stand Sit to Stand: Supervision         General transfer comment: vc initially for safe hand placement    Balance Overall balance assessment: Mild deficits observed, not formally tested                                         ADL  either performed or assessed with clinical judgement   ADL Overall ADL's : Needs assistance/impaired Eating/Feeding: Independent   Grooming: Supervision/safety;Standing Grooming Details (indicate cue type and reason): has a stool if he needs it Upper Body Bathing: Modified independent;Sitting   Lower Body Bathing: Set up;Sitting/lateral leans   Upper Body Dressing : Modified independent   Lower Body Dressing: Minimal assistance;Sit to/from stand   Toilet Transfer: Supervision/safety;Ambulation;RW   Toileting- Clothing Manipulation and Hygiene: Supervision/safety;Sit to/from stand   Tub/ Shower Transfer: Min guard;Ambulation;Grab bars Tub/Shower Transfer Details (indicate cue type and reason): talked about having wife present for safety Functional mobility during ADLs: Supervision/safety;Rolling walker General ADL Comments: Pt will have assist from wife for LB dressing- which will be his biggest challenge     Vision Baseline Vision/History: Wears glasses Wears Glasses: At all times Patient Visual Report: No change from baseline       Perception     Praxis      Pertinent Vitals/Pain Pain Assessment: No/denies pain Faces Pain Scale: No hurt Pain Intervention(s): Monitored during session;Repositioned     Hand Dominance     Extremity/Trunk Assessment Upper Extremity Assessment Upper Extremity Assessment: Overall WFL for tasks assessed   Lower Extremity Assessment Lower Extremity Assessment: Defer to PT evaluation   Cervical / Trunk Assessment Cervical / Trunk Assessment: Normal   Communication Communication Communication: No difficulties   Cognition Arousal/Alertness: Awake/alert Behavior During Therapy: [facility]  for tasks assessed/performed(very eager to go home-dry humor) Overall Cognitive Status: Within Functional Limits for tasks assessed                                     General Comments       Exercises     Shoulder Instructions       Home Living Family/patient expects to be discharged to:: Private residence Living Arrangements: Spouse/significant other Available Help at Discharge: Family;Available 24 hours/day Type of Home: House Home Access: Stairs to enter [employer] of Steps: 4 Entrance Stairs-Rails: Right;Left;Can reach both Home Layout: One level     Bathroom Shower/Tub: Teacher, early years/pre: Standard Bathroom Accessibility: Yes How Accessible: Accessible via walker Home Equipment: [place] - 2 wheels;Shower seat;Grab bars - tub/shower          Prior Functioning/Environment Level of Independence: Independent with assistive device(s)        Comments: using RW for mobility recently        OT Problem List: Decreased activity tolerance      OT Treatment/Interventions:      OT Goals(Current goals can be found in the care plan section) Acute Rehab OT Goals Patient Stated Goal: get home ASAP OT Goal Formulation: With patient Time For Goal Achievement: 01/23/19 Potential to Achieve Goals: Good  OT Frequency:     Barriers to D/C:            Co-evaluation              AM-PAC OT "6 Clicks" Daily Activity     Outcome Measure Help from another person eating meals?: None Help from another person taking care of personal grooming?: None Help from another person toileting, which includes using toliet, bedpan, or urinal?: None Help from another person bathing (including washing, rinsing, drying)?: A Little Help from another person to put on and taking off regular upper body clothing?: None Help from another person to put on and taking off regular lower body clothing?: A Little 6 Click Score: 22   End of Session Equipment Utilized During Treatment: Rolling walker Nurse Communication: Mobility status  Activity Tolerance: Patient tolerated treatment well Patient left: in chair;with call bell/phone within reach  OT Visit Diagnosis: Other abnormalities of gait and  mobility (R26.89)                Time: 2395-3202 OT Time Calculation (min): 26 min Charges:  OT General Charges $OT Visit: 1 Visit OT Evaluation $OT Eval Low Complexity: 1 Low OT Treatments $Self Care/Home Management : 8-22 mins  Hulda Humphrey OTR/L Acute Rehabilitation Services Pager: [phone] Office: [place] 01/09/2019, 10:11 AM

## 2019-01-09 NOTE — Anesthesia Postprocedure Evaluation (Signed)
Anesthesia Post Note  Patient: RAYMOND BHARDWAJ  Procedure(s) Performed: RIGHT FEMORAL Endartarectomy and profundoplasty using greater saphenous vein graft. Right anterior tibial artery bypass with nonreversed saphenous vein graft (Right Leg Lower) Lower Extremity Angiogram (Right Leg Lower)     Patient location during evaluation: PACU Anesthesia Type: General Level of consciousness: awake and alert and confused Pain management: pain level controlled Vital Signs Assessment: post-procedure vital signs reviewed and stable Respiratory status: spontaneous breathing, nonlabored ventilation, respiratory function stable and patient connected to nasal cannula oxygen Cardiovascular status: blood pressure returned to baseline and stable Postop Assessment: no apparent nausea or vomiting Anesthetic complications: no    Last Vitals:  Vitals:   01/09/19 0526 01/09/19 0835  BP: 130/68 (!) 109/55  Pulse: 67 74  Resp: 13 15  Temp: 36.4 C (!) 36.4 C  SpO2: 98% 98%    Last Pain:  Vitals:   01/09/19 0835  TempSrc: Oral  PainSc:                  Tiajuana Amass

## 2019-01-09 NOTE — Progress Notes (Addendum)
  Progress Note    01/09/2019 7:56 AM 1 Day Post-Op  Subjective:  Patient has already been OOB and walking.  He says R foot feels much better since surgery.  He is wanting to be discharged home today.   Vitals:   01/09/19 0252 01/09/19 0526  BP:  130/68  Pulse: 76 67  Resp: 17 13  Temp:  97.6 F (36.4 C)  SpO2: 93% 98%   Physical Exam: Lungs: Non labored Incisions: R groin and RLE incisions all c/d/i Extremities: R DP brisk by doppler; monophasic PT by doppler Abdomen:  Soft Neurologic: A&O  CBC    Component Value Date/Time   WBC 12.3 (H) 01/09/2019 0343   RBC 3.93 (L) 01/09/2019 0343   HGB 12.1 (L) 01/09/2019 0343   HCT 39.0 01/09/2019 0343   PLT 211 01/09/2019 0343   MCV 99.2 01/09/2019 0343   MCH 30.8 01/09/2019 0343   MCHC 31.0 01/09/2019 0343   RDW 13.4 01/09/2019 0343   LYMPHSABS 1.0 01/05/2019 1502   MONOABS 0.7 01/05/2019 1502   EOSABS 0.1 01/05/2019 1502   BASOSABS 0.1 01/05/2019 1502    BMET    Component Value Date/Time   NA 136 01/09/2019 0343   K 4.5 01/09/2019 0343   CL 101 01/09/2019 0343   CO2 27 01/09/2019 0343   GLUCOSE 127 (H) 01/09/2019 0343   BUN 10 01/09/2019 0343   CREATININE 0.94 01/09/2019 0343   CALCIUM 8.4 (L) 01/09/2019 0343   GFRNONAA >60 01/09/2019 0343   GFRAA >60 01/09/2019 0343    INR    Component Value Date/Time   INR 1.0 01/05/2019 1502     Intake/Output Summary (Last 24 hours) at 01/09/2019 0756 Last data filed at 01/09/2019 0700 Gross per 24 hour  Intake 6533.51 ml  Output 1950 ml  Net 4583.51 ml     Assessment/Plan:  68 y.o. male is s/p R femoral endarterectomy and femoral to ATA bypass with GSV 1 Day Post-Op   Patent bypass with brisk doppler signals R foot Possible d/c home this morning/afternoon Will discuss anticoagulation with attending  Dagoberto Ligas, PA-C [facility] [phone] 01/09/2019 7:56 AM  I have seen and evaluated the patient. I agree with the PA note as  documented above. POD#1 s/p fem AT bypass. Brisk R DP/AT signal.  Patient wants to go home today.  States pain controlled and has walked.  Low dose heparin overnight can be d/c'd prior to discharge.  Will need short interval follow-up in clinic.  Marty Heck, MD [facility] [place] Office: [phone] Pager: [phone]

## 2019-01-09 NOTE — Discharge Summary (Signed)
Physician Discharge Summary   Patient ID: Omar Singh [id number] 67 y.o. March 18, 1951  Admit date: 01/05/2019  Discharge date and time: 01/09/19   Admitting Physician: Elam Dutch, MD   Discharge Physician: Dr. Carlis Abbott  Admission Diagnoses: rest pain RLE  Discharge Diagnoses: PAD  Admission Condition: fair  Discharged Condition: fair  Indication for Admission: rest pain RLE  Hospital Course: [name] is a 68 year old male who presented to the emergency department with rest pain in his right foot related to peripheral arterial disease.  He was admitted to the hospital on IV heparin in preparation for revascularization.  He was taken to the [facility] lab by Dr. Trula Slade on 01/06/2019 and underwent abdominal aortogram with bilateral lower extremity runoff.  Left common and external iliac artery are found to be occluded however symptomatic extremity was the right leg.  Based on arteriogram with lengthy SFA blockage plan was for bypass surgery during hospitalization.  He was brought to the operating room by Dr. Scot Dock on 01/08/2019 and underwent right common femoral artery endarterectomy and femoral to anterior tibial artery bypass with greater saphenous vein.  He was kept on low-dose IV heparin postoperatively.  POD #1 patient is ambulating without difficulty, tolerating a regular diet, and feeling ready for discharge home.  He believes his rest pain has resolved completely and feels much better since bypass surgery.  He will need to continue his aspirin and statin daily.  He will follow-up with Dr. Scot Dock in office in about 2 to 3 weeks.  We will also need to monitor left lower extremity for symptoms of rest pain or tissue loss given that he has an iliac occlusion as well as diffuse SFA and tibial disease.  He will be prescribed 2 to 3 days of narcotic pain medication for continued postoperative pain control.  Discharge instructions were reviewed with the patient and he voices understanding.   He will be discharged home this morning in stable condition.  Consults: None  Treatments: surgery: Aortogram with bilateral lower extremity runoff by Dr. Trula Slade on 01/06/2019 Right common femoral artery endarterectomy and femoral to anterior tibial artery bypass with greater saphenous vein by Dr. Scot Dock on 01/08/2019  Discharge Exam: See progress note 01/09/2019 Vitals:   01/09/19 0526 01/09/19 0835  BP: 130/68 (!) 109/55  Pulse: 67 74  Resp: 13 15  Temp: 97.6 F (36.4 C) (!) 97.5 F (36.4 C)  SpO2: 98% 98%    Disposition: Discharge disposition: 01-Home or Self Care       - For [facility] use ---  Post-op:  Wound infection: No  Graft infection: No  Transfusion: No  New Arrhythmia: No Patency judged by: [ ]  Dopper only, [x ] Palpable graft pulse, [ ]  Palpable distal pulse, [ ]  ABI inc. > 0.15, [ ]  Duplex D/C Ambulatory Status: Ambulatory  Complications: MI: [x ] No, [ ]  Troponin only, [ ]  EKG or Clinical CHF: No Resp failure: [x ] none, [ ]  Pneumonia, [ ]  Ventilator Chg in renal function: [x ] none, [ ]  Inc. Cr > 0.5, [ ]  Temp. Dialysis, [ ]  Permanent dialysis Stroke: [x ] None, [ ]  Minor, [ ]  Major Return to OR: No  Reason for return to OR: [ ]  Bleeding, [ ]  Infection, [ ]  Thrombosis, [ ]  Revision  Discharge medications: Statin use:  Yes ASA use:  Yes Plavix use:  No  for medical reason not indicated Beta blocker use: Yes Coumadin use: No  for medical reason Not indicated  Patient Instructions:  Allergies as of 01/09/2019   No Known Allergies     Medication List    TAKE these medications   albuterol 108 (90 Base) MCG/ACT inhaler Commonly known as:  VENTOLIN HFA Inhale 2 puffs into the lungs every 6 (six) hours as needed for wheezing or shortness of breath.   aspirin 81 MG EC tablet Take 1 tablet (81 mg total) by mouth daily.   atorvastatin 40 MG tablet Commonly known as:  LIPITOR Take 1 tablet (40 mg total) by mouth daily at 6 PM.    diphenhydramine-acetaminophen 25-500 MG Tabs tablet Commonly known as:  TYLENOL PM Take 2 tablets by mouth at bedtime.   losartan 50 MG tablet Commonly known as:  COZAAR Take 1 tablet (50 mg total) by mouth daily.   metoprolol tartrate 25 MG tablet Commonly known as:  LOPRESSOR Take 1 tablet (25 mg total) by mouth 2 (two) times daily for 30 days.   mometasone-formoterol 200-5 MCG/ACT Aero Commonly known as:  DULERA Inhale 2 puffs into the lungs daily.   oxyCODONE 5 MG immediate release tablet Commonly known as:  Oxy IR/ROXICODONE Take 1 tablet (5 mg total) by mouth every 6 (six) hours as needed for moderate pain.   tiotropium 18 MCG inhalation capsule Commonly known as:  Spiriva HandiHaler Place 1 capsule (18 mcg total) into inhaler and inhale 2 (two) times daily.            Durable Medical Equipment  (From admission, onward)         Start     Ordered   01/09/19 1038  For home use only DME Walker rolling  Once    Question:  Patient needs a walker to treat with the following condition  Answer:  Weakness   01/09/19 1038         Activity: activity as tolerated Diet: regular diet Wound Care: keep wound clean and dry  Follow-up with Dr. Scot Dock in 2 weeks.  SignedDagoberto Ligas 01/09/2019 11:58 AM

## 2019-01-09 NOTE — TOC Transition Note (Signed)
Transition of Care [facility]) - CM/SW Discharge Note Marvetta Gibbons RN, BSN Transitions of Care Unit 4E- RN Case Manager [phone]  Patient Details  Name: Omar Singh MRN: [id number] Date of Birth: 1950/09/26  Transition [facility]) CM/SW Contact:  Dawayne Patricia, RN Phone Number: 01/09/2019, 11:38 AM   Clinical Narrative:    Pt admitted s/p femoral endarterectomy, has been cleared for transition home notified by PT that pt will need RW for home. Order placed and call made to Zack with [employer] for DME need- RW to be delivered to room prior to discharge.    Final next level of care: Home/Self Care Barriers to Discharge: No Barriers Identified   Patient Goals and CMS Choice Patient states their goals for this hospitalization and ongoing recovery are:: "to get home" CMS Medicare.gov Compare Post Acute Care list provided to:: Patient Choice offered to / list presented to : Patient  Discharge Placement  Home with wife-                      Discharge Plan and Services   Discharge Planning Services: CM Consult Post Acute Care Choice: Durable Medical Equipment          DME Arranged: Walker rolling DME Agency: [employer] Date DME Agency Contacted: 01/09/19 Time DME Agency Contacted: 1188 Representative spoke with at DME Agency: [place]: NA        Social Determinants of Health ([place]) Interventions     Readmission Risk Interventions Readmission Risk Prevention Plan 01/09/2019  Transportation Screening Complete  PCP or Specialist Appt within 5-7 Days Complete  Home Care Screening Complete  Medication Review (RN CM) Complete  Some recent data might be hidden

## 2019-01-10 ENCOUNTER — Encounter (HOSPITAL_COMMUNITY): Payer: Self-pay

## 2019-01-10 ENCOUNTER — Emergency Department (HOSPITAL_COMMUNITY): Payer: Medicare PPO

## 2019-01-10 ENCOUNTER — Inpatient Hospital Stay (HOSPITAL_COMMUNITY)
Admission: EM | Admit: 2019-01-10 | Discharge: 2019-01-13 | DRG: 190 | Disposition: A | Discharge status: Home / Self Care | Payer: Medicare PPO | Attending: Internal Medicine | Admitting: Internal Medicine

## 2019-01-10 ENCOUNTER — Other Ambulatory Visit: Payer: Self-pay

## 2019-01-10 DIAGNOSIS — Z955 Presence of coronary angioplasty implant and graft: Secondary | ICD-10-CM

## 2019-01-10 DIAGNOSIS — Z823 Family history of stroke: Secondary | ICD-10-CM

## 2019-01-10 DIAGNOSIS — R4182 Altered mental status, unspecified: Secondary | ICD-10-CM | POA: Diagnosis not present

## 2019-01-10 DIAGNOSIS — E785 Hyperlipidemia, unspecified: Secondary | ICD-10-CM | POA: Diagnosis present

## 2019-01-10 DIAGNOSIS — I252 Old myocardial infarction: Secondary | ICD-10-CM

## 2019-01-10 DIAGNOSIS — R509 Fever, unspecified: Secondary | ICD-10-CM | POA: Diagnosis not present

## 2019-01-10 DIAGNOSIS — R0902 Hypoxemia: Secondary | ICD-10-CM

## 2019-01-10 DIAGNOSIS — R05 Cough: Secondary | ICD-10-CM | POA: Diagnosis not present

## 2019-01-10 DIAGNOSIS — Z20828 Contact with and (suspected) exposure to other viral communicable diseases: Secondary | ICD-10-CM | POA: Diagnosis present

## 2019-01-10 DIAGNOSIS — I4510 Unspecified right bundle-branch block: Secondary | ICD-10-CM | POA: Diagnosis not present

## 2019-01-10 DIAGNOSIS — I2510 Atherosclerotic heart disease of native coronary artery without angina pectoris: Secondary | ICD-10-CM | POA: Diagnosis present

## 2019-01-10 DIAGNOSIS — F17210 Nicotine dependence, cigarettes, uncomplicated: Secondary | ICD-10-CM | POA: Diagnosis present

## 2019-01-10 DIAGNOSIS — Z79899 Other long term (current) drug therapy: Secondary | ICD-10-CM | POA: Diagnosis not present

## 2019-01-10 DIAGNOSIS — Z7951 Long term (current) use of inhaled steroids: Secondary | ICD-10-CM

## 2019-01-10 DIAGNOSIS — I10 Essential (primary) hypertension: Secondary | ICD-10-CM

## 2019-01-10 DIAGNOSIS — R0602 Shortness of breath: Secondary | ICD-10-CM | POA: Diagnosis not present

## 2019-01-10 DIAGNOSIS — R531 Weakness: Secondary | ICD-10-CM

## 2019-01-10 DIAGNOSIS — R911 Solitary pulmonary nodule: Secondary | ICD-10-CM | POA: Diagnosis present

## 2019-01-10 DIAGNOSIS — F1010 Alcohol abuse, uncomplicated: Secondary | ICD-10-CM | POA: Diagnosis not present

## 2019-01-10 DIAGNOSIS — J9601 Acute respiratory failure with hypoxia: Secondary | ICD-10-CM | POA: Diagnosis not present

## 2019-01-10 DIAGNOSIS — I480 Paroxysmal atrial fibrillation: Secondary | ICD-10-CM | POA: Diagnosis present

## 2019-01-10 DIAGNOSIS — G252 Other specified forms of tremor: Secondary | ICD-10-CM

## 2019-01-10 DIAGNOSIS — I739 Peripheral vascular disease, unspecified: Secondary | ICD-10-CM | POA: Diagnosis not present

## 2019-01-10 DIAGNOSIS — F410 Panic disorder [episodic paroxysmal anxiety] without agoraphobia: Secondary | ICD-10-CM | POA: Diagnosis present

## 2019-01-10 DIAGNOSIS — J441 Chronic obstructive pulmonary disease with (acute) exacerbation: Principal | ICD-10-CM | POA: Diagnosis present

## 2019-01-10 DIAGNOSIS — Z7982 Long term (current) use of aspirin: Secondary | ICD-10-CM | POA: Diagnosis not present

## 2019-01-10 DIAGNOSIS — R404 Transient alteration of awareness: Secondary | ICD-10-CM | POA: Diagnosis not present

## 2019-01-10 DIAGNOSIS — J449 Chronic obstructive pulmonary disease, unspecified: Secondary | ICD-10-CM | POA: Diagnosis present

## 2019-01-10 DIAGNOSIS — R410 Disorientation, unspecified: Secondary | ICD-10-CM | POA: Diagnosis not present

## 2019-01-10 LAB — COMPREHENSIVE METABOLIC PANEL
ALT: 16 U/L (ref 0–44)
AST: 26 U/L (ref 15–41)
Albumin: 2.8 g/dL — ABNORMAL LOW (ref 3.5–5.0)
Alkaline Phosphatase: 48 U/L (ref 38–126)
Anion gap: 10 (ref 5–15)
BUN: 18 mg/dL (ref 8–23)
CO2: 26 mmol/L (ref 22–32)
Calcium: 7.9 mg/dL — ABNORMAL LOW (ref 8.9–10.3)
Chloride: 100 mmol/L (ref 98–111)
Creatinine, Ser: 1.09 mg/dL (ref 0.61–1.24)
GFR calc Af Amer: >60 mL/min (ref >60)
GFR calc non Af Amer: >60 mL/min (ref >60)
Glucose, Bld: 115 mg/dL — ABNORMAL HIGH (ref 70–99)
Potassium: 3.9 mmol/L (ref 3.5–5.1)
Sodium: 136 mmol/L (ref 135–145)
Total Bilirubin: 0.8 mg/dL (ref 0.3–1.2)
Total Protein: 5.3 g/dL — ABNORMAL LOW (ref 6.5–8.1)

## 2019-01-10 LAB — CBC WITH DIFFERENTIAL/PLATELET
Abs Immature Granulocytes: 0.05 10*3/uL (ref 0.00–0.07)
Basophils Absolute: 0 10*3/uL (ref 0.0–0.1)
Basophils Relative: 0 %
Eosinophils Absolute: 0 10*3/uL (ref 0.0–0.5)
Eosinophils Relative: 0 %
HCT: 37.1 % — ABNORMAL LOW (ref 39.0–52.0)
Hemoglobin: 11.5 g/dL — ABNORMAL LOW (ref 13.0–17.0)
Immature Granulocytes: 0 %
Lymphocytes Relative: 12 %
Lymphs Abs: 1.3 10*3/uL (ref 0.7–4.0)
MCH: 31.5 pg (ref 26.0–34.0)
MCHC: 31 g/dL (ref 30.0–36.0)
MCV: 101.6 fL — ABNORMAL HIGH (ref 80.0–100.0)
Monocytes Absolute: 1.5 10*3/uL — ABNORMAL HIGH (ref 0.1–1.0)
Monocytes Relative: 14 %
Neutro Abs: 8.3 10*3/uL — ABNORMAL HIGH (ref 1.7–7.7)
Neutrophils Relative %: 74 %
Platelets: 183 10*3/uL (ref 150–400)
RBC: 3.65 MIL/uL — ABNORMAL LOW (ref 4.22–5.81)
RDW: 13.3 % (ref 11.5–15.5)
WBC: 11.3 10*3/uL — ABNORMAL HIGH (ref 4.0–10.5)
nRBC: 0 % (ref 0.0–0.2)

## 2019-01-10 LAB — URINALYSIS, ROUTINE W REFLEX MICROSCOPIC
Bacteria, UA: NONE SEEN
Bilirubin Urine: NEGATIVE
Glucose, UA: NEGATIVE mg/dL
Ketones, ur: NEGATIVE mg/dL
Leukocytes,Ua: NEGATIVE
Nitrite: NEGATIVE
Protein, ur: NEGATIVE mg/dL
Specific Gravity, Urine: 1.015 (ref 1.005–1.030)
pH: 5 (ref 5.0–8.0)

## 2019-01-10 LAB — TRIGLYCERIDES: Triglycerides: 93 mg/dL (ref <150)

## 2019-01-10 LAB — C-REACTIVE PROTEIN: CRP: 9.1 mg/dL — ABNORMAL HIGH (ref <1.0)

## 2019-01-10 LAB — LACTIC ACID, PLASMA: Lactic Acid, Venous: 1.2 mmol/L (ref 0.5–1.9)

## 2019-01-10 LAB — PROCALCITONIN: Procalcitonin: <0.1 ng/mL

## 2019-01-10 LAB — SARS CORONAVIRUS 2 BY RT PCR (HOSPITAL ORDER, PERFORMED IN ~~LOC~~ HOSPITAL LAB): SARS Coronavirus 2: NEGATIVE

## 2019-01-10 LAB — FERRITIN: Ferritin: 141 ng/mL (ref 24–336)

## 2019-01-10 LAB — LACTATE DEHYDROGENASE: LDH: 155 U/L (ref 98–192)

## 2019-01-10 LAB — TROPONIN I: Troponin I: 0.08 ng/mL (ref <0.03)

## 2019-01-10 LAB — FIBRINOGEN: Fibrinogen: 560 mg/dL — ABNORMAL HIGH (ref 210–475)

## 2019-01-10 LAB — INFLUENZA PANEL BY PCR (TYPE A & B)
Influenza A By PCR: NEGATIVE
Influenza B By PCR: NEGATIVE

## 2019-01-10 LAB — D-DIMER, QUANTITATIVE: D-Dimer, Quant: 1.1 ug/mL-FEU — ABNORMAL HIGH (ref 0.00–0.50)

## 2019-01-10 MED ORDER — ENOXAPARIN SODIUM 40 MG/0.4ML ~~LOC~~ SOLN
40.0000 mg | SUBCUTANEOUS | Status: DC
  Administered 2019-01-10 – 2019-01-12 (×3): 40 mg via SUBCUTANEOUS
  Filled 2019-01-10 (×3): qty 0.4

## 2019-01-10 MED ORDER — MOMETASONE FURO-FORMOTEROL FUM 200-5 MCG/ACT IN AERO
2.0000 | INHALATION_SPRAY | Freq: Every day | RESPIRATORY_TRACT | Status: DC
  Administered 2019-01-11 – 2019-01-13 (×3): 2 via RESPIRATORY_TRACT
  Filled 2019-01-10: qty 8.8

## 2019-01-10 MED ORDER — IOHEXOL 300 MG/ML  SOLN
75.0000 mL | Freq: Once | INTRAMUSCULAR | Status: AC | PRN
  Administered 2019-01-10: 16:00:00 75 mL via INTRAVENOUS

## 2019-01-10 MED ORDER — PIPERACILLIN-TAZOBACTAM 3.375 G IVPB 30 MIN
3.3750 g | Freq: Once | INTRAVENOUS | Status: AC
  Administered 2019-01-10: 13:00:00 3.375 g via INTRAVENOUS
  Filled 2019-01-10: qty 50

## 2019-01-10 MED ORDER — ATORVASTATIN CALCIUM 40 MG PO TABS
40.0000 mg | ORAL_TABLET | Freq: Every day | ORAL | Status: DC
  Administered 2019-01-11 – 2019-01-12 (×2): 40 mg via ORAL
  Filled 2019-01-10 (×2): qty 1

## 2019-01-10 MED ORDER — ASPIRIN EC 81 MG PO TBEC
81.0000 mg | DELAYED_RELEASE_TABLET | Freq: Every day | ORAL | Status: DC
  Administered 2019-01-11 – 2019-01-13 (×3): 81 mg via ORAL
  Filled 2019-01-10 (×3): qty 1

## 2019-01-10 MED ORDER — METHYLPREDNISOLONE SODIUM SUCC 125 MG IJ SOLR
60.0000 mg | Freq: Four times a day (QID) | INTRAMUSCULAR | Status: DC
  Administered 2019-01-10 – 2019-01-12 (×6): 60 mg via INTRAVENOUS
  Filled 2019-01-10 (×6): qty 2

## 2019-01-10 MED ORDER — VANCOMYCIN HCL IN DEXTROSE 1-5 GM/200ML-% IV SOLN
1000.0000 mg | Freq: Once | INTRAVENOUS | Status: AC
  Administered 2019-01-10: 13:00:00 1000 mg via INTRAVENOUS
  Filled 2019-01-10: qty 200

## 2019-01-10 MED ORDER — TIOTROPIUM BROMIDE MONOHYDRATE 18 MCG IN CAPS: 18.0000 ug | ORAL_CAPSULE | Freq: Two times a day (BID) | RESPIRATORY_TRACT | Status: DC

## 2019-01-10 MED ORDER — METOPROLOL TARTRATE 25 MG PO TABS
25.0000 mg | ORAL_TABLET | Freq: Two times a day (BID) | ORAL | Status: DC
  Administered 2019-01-10 – 2019-01-13 (×6): 25 mg via ORAL
  Filled 2019-01-10 (×6): qty 1

## 2019-01-10 MED ORDER — UMECLIDINIUM BROMIDE 62.5 MCG/INH IN AEPB
1.0000 | INHALATION_SPRAY | Freq: Every day | RESPIRATORY_TRACT | Status: DC
  Administered 2019-01-11 – 2019-01-13 (×3): 1 via RESPIRATORY_TRACT
  Filled 2019-01-10: qty 7

## 2019-01-10 MED ORDER — ALBUTEROL SULFATE HFA 108 (90 BASE) MCG/ACT IN AERS
2.0000 | INHALATION_SPRAY | Freq: Four times a day (QID) | RESPIRATORY_TRACT | Status: DC | PRN
  Filled 2019-01-10: qty 6.7

## 2019-01-10 MED ORDER — ALBUTEROL SULFATE HFA 108 (90 BASE) MCG/ACT IN AERS
4.0000 | INHALATION_SPRAY | Freq: Once | RESPIRATORY_TRACT | Status: AC
  Administered 2019-01-10: 17:00:00 4 via RESPIRATORY_TRACT
  Filled 2019-01-10: qty 6.7

## 2019-01-10 MED ORDER — IPRATROPIUM BROMIDE HFA 17 MCG/ACT IN AERS
2.0000 | INHALATION_SPRAY | Freq: Once | RESPIRATORY_TRACT | Status: AC
  Administered 2019-01-10: 18:00:00 2 via RESPIRATORY_TRACT
  Filled 2019-01-10: qty 12.9

## 2019-01-10 NOTE — ED Notes (Signed)
MD paged to report Trop results

## 2019-01-10 NOTE — ED Notes (Signed)
Patient transported to CT 

## 2019-01-10 NOTE — ED Provider Notes (Signed)
[place] EMERGENCY DEPARTMENT Provider Note   CSN: [id number] Arrival date & time: 01/10/19  1153    History   Chief Complaint No chief complaint on file.   HPI Omar Singh is a 68 y.o. male.     Patient is a 68 year old male with a history of COPD, prior alcohol abuse, peripheral arterial disease and hypertension who presents with fever and altered mental status.  He was recently admitted for arterial occlusion of the right leg.  He underwent right common femoral endarterectomy and femoropopliteal bypass surgery on April 23.  He was discharged home yesterday feeling much better.  Per EMS, he has had some increased redness of his foot and leg.  His wife is noted a fever at home with increased coughing and this morning he has been confused.  He is also had increased tremors.  No known vomiting.  No abdominal pain.  He was noted to be hypoxic on EMS arrival with an oxygen saturation of 80% on room air.  He was placed on nonrebreather mask.     Past Medical History:  Diagnosis Date  . Acute exacerbation of chronic obstructive pulmonary disease (COPD) ([place])    [name] 03/23/2015  . Alcohol abuse   . CAD (coronary artery disease)    a. s/p multi-link vision stent to the mid RCA in 2007 at [facility].  . Hyperlipidemia   . Hypertension   . Myocardial infarction [place]) 2008   [name] 03/23/2015  . PAD (peripheral artery disease) ([place])   . Panic attack   . Tobacco abuse     Patient Active Problem List   Diagnosis Date Noted  . PAD (peripheral artery disease) ([place])   . Coronary artery disease involving coronary bypass graft of native heart with angina pectoris ([place])   . Hypertensive urgency   . Nonadherence to medication   . Intentional underdosing of agent primarily affecting cardiovascular system by patient due to financial hardship   . Chest pain 12/05/2018  . PAF (paroxysmal atrial fibrillation) ([place]) 03/26/2015  . NSTEMI (non-ST elevated myocardial  infarction) ([place])   . Hypoxia 03/23/2015  . Shortness of breath 03/23/2015  . CAD S/P RCA BMS '08, new RCA BMS 03/25/15 03/23/2015  . Elevated troponin 03/23/2015  . Pulmonary nodule 03/23/2015  . Alcohol abuse 03/23/2015  . Tobacco abuse 03/23/2015  . Acute respiratory failure with hypoxemia ([place]) 03/23/2015  . COPD exacerbation ([place]) 03/23/2015    Past Surgical History:  Procedure Laterality Date  . CARDIAC CATHETERIZATION N/A 03/25/2015   Procedure: Left Heart Cath and Coronary Angiography;  Surgeon: Burnell Blanks, MD;  Location: [place] CV LAB;  Service: Cardiovascular;  Laterality: N/A;  . CARDIAC CATHETERIZATION N/A 03/25/2015   Procedure: Coronary Stent Intervention;  Surgeon: Burnell Blanks, MD;  Location: [place] CV LAB;  Service: Cardiovascular;  Laterality: N/A;  . CORONARY ANGIOPLASTY WITH STENT PLACEMENT  2008   "1"  . FEMORAL-TIBIAL BYPASS GRAFT Right 01/08/2019   Procedure: RIGHT FEMORAL Endartarectomy and profundoplasty using greater saphenous vein graft. Right anterior tibial artery bypass with nonreversed saphenous vein graft;  Surgeon: Angelia Mould, MD;  Location: [facility] OR;  Service: Vascular;  Laterality: Right;  . LOWER EXTREMITY ANGIOGRAM Right 01/08/2019   Procedure: Lower Extremity Angiogram;  Surgeon: Angelia Mould, MD;  Location: [place];  Service: Vascular;  Laterality: Right;  . LOWER EXTREMITY ANGIOGRAPHY N/A 01/06/2019   Procedure: LOWER EXTREMITY ANGIOGRAPHY;  Surgeon: Serafina Mitchell, MD;  Location: [place]  CV LAB;  Service: Cardiovascular;  Laterality: N/A;  . TONSILLECTOMY          Home Medications    Prior to Admission medications   Medication Sig Start Date End Date Taking? Authorizing Provider  aspirin EC 81 MG EC tablet Take 1 tablet (81 mg total) by mouth daily. 12/07/18  Yes Lockamy, Timothy, DO  atorvastatin (LIPITOR) 40 MG tablet Take 1 tablet (40 mg total) by mouth daily at 6 PM. 12/07/18  Yes Lockamy,  Timothy, DO  diphenhydramine-acetaminophen (TYLENOL PM) 25-500 MG TABS tablet Take 2 tablets by mouth at bedtime.   Yes [provider]  losartan (COZAAR) 50 MG tablet Take 1 tablet (50 mg total) by mouth daily. 12/07/18  Yes Lockamy, Timothy, DO  metoprolol tartrate (LOPRESSOR) 25 MG tablet Take 1 tablet (25 mg total) by mouth 2 (two) times daily for 30 days. 12/07/18 01/10/19 Yes Lockamy, Timothy, DO  oxyCODONE (OXY IR/ROXICODONE) 5 MG immediate release tablet Take 1 tablet (5 mg total) by mouth every 6 (six) hours as needed for moderate pain. 01/09/19  Yes Dagoberto Ligas, PA-C  albuterol (PROVENTIL HFA;VENTOLIN HFA) 108 (90 BASE) MCG/ACT inhaler Inhale 2 puffs into the lungs every 6 (six) hours as needed for wheezing or shortness of breath. Patient not taking: Reported on 01/06/2019 03/27/15   Nita Sells, MD  mometasone-formoterol [facility]) 200-5 MCG/ACT AERO Inhale 2 puffs into the lungs daily. Patient not taking: Reported on 01/06/2019 03/27/15   Nita Sells, MD  tiotropium (SPIRIVA HANDIHALER) 18 MCG inhalation capsule Place 1 capsule (18 mcg total) into inhaler and inhale 2 (two) times daily. Patient not taking: Reported on 01/06/2019 03/27/15   Nita Sells, MD    Family History Family History  Problem Relation Age of Onset  . CVA Other        Grandparents had strokes    Social History Social History   Tobacco Use  . Smoking status: Current Every Day Smoker    Packs/day: 0.25    Years: 47.00    Pack years: 11.75    Types: Cigarettes  . Smokeless tobacco: Never Used  Substance Use Topics  . Alcohol use: Not Currently    Alcohol/week: 56.0 standard drinks    Types: 56 Cans of beer per week    Comment: quit 1 year ago   . Drug use: No     Allergies   Patient has no known allergies.   Review of Systems Review of Systems  Unable to perform ROS: Mental status change     Physical Exam Updated Vital Signs BP (!) 154/96 (BP Location: Right  Arm)   Pulse 72   Temp 99.5 F (37.5 C) (Rectal)   Resp 20   Ht 5\' 9"  (1.753 m)   Wt 85.4 kg   SpO2 99%   BMI 27.80 kg/m   Physical Exam Constitutional:      Appearance: He is well-developed. He is ill-appearing.     Comments: Slightly shaky all over  HENT:     Head: Normocephalic and atraumatic.  Eyes:     Pupils: Pupils are equal, round, and reactive to light.  Neck:     Musculoskeletal: Normal range of motion and neck supple.  Cardiovascular:     Rate and Rhythm: Normal rate and regular rhythm.     Heart sounds: Normal heart sounds.  Pulmonary:     Effort: Pulmonary effort is normal. No respiratory distress.     Breath sounds: Wheezing and rhonchi present. No rales.  Chest:  Chest wall: No tenderness.  Abdominal:     General: Bowel sounds are normal.     Palpations: Abdomen is soft.     Tenderness: There is no abdominal tenderness. There is no guarding or rebound.  Musculoskeletal: Normal range of motion.     Comments: Patient has some erythema to the toes of his right foot and about halfway up his foot.  He has some redness along the incision lines of his right calf.  There is some mild swelling of the leg.  Mild warmth.  Dorsalis pedis pulses palpable.  Lymphadenopathy:     Cervical: No cervical adenopathy.  Skin:    General: Skin is warm and dry.     Findings: No rash.  Neurological:     Mental Status: He is alert and oriented to person, place, and time.      ED Treatments / Results  Labs (all labs ordered are listed, but only abnormal results are displayed) Labs Reviewed  CBC WITH DIFFERENTIAL/PLATELET - Abnormal; Notable for the following components:      Result Value   WBC 11.3 (*)    RBC 3.65 (*)    Hemoglobin 11.5 (*)    HCT 37.1 (*)    MCV 101.6 (*)    Neutro Abs 8.3 (*)    Monocytes Absolute 1.5 (*)    All other components within normal limits  COMPREHENSIVE METABOLIC PANEL - Abnormal; Notable for the following components:   Glucose, Bld  115 (*)    Calcium 7.9 (*)    Total Protein 5.3 (*)    Albumin 2.8 (*)    All other components within normal limits  D-DIMER, QUANTITATIVE (NOT AT [facility]) - Abnormal; Notable for the following components:   D-Dimer, Quant 1.10 (*)    All other components within normal limits  FIBRINOGEN - Abnormal; Notable for the following components:   Fibrinogen 560 (*)    All other components within normal limits  C-REACTIVE PROTEIN - Abnormal; Notable for the following components:   CRP 9.1 (*)    All other components within normal limits  SARS CORONAVIRUS 2 (HOSPITAL ORDER, [place] [facility])  CULTURE, BLOOD (ROUTINE X 2)  CULTURE, BLOOD (ROUTINE X 2)  LACTIC ACID, PLASMA  PROCALCITONIN  LACTATE DEHYDROGENASE  FERRITIN  TRIGLYCERIDES  LACTIC ACID, PLASMA  URINALYSIS, ROUTINE W REFLEX MICROSCOPIC    EKG EKG Interpretation  Date/Time:  Saturday January 10 2019 12:26:17 EDT Ventricular Rate:  77 PR Interval:    QRS Duration: 148 QT Interval:  415 QTC Calculation: 470 R Axis:   -85 Text Interpretation:  Sinus rhythm Consider left atrial enlargement RBBB and LAFB since last tracing no significant change Confirmed by Malvin Johns [phone]) on 01/10/2019 1:20:49 PM   Radiology Dg Chest Port 1 View  Result Date: 01/10/2019 CLINICAL DATA:  Read toes.  Fever and cough. EXAM: PORTABLE CHEST 1 VIEW COMPARISON:  12/05/2018 FINDINGS: Normal heart size. Lungs clear. No pneumothorax. No pleural effusion. IMPRESSION: No active disease. Electronically Signed   By: Marybelle Killings M.D.   On: 01/10/2019 13:04    Procedures Procedures (including critical care time)  Medications Ordered in ED Medications  piperacillin-tazobactam (ZOSYN) IVPB 3.375 g (0 g Intravenous Stopped 01/10/19 1457)  vancomycin (VANCOCIN) IVPB 1000 mg/200 mL premix (0 mg Intravenous Stopped 01/10/19 1436)  iohexol (OMNIPAQUE) 300 MG/ML solution 75 mL (75 mLs Intravenous Contrast Given 01/10/19 1534)     Initial  Impression / Assessment and Plan / ED Course  I have reviewed the triage vital signs and the nursing notes.  Pertinent labs & imaging results that were available during my care of the patient were reviewed by me and considered in my medical decision making (see chart for details).        Patient is 68 year old male who presents with fever and hypoxia.  He was febrile per EMS report.  He was requiring a nonrebreather oxygen mask although this has been transitioned to a nasal cannula and he is maintaining good oxygen saturations on a nasal cannula.  His chest x-ray is clear without evidence of pneumonia.  His extremity where he recently had the surgery is erythematous although vascular surgery has evaluated the patient and feels that it looks okay and is likely not the source of infection.  His d-dimer is elevated and he is awaiting a CT scan of his chest to rule out PE or occult infection.  Urinalysis is also pending.  His COVID testing is negative.  His wife has been updated.  Dr. Ashok Cordia taking over pending CT.  Final Clinical Impressions(s) / ED Diagnoses   Final diagnoses:  None    ED Discharge Orders    None       Malvin Johns, MD 01/10/19 [phone]

## 2019-01-10 NOTE — ED Triage Notes (Signed)
Pt from home via ems; recent sx 2 days ago for vascular bypass; developed redness in R toes yesterday; periods of ams, tremors, fever, cough that began yesterday; pt unable to ambulate; initial sats 80% w/ ems; pt endorses sob; 99.8 temporal; normally A and O and ambulatory per pt's wife  140/80 hx htn HR 82 sinus 98-100% NRB

## 2019-01-10 NOTE — H&P (Signed)
History and Physical    [name] TWS:[id number] DOB: [birth year] DOA: 01/10/2019  PCP: Patient, No Pcp Per Patient coming from: Home Chief Complaint: Shortness of breath  HPI: Omar Singh is a 68 y.o. male with medical history significant of COPD, CAD, alcohol abuse last drink was a year ago, tobacco abuse continues to smoke paroxysmal atrial fibrillation, peripheral vascular disease status post right lower extremity revascularization right femoral endarterectomy 01/08/2019 by Dr. Doren Custard.  He was discharged home 01/09/2019.  He is admitted with complaints of generalized weakness and hypoxia shortness of breath and cough.  Patient does not have oxygen at home.  His saturation was in the 80s in the emergency room.  He had complains of subjective fever with generalized tremor starting last night.  He denies any nausea vomiting abdominal pain diarrhea chest pain headaches.  He is awake alert and oriented.  He reported that approximately 3 years ago he had shaking like this and he had come to the hospital and was told he probably have seizures and was sent home on the same day.  In ed received albuterol, Vanco and Zosyn.  CT angiogram negative for PE extensive coronary calcification no evidence of consolidation or effusion or mass.  Labs sodium 136 potassium 3.9 BUN 18 creatinine 1.09.  Normal LFTs.  LDH 155 ferritin 141 CRP 9.1 lactic acid 1.2 procalcitonin less than 2.10.  White count 11.3 hemoglobin 11.5 platelet count 183, d-dimer 1.10.  He was 80% on room air upon arrival to ER he was placed on a nonrebreather mask.   Review of Systems: As per HPI otherwise all other systems reviewed and are negative Ambulatory Status: Ambulatory at baseline  Past Medical History:  Diagnosis Date  . Acute exacerbation of chronic obstructive pulmonary disease (COPD) ([place])    [name] 03/23/2015  . Alcohol abuse   . CAD (coronary artery disease)    a. s/p multi-link vision stent to the mid RCA in 2007 at [facility].  . Hyperlipidemia   . Hypertension   . Myocardial infarction [facility]) 2008   [name] 03/23/2015  . PAD (peripheral artery disease) ([place])   . Panic attack   . Tobacco abuse     Past Surgical History:  Procedure Laterality Date  . CARDIAC CATHETERIZATION N/A 03/25/2015   Procedure: Left Heart Cath and Coronary Angiography;  Surgeon: Burnell Blanks, MD;  Location: [place] [facility];  Service: Cardiovascular;  Laterality: N/A;  . CARDIAC CATHETERIZATION N/A 03/25/2015   Procedure: Coronary Stent Intervention;  Surgeon: Burnell Blanks, MD;  Location: [place] [facility];  Service: Cardiovascular;  Laterality: N/A;  . CORONARY ANGIOPLASTY WITH STENT PLACEMENT  2008   "1"  . FEMORAL-TIBIAL BYPASS GRAFT Right 01/08/2019   Procedure: RIGHT FEMORAL Endartarectomy and profundoplasty using greater saphenous vein graft. Right anterior tibial artery bypass with nonreversed saphenous vein graft;  Surgeon: Angelia Mould, MD;  Location: [facility] OR;  Service: Vascular;  Laterality: Right;  . LOWER EXTREMITY ANGIOGRAM Right 01/08/2019   Procedure: Lower Extremity Angiogram;  Surgeon: Angelia Mould, MD;  Location: [place];  Service: Vascular;  Laterality: Right;  . LOWER EXTREMITY ANGIOGRAPHY N/A 01/06/2019   Procedure: LOWER EXTREMITY ANGIOGRAPHY;  Surgeon: Serafina Mitchell, MD;  Location: [place] [facility];  Service: Cardiovascular;  Laterality: N/A;  . TONSILLECTOMY      Social History   Socioeconomic History  . Marital status: Married    Spouse name: Not on file  . Number of children: Not  on file  . Years of education: Not on file  . Highest education level: Not on file  Occupational History  . Not on file  Social Needs  . Financial resource strain: Not on file  . Food insecurity:    Worry: Not on file    Inability: Not on file  . Transportation needs:    Medical: Not on file    Non-medical: Not on file  Tobacco Use  . Smoking status: Current Every Day Smoker     Packs/day: 0.25    Years: 47.00    Pack years: 11.75    Types: Cigarettes  . Smokeless tobacco: Never Used  Substance and Sexual Activity  . Alcohol use: Not Currently    Alcohol/week: 56.0 standard drinks    Types: 56 Cans of beer per week    Comment: quit 1 year ago   . Drug use: No  . Sexual activity: Not Currently  Lifestyle  . Physical activity:    Days per week: Not on file    Minutes per session: Not on file  . Stress: Not on file  Relationships  . Social connections:    Talks on phone: Not on file    Gets together: Not on file    Attends religious service: Not on file    Active member of club or organization: Not on file    Attends meetings of clubs or organizations: Not on file    Relationship status: Not on file  . Intimate partner violence:    Fear of current or ex partner: Not on file    Emotionally abused: Not on file    Physically abused: Not on file    Forced sexual activity: Not on file  Other Topics Concern  . Not on file  Social History Narrative  . Not on file    No Known Allergies  Family History  Problem Relation Age of Onset  . CVA Other        Grandparents had strokes    Prior to Admission medications   Medication Sig Start Date End Date Taking? Authorizing Provider  aspirin EC 81 MG EC tablet Take 1 tablet (81 mg total) by mouth daily. 12/07/18  Yes Lockamy, Timothy, DO  atorvastatin (LIPITOR) 40 MG tablet Take 1 tablet (40 mg total) by mouth daily at 6 PM. 12/07/18  Yes Lockamy, Timothy, DO  diphenhydramine-acetaminophen (TYLENOL PM) 25-500 MG TABS tablet Take 2 tablets by mouth at bedtime.   Yes [provider]  losartan (COZAAR) 50 MG tablet Take 1 tablet (50 mg total) by mouth daily. 12/07/18  Yes Lockamy, Timothy, DO  metoprolol tartrate (LOPRESSOR) 25 MG tablet Take 1 tablet (25 mg total) by mouth 2 (two) times daily for 30 days. 12/07/18 01/10/19 Yes Lockamy, Timothy, DO  oxyCODONE (OXY IR/ROXICODONE) 5 MG immediate release tablet  Take 1 tablet (5 mg total) by mouth every 6 (six) hours as needed for moderate pain. 01/09/19  Yes Dagoberto Ligas, PA-C  albuterol (PROVENTIL HFA;VENTOLIN HFA) 108 (90 BASE) MCG/ACT inhaler Inhale 2 puffs into the lungs every 6 (six) hours as needed for wheezing or shortness of breath. Patient not taking: Reported on 01/06/2019 03/27/15   Nita Sells, MD  mometasone-formoterol [facility]) 200-5 MCG/ACT AERO Inhale 2 puffs into the lungs daily. Patient not taking: Reported on 01/06/2019 03/27/15   Nita Sells, MD  tiotropium (SPIRIVA HANDIHALER) 18 MCG inhalation capsule Place 1 capsule (18 mcg total) into inhaler and inhale 2 (two) times daily. Patient not taking:  Reported on 01/06/2019 03/27/15   Nita Sells, MD    Physical Exam: Vitals:   01/10/19 1554 01/10/19 1600 01/10/19 1645 01/10/19 1733  BP: (!) 154/96 (!) 167/77 (!) 116/53 (!) 156/91  Pulse: 72 72 66 73  Resp: 20 (!) 25 (!) 21 14  Temp:      TempSrc:      SpO2: 99% 100% 100% 98%  Weight:      Height:         . General:  Appears trembling shivering . Eyes:  PERRL, EOMI, normal lids, iris . ENT:  grossly normal hearing, lips & tongue, mmm . Neck:  no LAD, masses or thyromegaly . Cardiovascular:  RRR, no m/r/g. No LE edema.  Marland Kitchen Respiratory: Wheezing bilaterally, no w/r/r. Normal respiratory effort. . Abdomen:  soft, ntnd, NABS . Skin: no rash or induration seen on limited exam . Musculoskeletal: Right lower extremity incisions clean dry and intact . Psychiatric: grossly normal mood and affect, speech fluent and appropriate, AOx3 . Neurologic:  CN 2-12 grossly intact, moves all extremities in coordinated fashion, sensation intact  Labs on Admission: I have personally reviewed following labs and imaging studies  CBC: Recent Labs  Lab 01/05/19 1502 01/06/19 0342 01/07/19 0522 01/08/19 0255 01/09/19 0343 01/10/19 1230  WBC 8.2 9.3 9.7 9.9 12.3* 11.3*  NEUTROABS 6.3  --   --   --   --  8.3*  HGB  14.9 14.1 13.8 14.0 12.1* 11.5*  HCT 47.1 43.8 42.6 43.6 39.0 37.1*  MCV 99.4 97.1 96.6 96.7 99.2 101.6*  PLT 249 226 239 227 211 947   Basic Metabolic Panel: Recent Labs  Lab 01/05/19 1502 01/06/19 0342 01/08/19 0255 01/09/19 0343 01/10/19 1230  NA 135 137 137 136 136  K 4.3 4.0 4.0 4.5 3.9  CL 94* 97* 100 101 100  CO2 35* 30 27 27 26   GLUCOSE 84 115* 110* 127* 115*  BUN 8 9 9 10 18   CREATININE 0.99 0.92 0.89 0.94 1.09  CALCIUM 9.2 9.0 8.9 8.4* 7.9*   GFR: Estimated Creatinine Clearance: 72.2 mL/min (by C-G formula based on SCr of 1.09 mg/dL). Liver Function Tests: Recent Labs  Lab 01/06/19 0342 01/10/19 1230  AST 24 26  ALT 16 16  ALKPHOS 56 48  BILITOT 0.9 0.8  PROT 6.1* 5.3*  ALBUMIN 3.4* 2.8*   No results for input(s): LIPASE, AMYLASE in the last 168 hours. No results for input(s): AMMONIA in the last 168 hours. Coagulation Profile: Recent Labs  Lab 01/05/19 1502  INR 1.0   Cardiac Enzymes: No results for input(s): CKTOTAL, CKMB, CKMBINDEX, TROPONINI in the last 168 hours. BNP (last 3 results) No results for input(s): PROBNP in the last 8760 hours. HbA1C: No results for input(s): HGBA1C in the last 72 hours. CBG: No results for input(s): GLUCAP in the last 168 hours. Lipid Profile: Recent Labs    01/10/19 1230  TRIG 93   Thyroid Function Tests: No results for input(s): TSH, T4TOTAL, FREET4, T3FREE, THYROIDAB in the last 72 hours. Anemia Panel: Recent Labs    01/10/19 1230  FERRITIN 141   Urine analysis:    Component Value Date/Time   COLORURINE YELLOW 01/10/2019 [place] 01/10/2019 1608   LABSPEC 1.015 01/10/2019 1608   PHURINE 5.0 01/10/2019 1608   GLUCOSEU NEGATIVE 01/10/2019 1608   HGBUR SMALL (A) 01/10/2019 1608   BILIRUBINUR NEGATIVE 01/10/2019 1608   KETONESUR NEGATIVE 01/10/2019 1608   PROTEINUR NEGATIVE 01/10/2019 1608   NITRITE  NEGATIVE 01/10/2019 1608   LEUKOCYTESUR NEGATIVE 01/10/2019 1608    Creatinine  Clearance: Estimated Creatinine Clearance: 72.2 mL/min (by C-G formula based on SCr of 1.09 mg/dL).  Sepsis Labs: @LABRCNTIP (procalcitonin:4,lacticidven:4) ) Recent Results (from the past 240 hour(s))  MRSA PCR Screening     Status: None   Collection Time: 01/06/19  2:14 AM  Result Value Ref Range Status   MRSA by PCR NEGATIVE NEGATIVE Final    Comment:        The GeneXpert MRSA Assay (FDA approved for NASAL specimens only), is one component of a comprehensive MRSA colonization surveillance program. It is not intended to diagnose MRSA infection nor to guide or monitor treatment for MRSA infections. Performed at [facility], [place] [address] [place], [place] [address]   Surgical pcr screen     Status: None   Collection Time: 01/08/19 10:40 AM  Result Value Ref Range Status   MRSA, PCR NEGATIVE NEGATIVE Final   Staphylococcus aureus NEGATIVE NEGATIVE Final    Comment: (NOTE) The Xpert SA Assay (FDA approved for NASAL specimens in patients 77 years of age and older), is one component of a comprehensive surveillance program. It is not intended to diagnose infection nor to guide or monitor treatment. Performed at [facility], [place] [address] [place], [place] [address]   SARS Coronavirus 2 [facility] order, Performed in [facility] hospital lab)     Status: None   Collection Time: 01/10/19 12:30 PM  Result Value Ref Range Status   SARS Coronavirus 2 NEGATIVE NEGATIVE Final    Comment: (NOTE) If result is NEGATIVE SARS-CoV-2 target nucleic acids are NOT DETECTED. The SARS-CoV-2 RNA is generally detectable in upper and lower  respiratory specimens during the acute phase of infection. The lowest  concentration of SARS-CoV-2 viral copies this assay can detect is 250  copies / mL. A negative result does not preclude SARS-CoV-2 infection  and should not be used as the sole basis for treatment or other  patient management decisions.  A negative result may occur with   improper specimen collection / handling, submission of specimen other  than nasopharyngeal swab, presence of viral mutation(s) within the  areas targeted by this assay, and inadequate number of viral copies  (<250 copies / mL). A negative result must be combined with clinical  observations, patient history, and epidemiological information. If result is POSITIVE SARS-CoV-2 target nucleic acids are DETECTED. The SARS-CoV-2 RNA is generally detectable in upper and lower  respiratory specimens dur ing the acute phase of infection.  Positive  results are indicative of active infection with SARS-CoV-2.  Clinical  correlation with patient history and other diagnostic information is  necessary to determine patient infection status.  Positive results do  not rule out bacterial infection or co-infection with other viruses. If result is PRESUMPTIVE POSTIVE SARS-CoV-2 nucleic acids MAY BE PRESENT.   A presumptive positive result was obtained on the submitted specimen  and confirmed on repeat testing.  While 2019 novel coronavirus  (SARS-CoV-2) nucleic acids may be present in the submitted sample  additional confirmatory testing may be necessary for epidemiological  and / or clinical management purposes  to differentiate between  SARS-CoV-2 and other Sarbecovirus currently known to infect humans.  If clinically indicated additional testing with an alternate test  methodology [id number]) is advised. The SARS-CoV-2 RNA is generally  detectable in upper and lower respiratory sp ecimens during the acute  phase of infection. The expected result is Negative. Fact Sheet  for Patients:  StrictlyIdeas.no Fact Sheet for Healthcare Providers: BankingDealers.co.za This test is not yet approved or cleared by the [place] FDA and has been authorized for detection and/or diagnosis of SARS-CoV-2 by FDA under an Emergency Use Authorization (EUA).  This EUA will  remain in effect (meaning this test can be used) for the duration of the COVID-19 declaration under Section 564(b)(1) of the Act, 21 U.S.C. section 360bbb-3(b)(1), unless the authorization is terminated or revoked sooner. Performed at [facility], [place] [address] [place], [place] [address]      Radiological Exams on Admission: Ct Angio Chest Pe W/cm &/or Wo Cm  Result Date: 01/10/2019 CLINICAL DATA:  Fever and cough. Suspect pulmonary embolism. Recent femoral tibial bypass graft 01/08/2019 EXAM: CT ANGIOGRAPHY CHEST WITH CONTRAST TECHNIQUE: Multidetector CT imaging of the chest was performed using the standard protocol during bolus administration of intravenous contrast. Multiplanar CT image reconstructions and MIPs were obtained to evaluate the vascular anatomy. CONTRAST:  54mL OMNIPAQUE IOHEXOL 300 MG/ML  SOLN COMPARISON:  Chest 01/10/2019 FINDINGS: Cardiovascular: Negative for pulmonary embolism. Pulmonary arteries normal in caliber with normal enhancement. Mild atherosclerotic disease in the aortic arch without aneurysm or dissection. Ascending aorta 39 mm in diameter. Moderate to extensive coronary artery calcification. Heart size normal without pericardial effusion Mediastinum/Nodes: Negative for mass or adenopathy Lungs/Pleura: Mild dependent atelectasis in the right posterior lung base. Negative for pneumonia or effusion. No mass or lung nodule identified. Upper Abdomen: Layering density in the gallbladder may be vicarious excretion of contrast from prior angiogram on 01/08/2019. Small adrenal adenoma on the left 15 mm. Musculoskeletal: No acute abnormality. Review of the MIP images confirms the above findings. IMPRESSION: Negative for pulmonary embolism.  No acute abnormality in the chest. Atherosclerotic aorta. Moderate to extensive coronary calcification. Electronically Signed   By: Franchot Gallo M.D.   On: 01/10/2019 16:32   Dg Chest Port 1 View  Result Date: 01/10/2019 CLINICAL  DATA:  Read toes.  Fever and cough. EXAM: PORTABLE CHEST 1 VIEW COMPARISON:  12/05/2018 FINDINGS: Normal heart size. Lungs clear. No pneumothorax. No pleural effusion. IMPRESSION: No active disease. Electronically Signed   By: Marybelle Killings M.D.   On: 01/10/2019 13:04    EKG: Independently reviewed.   Assessment/Plan Active Problems:   * No active hospital problems. *    #1 acute hypoxic respiratory failure patient with recent right lower extremity bypass surgery and history of COPD patient admitted with shortness of breath cough subjective fever.  CT of the chest does not show any evidence of PE or pneumonia or pleural effusion.  Patient has been wheezing.  I will treat him with steroids inhalers COVID-19 negative COVID-19 ruled out.  Check flu panel RSV panel.  Obtain blood cultures.  Continue Spiriva Dulera and Ventolin.  #2 new onset tremor since last night patient presented with course tremors of extremities and his head.  He is awake alert oriented.  No seizure history documented though he thinks he had a history of seizure.  He does not know much more than that.  Review of his records does not show any evidence of seizures.  His tremors get worse with hypoxia.  Will follow for now and monitor hold off on ordering an EEG at this time.  #3 hypertension continue metoprolol hold Cozaar his creatinine is slightly higher than his baseline.  #4 PVD status post right lower extremity revascularization discharged from the hospital for 24 2020.  By Dr. Scot Dock.    Estimated  body mass index is 27.8 kg/m as calculated from the following:   Height as of this encounter: 5\' 9"  (1.753 m).   Weight as of this encounter: 85.4 kg.   DVT prophylaxis: Lovenox Code Status: Full code Family Communication: None Disposition Plan: Pending clinical improvement Consults called: None Admission status: Inpatient   Georgette Shell MD [facility]  If 7PM-7AM, please contact night-coverage  [email or web address] Password [facility]  01/10/2019, 6:11 PM

## 2019-01-10 NOTE — ED Notes (Signed)
Nurse drawing labs. 

## 2019-01-10 NOTE — Progress Notes (Signed)
Pharmacy Antibiotic Note  Omar Singh is a 68 y.o. male admitted on 01/10/2019 with cellulitis.  Pharmacy has been consulted for vancomycin and Zosyn dosing. Patient is s/p right common femoral artery endarterectomy and femoral to anterior tibial artery bypass on 4/23. He presented to the ED with increased redness of the foot and leg, cough, tremors, and subjective fever.   Plan: Zosyn 3.375g IV q8h (4 hour infusion).  Vancomycin 1,000 mg IV x 1 given in ED Vancomycin 2,000 mg IV Q 24 hrs. Goal AUC 400-550. Expected AUC: 494.5 SCr used: 1.09 Monitor cultures, clinical status, and vancomycin levels as needed  Height: 5\' 11"  (180.3 cm) Weight: 195 lb 8.8 oz (88.7 kg) IBW/kg (Calculated) : 75.3  Temp (24hrs), Avg:99 F (37.2 C), Min:98.2 F (36.8 C), Max:99.5 F (37.5 C)  Recent Labs  Lab 01/05/19 1502 01/06/19 0342 01/07/19 0522 01/08/19 0255 01/09/19 0343 01/10/19 1230  WBC 8.2 9.3 9.7 9.9 12.3* 11.3*  CREATININE 0.99 0.92  --  0.89 0.94 1.09  LATICACIDVEN  --   --   --   --   --  1.2    Estimated Creatinine Clearance: 71 mL/min (by C-G formula based on SCr of 1.09 mg/dL).    No Known Allergies  Antimicrobials this admission: Vancomyin 4/25 >>  Zosyn 4/25 >>   Dose adjustments this admission: None  Microbiology results: 4/25 BCx: sent 4/25 COVID 19: negative  Thank you for allowing pharmacy to be a part of this patient's care.  Vertis Kelch, PharmD PGY1 Pharmacy Resident Phone [phone] 01/10/2019       11:56 PM

## 2019-01-10 NOTE — Consult Note (Signed)
Hospital Consult    Reason for Consult:  Hypoxia, SOB, Tremors, fevers, s/p RLE revascularization Referring Physician:  ED MRN #:  [id number]  History of Present Illness: This is a 68 y.o. male with history of coronary artery disease, hypertension, hyperlipidemia, COPD, peripheral vascular disease that presented to the ED one day after discharge with reported hypoxia, shortness of breath, tremors and fever at home.  Patient underwent a right femoral endarterectomy with femoral to AT bypass with saphenous vein on 01/08/2019 by Dr. Scot Dock.  He did very well postop and was discharged home yesterday given that he was very adamant about leaving the hospital and was up walking and independent and cleared PT/OT therapy.  His wife called this morning on the answering line and I spoke to her and she was concerned that he had a stroke.  She stated that he was very weak and could not walk to the bathroom.  I advised her to call 911 and bring him to the emergency room of immediate evaluation.  Upon arrival it was noted that the patient was hypoxic on EMS arrival with sats in the 80s and required nonrebreather.  He also developed a cough and fever at home with a generalized tremor.  On my evaluation in the ED he is now more alert and oriented to the fact that he is at [facility]  He states his right foot feels much better after revascularization.  He is able to wiggle his toes and has no sensorimotor deficit in the right foot.  He does not remember the events from this morning.  Past Medical History:  Diagnosis Date  . Acute exacerbation of chronic obstructive pulmonary disease (COPD) ([place])    [name] 03/23/2015  . Alcohol abuse   . CAD (coronary artery disease)    a. s/p multi-link vision stent to the mid RCA in 2007 at [facility].  . Hyperlipidemia   . Hypertension   . Myocardial infarction [facility]) 2008   [name] 03/23/2015  . PAD (peripheral artery disease) ([place])   . Panic attack   . Tobacco abuse      Past Surgical History:  Procedure Laterality Date  . CARDIAC CATHETERIZATION N/A 03/25/2015   Procedure: Left Heart Cath and Coronary Angiography;  Surgeon: Burnell Blanks, MD;  Location: [place] [facility];  Service: Cardiovascular;  Laterality: N/A;  . CARDIAC CATHETERIZATION N/A 03/25/2015   Procedure: Coronary Stent Intervention;  Surgeon: Burnell Blanks, MD;  Location: [place] [facility];  Service: Cardiovascular;  Laterality: N/A;  . CORONARY ANGIOPLASTY WITH STENT PLACEMENT  2008   "1"  . FEMORAL-TIBIAL BYPASS GRAFT Right 01/08/2019   Procedure: RIGHT FEMORAL Endartarectomy and profundoplasty using greater saphenous vein graft. Right anterior tibial artery bypass with nonreversed saphenous vein graft;  Surgeon: Angelia Mould, MD;  Location[facility] OR;  Service: Vascular;  Laterality: Right;  . LOWER EXTREMITY ANGIOGRAM Right 01/08/2019   Procedure: Lower Extremity Angiogram;  Surgeon: Angelia Mould, MD;  Location: [place];  Service: Vascular;  Laterality: Right;  . LOWER EXTREMITY ANGIOGRAPHY N/A 01/06/2019   Procedure: LOWER EXTREMITY ANGIOGRAPHY;  Surgeon: Serafina Mitchell, MD;  Location: [place] [facility];  Service: Cardiovascular;  Laterality: N/A;  . TONSILLECTOMY      No Known Allergies  Prior to Admission medications   Medication Sig Start Date End Date Taking? Authorizing Provider  aspirin EC 81 MG EC tablet Take 1 tablet (81 mg total) by mouth daily. 12/07/18  Yes Nuala Alpha, DO  atorvastatin (LIPITOR) 40 MG tablet Take 1 tablet (40 mg total) by mouth daily at 6 PM. 12/07/18  Yes Lockamy, Timothy, DO  diphenhydramine-acetaminophen (TYLENOL PM) 25-500 MG TABS tablet Take 2 tablets by mouth at bedtime.   Yes [provider]  losartan (COZAAR) 50 MG tablet Take 1 tablet (50 mg total) by mouth daily. 12/07/18  Yes Lockamy, Timothy, DO  metoprolol tartrate (LOPRESSOR) 25 MG tablet Take 1 tablet (25 mg total) by mouth 2 (two) times daily for  30 days. 12/07/18 01/10/19 Yes Lockamy, Timothy, DO  oxyCODONE (OXY IR/ROXICODONE) 5 MG immediate release tablet Take 1 tablet (5 mg total) by mouth every 6 (six) hours as needed for moderate pain. 01/09/19  Yes Dagoberto Ligas, PA-C  albuterol (PROVENTIL HFA;VENTOLIN HFA) 108 (90 BASE) MCG/ACT inhaler Inhale 2 puffs into the lungs every 6 (six) hours as needed for wheezing or shortness of breath. Patient not taking: Reported on 01/06/2019 03/27/15   Nita Sells, MD  mometasone-formoterol [facility]) 200-5 MCG/ACT AERO Inhale 2 puffs into the lungs daily. Patient not taking: Reported on 01/06/2019 03/27/15   Nita Sells, MD  tiotropium (SPIRIVA HANDIHALER) 18 MCG inhalation capsule Place 1 capsule (18 mcg total) into inhaler and inhale 2 (two) times daily. Patient not taking: Reported on 01/06/2019 03/27/15   Nita Sells, MD    Social History   Socioeconomic History  . Marital status: Married    Spouse name: Not on file  . Number of children: Not on file  . Years of education: Not on file  . Highest education level: Not on file  Occupational History  . Not on file  Social Needs  . Financial resource strain: Not on file  . Food insecurity:    Worry: Not on file    Inability: Not on file  . Transportation needs:    Medical: Not on file    Non-medical: Not on file  Tobacco Use  . Smoking status: Current Every Day Smoker    Packs/day: 0.25    Years: 47.00    Pack years: 11.75    Types: Cigarettes  . Smokeless tobacco: Never Used  Substance and Sexual Activity  . Alcohol use: Not Currently    Alcohol/week: 56.0 standard drinks    Types: 56 Cans of beer per week    Comment: quit 1 year ago   . Drug use: No  . Sexual activity: Not Currently  Lifestyle  . Physical activity:    Days per week: Not on file    Minutes per session: Not on file  . Stress: Not on file  Relationships  . Social connections:    Talks on phone: Not on file    Gets together: Not on file     Attends religious service: Not on file    Active member of club or organization: Not on file    Attends meetings of clubs or organizations: Not on file    Relationship status: Not on file  . Intimate partner violence:    Fear of current or ex partner: Not on file    Emotionally abused: Not on file    Physically abused: Not on file    Forced sexual activity: Not on file  Other Topics Concern  . Not on file  Social History Narrative  . Not on file     Family History  Problem Relation Age of Onset  . CVA Other        Grandparents had strokes    ROS: [x]  Positive   [ ]   Negative   [ ]  All sytems reviewed and are negative  Cardiovascular: []  chest pain/pressure []  palpitations []  SOB lying flat [x]  DOE []  pain in legs while walking []  pain in legs at rest []  pain in legs at night []  non-healing ulcers []  hx of DVT []  swelling in legs  Pulmonary: []  productive cough []  asthma/wheezing []  home O2  Neurologic: []  weakness in []  arms []  legs []  numbness in []  arms []  legs []  hx of CVA []  mini stroke [] difficulty speaking or slurred speech []  temporary loss of vision in one eye []  dizziness  Hematologic: []  hx of cancer []  bleeding problems []  problems with blood clotting easily  Endocrine:   []  diabetes []  thyroid disease  GI []  vomiting blood []  blood in stool  GU: []  CKD/renal failure []  HD--[]  M/W/F or []  T/T/S []  burning with urination []  blood in urine  Psychiatric: []  anxiety []  depression  Musculoskeletal: []  arthritis []  joint pain  Integumentary: []  rashes []  ulcers  Constitutional: [x]  fever []  chills   Physical Examination  Vitals:   01/10/19 1515 01/10/19 1554  BP: 140/72 (!) 154/96  Pulse: 60 72  Resp: (!) 22 20  Temp:    SpO2: 100% 99%   Body mass index is 27.8 kg/m.  General:  Resting in stretcher in ED, NAD Gait: Not observed HENT: WNL, normocephalic Pulmonary: Nasal cannula, no immediate distress Cardiac:  regular Abdomen: soft, NT/ND, no masses Skin: Hyperemia in right toes Vascular Exam/Pulses:  Right Left  Radial 2+ (normal) 2+ (normal)  Ulnar    Femoral 2+ (normal) 2+ (normal)  Popliteal    DP Weakly palpable, brisk signal   PT Monophasic signal    Extremities: without ischemic changes, without Gangrene  Musculoskeletal: no muscle wasting or atrophy  Neurologic: A&O X 3; Appropriate Affect ; SENSATION: normal; MOTOR FUNCTION:  moving all extremities equally. Speech is fluent/normal    CBC    Component Value Date/Time   WBC 11.3 (H) 01/10/2019 1230   RBC 3.65 (L) 01/10/2019 1230   HGB 11.5 (L) 01/10/2019 1230   HCT 37.1 (L) 01/10/2019 1230   PLT 183 01/10/2019 1230   MCV 101.6 (H) 01/10/2019 1230   MCH 31.5 01/10/2019 1230   MCHC 31.0 01/10/2019 1230   RDW 13.3 01/10/2019 1230   LYMPHSABS 1.3 01/10/2019 1230   MONOABS 1.5 (H) 01/10/2019 1230   EOSABS 0.0 01/10/2019 1230   BASOSABS 0.0 01/10/2019 1230    BMET    Component Value Date/Time   NA 136 01/10/2019 1230   K 3.9 01/10/2019 1230   CL 100 01/10/2019 1230   CO2 26 01/10/2019 1230   GLUCOSE 115 (H) 01/10/2019 1230   BUN 18 01/10/2019 1230   CREATININE 1.09 01/10/2019 1230   CALCIUM 7.9 (L) 01/10/2019 1230   GFRNONAA >60 01/10/2019 1230   GFRAA >60 01/10/2019 1230    COAGS: Lab Results  Component Value Date   INR 1.0 01/05/2019   INR 1.05 03/26/2015   INR 1.12 03/25/2015   INR 1.14 03/25/2015     Non-Invasive Vascular Imaging:    Chest x-ray: No evidence of significant cardiopulmonary disease.   ASSESSMENT/PLAN: This is a 68 y.o. male that presents to the ED two days status post right femoral to anterior tibial bypass for critical limb ischemia.  Fortunately his bypass appears to be open and he has a very brisk dorsalis pedis signal on exam in the right foot and what I think is a weakly palpable dorsalis  pedis pulse as well.  His foot is very warm and states it feels much better.  He has some  hyperemia/dependent rubor in the toes that was present on discharge.  I think all of his incisions look fine.  Upon further assessment appears to have been a respiratory type presentation given reported fever, hypoxia, cough at home.  He has tested negative for COVID in the ED.  His chest x-ray is relatively clear.  CTA chest negative for PE.  Not sure if this could be COPD exacerbation.  He is undergoing a sepsis work-up as well.  Appreciate the ED/medicine for their assistance and vascular will follow.  Marty Heck, MD [facility] Office: [phone] Pager: [phone]

## 2019-01-10 NOTE — ED Notes (Signed)
ED TO INPATIENT HANDOFF REPORT  ED Nurse Name and Phone #: [phone]  S Name/Age/Gender Omar Singh 68 y.o. male Room/Bed: 024C/024C  Code Status   Code Status: Full Code  Home/SNF/Other Home Patient oriented to: self, place and time Is this baseline? No   Triage Complete: Triage complete  Chief Complaint SOB/fever/ cough  Triage Note Pt from home via ems; recent sx 2 days ago for vascular bypass; developed redness in R toes yesterday; periods of ams, tremors, fever, cough that began yesterday; pt unable to ambulate; initial sats 80% w/ ems; pt endorses sob; 99.8 temporal; normally A and O and ambulatory per pt's wife  140/80 hx htn HR 82 sinus 98-100% NRB   Allergies No Known Allergies  Level of Care/Admitting Diagnosis ED Disposition    ED Disposition Condition [place]: [place] [[id number]]  Level of Care: Telemetry Medical [104]  Covid Evaluation: N/A  Diagnosis: COPD (chronic obstructive pulmonary disease) [facility]) [[id number]]  Admitting Physician: Georgette Shell [[id number]]  Attending Physician: Georgette Shell [[id number]]  Estimated length of stay: 3 - 4 days  Certification:: I certify this patient will need inpatient services for at least 2 midnights  PT Class (Do Not Modify): Inpatient [101]  PT Acc Code (Do Not Modify): Private [1]       B Medical/Surgery History Past Medical History:  Diagnosis Date  . Acute exacerbation of chronic obstructive pulmonary disease (COPD) ([place])    [name] 03/23/2015  . Alcohol abuse   . CAD (coronary artery disease)    a. s/p multi-link vision stent to the mid RCA in 2007 at [facility].  . Hyperlipidemia   . Hypertension   . Myocardial infarction [facility]) 2008   [name] 03/23/2015  . PAD (peripheral artery disease) ([place])   . Panic attack   . Tobacco abuse    Past Surgical History:  Procedure Laterality Date  . CARDIAC CATHETERIZATION N/A 03/25/2015   Procedure: Left Heart Cath  and Coronary Angiography;  Surgeon: Burnell Blanks, MD;  Location: [place] [facility];  Service: Cardiovascular;  Laterality: N/A;  . CARDIAC CATHETERIZATION N/A 03/25/2015   Procedure: Coronary Stent Intervention;  Surgeon: Burnell Blanks, MD;  Location: [place] [facility];  Service: Cardiovascular;  Laterality: N/A;  . CORONARY ANGIOPLASTY WITH STENT PLACEMENT  2008   "1"  . FEMORAL-TIBIAL BYPASS GRAFT Right 01/08/2019   Procedure: RIGHT FEMORAL Endartarectomy and profundoplasty using greater saphenous vein graft. Right anterior tibial artery bypass with nonreversed saphenous vein graft;  Surgeon: Angelia Mould, MD;  Location: [facility] OR;  Service: Vascular;  Laterality: Right;  . LOWER EXTREMITY ANGIOGRAM Right 01/08/2019   Procedure: Lower Extremity Angiogram;  Surgeon: Angelia Mould, MD;  Location: [place];  Service: Vascular;  Laterality: Right;  . LOWER EXTREMITY ANGIOGRAPHY N/A 01/06/2019   Procedure: LOWER EXTREMITY ANGIOGRAPHY;  Surgeon: Serafina Mitchell, MD;  Location: [place] [facility];  Service: Cardiovascular;  Laterality: N/A;  . TONSILLECTOMY       A IV Location/Drains/Wounds Patient Lines/Drains/Airways Status   Active Line/Drains/Airways    Name:   Placement date:   Placement time:   Site:   Days:   Peripheral IV 01/10/19 Left Hand   01/10/19    -    Hand   less than 1   Peripheral IV 01/10/19 Right Antecubital   01/10/19    1228    Antecubital   less than 1   Incision (Closed)  01/08/19 Groin Right   01/08/19    1334     2   Incision (Closed) 01/08/19 Leg Right   01/08/19    1334     2   Incision (Closed) 01/08/19 Leg Right   01/08/19    1334     2   Incision (Closed) 01/08/19 Leg Right   01/08/19    1334     2   Incision (Closed) 01/08/19 Leg Right   01/08/19    1334     2   Incision (Closed) 01/08/19 Leg Right   01/08/19    1334     2   Incision (Closed) 01/08/19 Leg Right   01/08/19    1334     2   Incision (Closed) 01/08/19 Leg Right   01/08/19     1540     2   Wound / Incision (Open or Dehisced) 01/06/19 Puncture Thigh Anterior;Proximal;Right   01/06/19    1800    Thigh   4   Wound / Incision (Open or Dehisced) 01/06/19 Thigh Anterior;Left;Proximal   01/06/19    1800    Thigh   4          Intake/Output Last 24 hours  Intake/Output Summary (Last 24 hours) at 01/10/2019 1842 Last data filed at 01/10/2019 1457 Gross per 24 hour  Intake 250 ml  Output -  Net 250 ml    Labs/Imaging Results for orders placed or performed during the hospital encounter of 01/10/19 (from the past 48 hour(s))  SARS Coronavirus 2 [facility] order, Performed in [place] hospital lab)     Status: None   Collection Time: 01/10/19 12:30 PM  Result Value Ref Range   SARS Coronavirus 2 NEGATIVE NEGATIVE    Comment: (NOTE) If result is NEGATIVE SARS-CoV-2 target nucleic acids are NOT DETECTED. The SARS-CoV-2 RNA is generally detectable in upper and lower  respiratory specimens during the acute phase of infection. The lowest  concentration of SARS-CoV-2 viral copies this assay can detect is 250  copies / mL. A negative result does not preclude SARS-CoV-2 infection  and should not be used as the sole basis for treatment or other  patient management decisions.  A negative result may occur with  improper specimen collection / handling, submission of specimen other  than nasopharyngeal swab, presence of viral mutation(s) within the  areas targeted by this assay, and inadequate number of viral copies  (<250 copies / mL). A negative result must be combined with clinical  observations, patient history, and epidemiological information. If result is POSITIVE SARS-CoV-2 target nucleic acids are DETECTED. The SARS-CoV-2 RNA is generally detectable in upper and lower  respiratory specimens dur ing the acute phase of infection.  Positive  results are indicative of active infection with SARS-CoV-2.  Clinical  correlation with patient history and other diagnostic  information is  necessary to determine patient infection status.  Positive results do  not rule out bacterial infection or co-infection with other viruses. If result is PRESUMPTIVE POSTIVE SARS-CoV-2 nucleic acids MAY BE PRESENT.   A presumptive positive result was obtained on the submitted specimen  and confirmed on repeat testing.  While 2019 novel coronavirus  (SARS-CoV-2) nucleic acids may be present in the submitted sample  additional confirmatory testing may be necessary for epidemiological  and / or clinical management purposes  to differentiate between  SARS-CoV-2 and other Sarbecovirus currently known to infect humans.  If clinically indicated additional testing with an alternate test  methodology [phone]) is advised. The SARS-CoV-2 RNA is generally  detectable in upper and lower respiratory sp ecimens during the acute  phase of infection. The expected result is Negative. Fact Sheet for Patients:  StrictlyIdeas.no Fact Sheet for Healthcare Providers: BankingDealers.co.za This test is not yet approved or cleared by the [place] FDA and has been authorized for detection and/or diagnosis of SARS-CoV-2 by FDA under an Emergency Use Authorization (EUA).  This EUA will remain in effect (meaning this test can be used) for the duration of the COVID-19 declaration under Section 564(b)(1) of the Act, 21 U.S.C. section 360bbb-3(b)(1), unless the authorization is terminated or revoked sooner. Performed at [facility], [place] [address] [place], [place] [address]   Lactic acid, plasma     Status: None   Collection Time: 01/10/19 12:30 PM  Result Value Ref Range   Lactic Acid, Venous 1.2 0.5 - 1.9 mmol/L    Comment: Performed at [facility] [address], [place], [place] [address]  CBC WITH DIFFERENTIAL     Status: Abnormal   Collection Time: 01/10/19 12:30 PM  Result Value Ref Range   WBC 11.3 (H) 4.0 - 10.5 K/uL    RBC 3.65 (L) 4.22 - 5.81 MIL/uL   Hemoglobin 11.5 (L) 13.0 - 17.0 g/dL   HCT 37.1 (L) 39.0 - 52.0 %   MCV 101.6 (H) 80.0 - 100.0 fL   MCH 31.5 26.0 - 34.0 pg   MCHC 31.0 30.0 - 36.0 g/dL   RDW 13.3 11.5 - 15.5 %   Platelets 183 150 - 400 K/uL   nRBC 0.0 0.0 - 0.2 %   Neutrophils Relative % 74 %   Neutro Abs 8.3 (H) 1.7 - 7.7 K/uL   Lymphocytes Relative 12 %   Lymphs Abs 1.3 0.7 - 4.0 K/uL   Monocytes Relative 14 %   Monocytes Absolute 1.5 (H) 0.1 - 1.0 K/uL   Eosinophils Relative 0 %   Eosinophils Absolute 0.0 0.0 - 0.5 K/uL   Basophils Relative 0 %   Basophils Absolute 0.0 0.0 - 0.1 K/uL   Immature Granulocytes 0 %   Abs Immature Granulocytes 0.05 0.00 - 0.07 K/uL    Comment: Performed at [facility], [place] [address], [place], [place] [address]  Comprehensive metabolic panel     Status: Abnormal   Collection Time: 01/10/19 12:30 PM  Result Value Ref Range   Sodium 136 135 - 145 mmol/L   Potassium 3.9 3.5 - 5.1 mmol/L   Chloride 100 98 - 111 mmol/L   CO2 26 22 - 32 mmol/L   Glucose, Bld 115 (H) 70 - 99 mg/dL   BUN 18 8 - 23 mg/dL   Creatinine, Ser 1.09 0.61 - 1.24 mg/dL   Calcium 7.9 (L) 8.9 - 10.3 mg/dL   Total Protein 5.3 (L) 6.5 - 8.1 g/dL   Albumin 2.8 (L) 3.5 - 5.0 g/dL   AST 26 15 - 41 U/L   ALT 16 0 - 44 U/L   Alkaline Phosphatase 48 38 - 126 U/L   Total Bilirubin 0.8 0.3 - 1.2 mg/dL   GFR calc non Af Amer >60 >60 mL/min   GFR calc Af Amer >60 >60 mL/min   Anion gap 10 5 - 15    Comment: Performed at [facility] [address] [place], [place] [address]  D-dimer, quantitative     Status: Abnormal   Collection Time: 01/10/19 12:30 PM  Result Value Ref  Range   D-Dimer, Quant 1.10 (H) 0.00 - 0.50 ug/mL-FEU    Comment: (NOTE) At the manufacturer cut-off of 0.50 ug/mL FEU, this assay has been documented to exclude PE with a sensitivity and negative predictive value of 97 to 99%.  At this time, this assay has not been approved by the FDA to exclude  DVT/VTE. Results should be correlated with clinical presentation. Performed at [facility], [place] [address], [place], [place] [address]   Procalcitonin     Status: None   Collection Time: 01/10/19 12:30 PM  Result Value Ref Range   Procalcitonin <0.10 ng/mL    Comment:        Interpretation: PCT (Procalcitonin) <= 0.5 ng/mL: Systemic infection (sepsis) is not likely. Local bacterial infection is possible. (NOTE)       Sepsis PCT Algorithm           Lower Respiratory Tract                                      Infection PCT Algorithm    ----------------------------     ----------------------------         PCT < 0.25 ng/mL                PCT < 0.10 ng/mL         Strongly encourage             Strongly discourage   discontinuation of antibiotics    initiation of antibiotics    ----------------------------     -----------------------------       PCT 0.25 - 0.50 ng/mL            PCT 0.10 - 0.25 ng/mL               OR       >80% decrease in PCT            Discourage initiation of                                            antibiotics      Encourage discontinuation           of antibiotics    ----------------------------     -----------------------------         PCT >= 0.50 ng/mL              PCT 0.26 - 0.50 ng/mL               AND        <80% decrease in PCT             Encourage initiation of                                             antibiotics       Encourage continuation           of antibiotics    ----------------------------     -----------------------------        PCT >= 0.50 ng/mL                  PCT > 0.50 ng/mL  AND         increase in PCT                  Strongly encourage                                      initiation of antibiotics    Strongly encourage escalation           of antibiotics                                     -----------------------------                                           PCT <= 0.25 ng/mL                                                  OR                                        > 80% decrease in PCT                                     Discontinue / Do not initiate                                             antibiotics Performed at [facility], [address] [place] [address] [place], [place] [address]   Lactate dehydrogenase     Status: None   Collection Time: 01/10/19 12:30 PM  Result Value Ref Range   LDH 155 98 - 192 U/L    Comment: Performed at [facility], [place] [address], [place], [place] [address]  Ferritin     Status: None   Collection Time: 01/10/19 12:30 PM  Result Value Ref Range   Ferritin 141 24 - 336 ng/mL    Comment: Performed at [facility] [address], [place], [place] [address]  Fibrinogen     Status: Abnormal   Collection Time: 01/10/19 12:30 PM  Result Value Ref Range   Fibrinogen 560 (H) 210 - 475 mg/dL    Comment: Performed at [facility] [address], [place], [place] [address]  C-reactive protein     Status: Abnormal   Collection Time: 01/10/19 12:30 PM  Result Value Ref Range   CRP 9.1 (H) <1.0 mg/dL    Comment: Performed at [facility] [address], [place], [place] [address]  Triglycerides     Status: None   Collection Time: 01/10/19 12:30 PM  Result Value Ref Range   Triglycerides 93 <150 mg/dL    Comment: Performed at [facility] [address], [place], [place] [address]  Urinalysis, Routine w reflex microscopic  Status: Abnormal   Collection Time: 01/10/19  4:08 PM  Result Value Ref Range   Color, Urine YELLOW YELLOW   APPearance CLEAR CLEAR   Specific Gravity, Urine 1.015 1.005 - 1.030   pH 5.0 5.0 - 8.0   Glucose, UA NEGATIVE NEGATIVE mg/dL   Hgb urine dipstick SMALL (A) NEGATIVE   Bilirubin Urine NEGATIVE NEGATIVE   Ketones, ur NEGATIVE NEGATIVE mg/dL   Protein, ur NEGATIVE NEGATIVE mg/dL   Nitrite NEGATIVE NEGATIVE   Leukocytes,Ua NEGATIVE NEGATIVE   RBC / HPF 0-5 0 - 5 RBC/hpf   WBC, UA 0-5 0 - 5 WBC/hpf    Bacteria, UA NONE SEEN NONE SEEN   Squamous Epithelial / LPF 0-5 0 - 5   Mucus PRESENT     Comment: Performed at [facility], [place] [address], [place], [place] [address]   Ct Angio Chest Pe W/cm &/or Wo Cm  Result Date: 01/10/2019 CLINICAL DATA:  Fever and cough. Suspect pulmonary embolism. Recent femoral tibial bypass graft 01/08/2019 EXAM: CT ANGIOGRAPHY CHEST WITH CONTRAST TECHNIQUE: Multidetector CT imaging of the chest was performed using the standard protocol during bolus administration of intravenous contrast. Multiplanar CT image reconstructions and MIPs were obtained to evaluate the vascular anatomy. CONTRAST:  85mL OMNIPAQUE IOHEXOL 300 MG/ML  SOLN COMPARISON:  Chest 01/10/2019 FINDINGS: Cardiovascular: Negative for pulmonary embolism. Pulmonary arteries normal in caliber with normal enhancement. Mild atherosclerotic disease in the aortic arch without aneurysm or dissection. Ascending aorta 39 mm in diameter. Moderate to extensive coronary artery calcification. Heart size normal without pericardial effusion Mediastinum/Nodes: Negative for mass or adenopathy Lungs/Pleura: Mild dependent atelectasis in the right posterior lung base. Negative for pneumonia or effusion. No mass or lung nodule identified. Upper Abdomen: Layering density in the gallbladder may be vicarious excretion of contrast from prior angiogram on 01/08/2019. Small adrenal adenoma on the left 15 mm. Musculoskeletal: No acute abnormality. Review of the MIP images confirms the above findings. IMPRESSION: Negative for pulmonary embolism.  No acute abnormality in the chest. Atherosclerotic aorta. Moderate to extensive coronary calcification. Electronically Signed   By: Franchot Gallo M.D.   On: 01/10/2019 16:32   Dg Chest Port 1 View  Result Date: 01/10/2019 CLINICAL DATA:  Read toes.  Fever and cough. EXAM: PORTABLE CHEST 1 VIEW COMPARISON:  12/05/2018 FINDINGS: Normal heart size. Lungs clear. No pneumothorax. No pleural  effusion. IMPRESSION: No active disease. Electronically Signed   By: Marybelle Killings M.D.   On: 01/10/2019 13:04    Pending Labs Unresulted Labs (From admission, onward)    Start     Ordered   01/11/19 0500  CBC with Differential/Platelet  Tomorrow morning,   R     01/10/19 1832   01/11/19 7782  Basic metabolic panel  Tomorrow morning,   R     01/10/19 1832   01/10/19 1823  Influenza panel by PCR (type A & B)  (Influenza PCR Panel)  Once,   R     01/10/19 1822   01/10/19 1822  RSV(respiratory syncytial virus) ab, bld  Once,   R     01/10/19 1822   01/10/19 1650  Troponin I - Once  Once,   STAT     01/10/19 1649   01/10/19 1208  Blood Culture (routine x 2)  BLOOD CULTURE X 2,   STAT     01/10/19 1209          Vitals/Pain Today's Vitals   01/10/19 1733 01/10/19 1745 01/10/19 1800 01/10/19  1830  BP: (!) 156/91 139/86 (!) 140/102 (!) 169/103  Pulse: 73 67 71 70  Resp: 14 20 (!) 25 (!) 29  Temp:      TempSrc:      SpO2: 98% 94% 100% 100%  Weight:      Height:      PainSc:        Isolation Precautions Droplet precaution  Medications Medications  tiotropium (SPIRIVA) inhalation capsule ([facility] use ONLY) 18 mcg (has no administration in time range)  albuterol (VENTOLIN HFA) 108 (90 Base) MCG/ACT inhaler 2 puff (has no administration in time range)  mometasone-formoterol (DULERA) 200-5 MCG/ACT inhaler 2 puff (has no administration in time range)  atorvastatin (LIPITOR) tablet 40 mg (has no administration in time range)  aspirin EC tablet 81 mg (has no administration in time range)  metoprolol tartrate (LOPRESSOR) tablet 25 mg (has no administration in time range)  methylPREDNISolone sodium succinate (SOLU-MEDROL) 125 mg/2 mL injection 60 mg (has no administration in time range)  enoxaparin (LOVENOX) injection 40 mg (has no administration in time range)  piperacillin-tazobactam (ZOSYN) IVPB 3.375 g (0 g Intravenous Stopped 01/10/19 1457)  vancomycin (VANCOCIN) IVPB 1000 mg/200 mL  premix (0 mg Intravenous Stopped 01/10/19 1436)  iohexol (OMNIPAQUE) 300 MG/ML solution 75 mL (75 mLs Intravenous Contrast Given 01/10/19 1534)  albuterol (VENTOLIN HFA) 108 (90 Base) MCG/ACT inhaler 4 puff (4 puffs Inhalation Given 01/10/19 1729)  ipratropium (ATROVENT HFA) inhaler 2 puff (2 puffs Inhalation Given 01/10/19 1748)    Mobility walks High fall risk   Focused Assessments Neuro Assessment Handoff:   Cardiac Rhythm: Normal sinus rhythm       Neuro Assessment: Exceptions to [facility] Neuro Checks:      Last Documented NIHSS Modified Score:   Has TPA been given? No If patient is a Neuro Trauma and patient is going to OR before floor call report to [place] nurse: [phone] or [phone]     R Recommendations: See Admitting Provider Note  Report given to:   Additional Notes:

## 2019-01-10 NOTE — ED Provider Notes (Signed)
Signed out by Dr Tamera Punt to admit once cta resulted. Pt with generalized weakness, too weak to walk, hypoxia/copd exacerbation, post recent vascular surgery procedure. CTA neg for PE or pna. Pt wheezing. Appears generally weak/shaky - report of hx etoh abuse, but none x 1 year, +smoker. Albuterol and atrovent mdis.  Unassigned medicine consulted for admission.      Lajean Saver, MD 01/10/19 [phone]

## 2019-01-10 NOTE — ED Notes (Signed)
Attempted to give report 50M and was told the nurse was in a patient room. Gave a call back number for the nurse to reach me.

## 2019-01-10 NOTE — ED Notes (Signed)
RN could not draw labs. IV would not draw back.

## 2019-01-11 ENCOUNTER — Other Ambulatory Visit: Payer: Self-pay

## 2019-01-11 ENCOUNTER — Encounter (HOSPITAL_COMMUNITY): Payer: Self-pay | Admitting: *Deleted

## 2019-01-11 DIAGNOSIS — G252 Other specified forms of tremor: Secondary | ICD-10-CM

## 2019-01-11 DIAGNOSIS — I10 Essential (primary) hypertension: Secondary | ICD-10-CM

## 2019-01-11 LAB — CBC WITH DIFFERENTIAL/PLATELET
Abs Immature Granulocytes: 0.05 10*3/uL (ref 0.00–0.07)
Basophils Absolute: 0 10*3/uL (ref 0.0–0.1)
Basophils Relative: 0 %
Eosinophils Absolute: 0 10*3/uL (ref 0.0–0.5)
Eosinophils Relative: 0 %
HCT: 36.4 % — ABNORMAL LOW (ref 39.0–52.0)
Hemoglobin: 11.4 g/dL — ABNORMAL LOW (ref 13.0–17.0)
Immature Granulocytes: 1 %
Lymphocytes Relative: 5 %
Lymphs Abs: 0.5 10*3/uL — ABNORMAL LOW (ref 0.7–4.0)
MCH: 31.3 pg (ref 26.0–34.0)
MCHC: 31.3 g/dL (ref 30.0–36.0)
MCV: 100 fL (ref 80.0–100.0)
Monocytes Absolute: 0.2 10*3/uL (ref 0.1–1.0)
Monocytes Relative: 2 %
Neutro Abs: 9.8 10*3/uL — ABNORMAL HIGH (ref 1.7–7.7)
Neutrophils Relative %: 92 %
Platelets: 186 10*3/uL (ref 150–400)
RBC: 3.64 MIL/uL — ABNORMAL LOW (ref 4.22–5.81)
RDW: 13.1 % (ref 11.5–15.5)
WBC: 10.6 10*3/uL — ABNORMAL HIGH (ref 4.0–10.5)
nRBC: 0 % (ref 0.0–0.2)

## 2019-01-11 LAB — BASIC METABOLIC PANEL
Anion gap: 12 (ref 5–15)
BUN: 11 mg/dL (ref 8–23)
CO2: 25 mmol/L (ref 22–32)
Calcium: 8.5 mg/dL — ABNORMAL LOW (ref 8.9–10.3)
Chloride: 99 mmol/L (ref 98–111)
Creatinine, Ser: 0.9 mg/dL (ref 0.61–1.24)
GFR calc Af Amer: >60 mL/min (ref >60)
GFR calc non Af Amer: >60 mL/min (ref >60)
Glucose, Bld: 140 mg/dL — ABNORMAL HIGH (ref 70–99)
Potassium: 4.5 mmol/L (ref 3.5–5.1)
Sodium: 136 mmol/L (ref 135–145)

## 2019-01-11 LAB — TROPONIN I
Troponin I: 0.04 ng/mL (ref <0.03)
Troponin I: 0.05 ng/mL (ref <0.03)
Troponin I: 0.06 ng/mL (ref <0.03)

## 2019-01-11 MED ORDER — VANCOMYCIN HCL 10 G IV SOLR
2000.0000 mg | INTRAVENOUS | Status: DC
  Administered 2019-01-11 – 2019-01-13 (×3): 2000 mg via INTRAVENOUS
  Filled 2019-01-11 (×5): qty 2000

## 2019-01-11 MED ORDER — KETOROLAC TROMETHAMINE 30 MG/ML IJ SOLN: 15.0000 mg | Freq: Once | INTRAMUSCULAR | Status: DC

## 2019-01-11 MED ORDER — SENNOSIDES-DOCUSATE SODIUM 8.6-50 MG PO TABS
1.0000 | ORAL_TABLET | Freq: Every evening | ORAL | Status: DC | PRN
  Administered 2019-01-11: 1 via ORAL
  Filled 2019-01-11: qty 1

## 2019-01-11 MED ORDER — PIPERACILLIN-TAZOBACTAM 3.375 G IVPB
3.3750 g | Freq: Three times a day (TID) | INTRAVENOUS | Status: DC
  Administered 2019-01-11 – 2019-01-13 (×8): 3.375 g via INTRAVENOUS
  Filled 2019-01-11 (×10): qty 50

## 2019-01-11 NOTE — Progress Notes (Signed)
[facility]  Subjective  - Admitted from ED last night with hypoxia, SOB, fever, etc.  Looks better today.   Objective (!) 142/74 74 98.7 F (37.1 C) (Oral) (!) 21 92%  Intake/Output Summary (Last 24 hours) at 01/11/2019 1016 Last data filed at 01/11/2019 0954 Gross per 24 hour  Intake 1213.47 ml  Output 200 ml  Net 1013.47 ml    RLE incisions c/d/i R DP weakly palpable   Laboratory Lab Results: Recent Labs    01/10/19 1230 01/11/19 0508  WBC 11.3* 10.6*  HGB 11.5* 11.4*  HCT 37.1* 36.4*  PLT 183 186   BMET Recent Labs    01/10/19 1230 01/11/19 0508  NA 136 136  K 3.9 4.5  CL 100 99  CO2 26 25  GLUCOSE 115* 140*  BUN 18 11  CREATININE 1.09 0.90  CALCIUM 7.9* 8.5*    COAG Lab Results  Component Value Date   INR 1.0 01/05/2019   INR 1.05 03/26/2015   INR 1.12 03/25/2015   INR 1.14 03/25/2015   No results found for: PTT  Assessment/Planning:  Doing better since evaluation in ED last night.  Right DP weakly palpable and patient states foot feels much better since fem AT bypass.  Overall seems to be improving.  Appreciate medicine input.  No growth on cultures and labs reassuring.    Marty Heck 01/11/2019 10:16 AM --

## 2019-01-11 NOTE — Evaluation (Signed)
Physical Therapy Evaluation Patient Details Name: Omar Singh MRN: [id number] DOB: 1951-07-27 Today's Date: 01/11/2019   History of Present Illness  68 y.o. male with medical history significant of COPD, CAD, alcohol abuse last drink was a year ago, tobacco abuse continues to smoke paroxysmal atrial fibrillation, peripheral vascular disease status post right lower extremity revascularization right femoral endarterectomy 01/08/2019 by Dr. Doren Custard.  He was discharged home 01/09/2019.  He is admitted with complaints of generalized weakness and hypoxia shortness of breath and cough    Clinical Impression  Pt admitted with above diagnosis. Pt currently with functional limitations due to the deficits listed below (see PT Problem List). PTA, pt at home with wife, ambulating with RW due to RLE pain. Today ambulating 60' on RA desat to 88%.Will continue to follow to progress activity before d/c.  Pt will benefit from skilled PT to increase their independence and safety with mobility to allow discharge to the venue listed below.       Follow Up Recommendations No PT follow up    Equipment Recommendations  None recommended by PT    Recommendations for Other Services       Precautions / Restrictions        Mobility  Bed Mobility Overal bed mobility: Modified Independent                Transfers Overall transfer level: Modified independent                  Ambulation/Gait Ambulation/Gait assistance: Supervision Gait Distance (Feet): 60 Feet Assistive device: Rolling walker (2 wheeled);None Gait Pattern/deviations: Step-through pattern Gait velocity: slower   General Gait Details: antalgic, but steady  Financial trader Rankin (Stroke Patients Only)       Balance Overall balance assessment: Mild deficits observed, not formally tested                                           Pertinent Vitals/Pain Pain  Assessment: Faces Faces Pain Scale: Hurts a little bit Pain Location: Leg Pain Descriptors / Indicators: Burning Pain Intervention(s): Limited activity within patient's tolerance    Home Living Family/patient expects to be discharged to:: Private residence Living Arrangements: Spouse/significant other Available Help at Discharge: Family;Available 24 hours/day Type of Home: House Home Access: Stairs to enter Entrance Stairs-Rails: Right;Left;Can reach both Entrance Stairs-Number of Steps: 4 Home Layout: One level Home Equipment: Shower seat;Grab bars - tub/shower      Prior Function Level of Independence: Independent with assistive device(s)         Comments: using RW for mobility recently     Hand Dominance        Extremity/Trunk Assessment   Upper Extremity Assessment Upper Extremity Assessment: Overall WFL for tasks assessed    Lower Extremity Assessment Lower Extremity Assessment: Overall WFL for tasks assessed    Cervical / Trunk Assessment Cervical / Trunk Assessment: Normal  Communication   Communication: No difficulties  Cognition Arousal/Alertness: Awake/alert Behavior During Therapy: WFL for tasks assessed/performed Overall Cognitive Status: Within Functional Limits for tasks assessed                                        General  Comments      Exercises     Assessment/Plan    PT Assessment Patent does not need any further PT services  PT Problem List         PT Treatment Interventions      PT Goals (Current goals can be found in the Care Plan section)  Acute Rehab PT Goals Patient Stated Goal: get home ASAP PT Goal Formulation: All assessment and education complete, DC therapy Time For Goal Achievement: 01/25/19 Potential to Achieve Goals: Good    Frequency     Barriers to discharge        Co-evaluation               AM-PAC PT "6 Clicks" Mobility  Outcome Measure Help needed turning from your back to  your side while in a flat bed without using bedrails?: None Help needed moving from lying on your back to sitting on the side of a flat bed without using bedrails?: None Help needed moving to and from a bed to a chair (including a wheelchair)?: None Help needed standing up from a chair using your arms (e.g., wheelchair or bedside chair)?: None Help needed to walk in hospital room?: None Help needed climbing 3-5 steps with a railing? : A Little 6 Click Score: 23    End of Session   Activity Tolerance: Patient tolerated treatment well Patient left: in chair;with call bell/phone within reach Nurse Communication: Mobility status PT Visit Diagnosis: Other abnormalities of gait and mobility (R26.89);Pain Pain - Right/Left: Right Pain - part of body: Leg    Time: 1300-1325 PT Time Calculation (min) (ACUTE ONLY): 25 min   Charges:   PT Evaluation $PT Eval Low Complexity: 1 Low PT Treatments $Gait Training: 8-22 mins       Reinaldo Berber, PT, DPT Acute Rehabilitation Services Pager: [phone] Office: [phone]   Reinaldo Berber 01/11/2019, 1:33 PM

## 2019-01-11 NOTE — Progress Notes (Signed)
PROGRESS NOTE    Omar Singh  OQH:[id number] DOB: 06/18/1951 DOA: 01/10/2019 PCP: Patient, No Pcp Per  Brief Narrative: 68 y.o. male with medical history significant of COPD, CAD, alcohol abuse last drink was a year ago, tobacco abuse continues to smoke paroxysmal atrial fibrillation, peripheral vascular disease status post right lower extremity revascularization right femoral endarterectomy 01/08/2019 by Dr. Doren Custard.  He was discharged home 01/09/2019.  He is admitted with complaints of generalized weakness and hypoxia shortness of breath and cough.  Patient does not have oxygen at home.  His saturation was in the 80s in the emergency room.  He had complains of subjective fever with generalized tremor starting last night.  He denies any nausea vomiting abdominal pain diarrhea chest pain headaches.  He is awake alert and oriented.  He reported that approximately 3 years ago he had shaking like this and he had come to the hospital and was told he probably have seizures and was sent home on the same day.  In ed received albuterol, Vanco and Zosyn.  CT angiogram negative for PE extensive coronary calcification no evidence of consolidation or effusion or mass.  Labs sodium 136 potassium 3.9 BUN 18 creatinine 1.09.  Normal LFTs.  LDH 155 ferritin 141 CRP 9.1 lactic acid 1.2 procalcitonin less than 2.10.  White count 11.3 hemoglobin 11.5 platelet count 183, d-dimer 1.10.  He was 80% on room air upon arrival to ER he was placed on a nonrebreather mask.   Assessment & Plan:   Active Problems:   COPD (chronic obstructive pulmonary disease) (HCC)   #1 acute hypoxic respiratory failure patient with recent right lower extremity bypass surgery and history of COPD patient admitted with shortness of breath cough subjective fever.  CT of the chest does not show any evidence of PE or pneumonia or pleural effusion.  Patient has been wheezing.  I will treat him with steroids inhalers COVID-19 negative COVID-19 ruled  out.   flu panel negative.    Follow-up blood cultures.  Continue Spiriva Dulera and Ventolin.  Patient is still dependent on 2.5 L of oxygen.  Titrate and taper oxygen to DC.  Prior to admission he was not on oxygen at home.  He reports he is feeling better than yesterday however not back to his baseline.  Continue IV steroids and taper as patient is able to tolerate.  Patient is at high risk for respiratory and cardiac decompensation from hypoxia.  #2 new onset tremor has been resolved!!  #3 hypertension continue metoprolol hold Cozaar his creatinine is slightly higher than his baseline.  #4 PVD status post right lower extremity revascularization discharged from the hospital for 24 2020.  By Dr. Scot Dock.  DVT prophylaxis: Lovenox Code Status: Full code Family Communication: None Disposition Plan: Pending clinical improvement Consults called: None   Estimated body mass index is 27.27 kg/m as calculated from the following:   Height as of this encounter: 5\' 11"  (1.803 m).   Weight as of this encounter: 88.7 kg.   Subjective: Patient is resting in bed was asleep when I walked into the room is on 2.5 L of oxygen no tremors observed while he was sleeping and awake afebrile   Objective: Vitals:   01/11/19 0206 01/11/19 0433 01/11/19 0900 01/11/19 0954  BP: (!) 149/66 (!) 153/77  (!) 142/74  Pulse: 61 66  74  Resp: (!) 23 (!) 23  (!) 21  Temp: 98.8 F (37.1 C) 98.5 F (36.9 C)  98.7 F (37.1 C)  TempSrc: Oral Oral  Oral  SpO2: 99% 97% 93% 92%  Weight:      Height:        Intake/Output Summary (Last 24 hours) at 01/11/2019 1013 Last data filed at 01/11/2019 0954 Gross per 24 hour  Intake 1213.47 ml  Output 200 ml  Net 1013.47 ml   Filed Weights   01/10/19 1205 01/10/19 2042  Weight: 85.4 kg 88.7 kg    Examination:  General exam: Appears calm and comfortable  Respiratory system: Wheezing bilaterally to auscultation. Respiratory effort normal. Cardiovascular system: S1  & S2 heard, RRR. No JVD, murmurs, rubs, gallops or clicks. No pedal edema. Gastrointestinal system: Abdomen is nondistended, soft and nontender. No organomegaly or masses felt. Normal bowel sounds heard. Central nervous system: Alert and oriented. No focal neurological deficits. Extremities: Symmetric 5 x 5 power. Skin: No rashes, lesions or ulcers Psychiatry: Judgement and insight appear normal. Mood & affect appropriate.     Data Reviewed: I have personally reviewed following labs and imaging studies  CBC: Recent Labs  Lab 01/05/19 1502  01/07/19 0522 01/08/19 0255 01/09/19 0343 01/10/19 1230 01/11/19 0508  WBC 8.2   < > 9.7 9.9 12.3* 11.3* 10.6*  NEUTROABS 6.3  --   --   --   --  8.3* 9.8*  HGB 14.9   < > 13.8 14.0 12.1* 11.5* 11.4*  HCT 47.1   < > 42.6 43.6 39.0 37.1* 36.4*  MCV 99.4   < > 96.6 96.7 99.2 101.6* 100.0  PLT 249   < > 239 227 211 183 186   < > = values in this interval not displayed.   Basic Metabolic Panel: Recent Labs  Lab 01/06/19 0342 01/08/19 0255 01/09/19 0343 01/10/19 1230 01/11/19 0508  NA 137 137 136 136 136  K 4.0 4.0 4.5 3.9 4.5  CL 97* 100 101 100 99  CO2 30 27 27 26 25   GLUCOSE 115* 110* 127* 115* 140*  BUN 9 9 10 18 11   CREATININE 0.92 0.89 0.94 1.09 0.90  CALCIUM 9.0 8.9 8.4* 7.9* 8.5*   GFR: Estimated Creatinine Clearance: 86 mL/min (by C-G formula based on SCr of 0.9 mg/dL). Liver Function Tests: Recent Labs  Lab 01/06/19 0342 01/10/19 1230  AST 24 26  ALT 16 16  ALKPHOS 56 48  BILITOT 0.9 0.8  PROT 6.1* 5.3*  ALBUMIN 3.4* 2.8*   No results for input(s): LIPASE, AMYLASE in the last 168 hours. No results for input(s): AMMONIA in the last 168 hours. Coagulation Profile: Recent Labs  Lab 01/05/19 1502  INR 1.0   Cardiac Enzymes: Recent Labs  Lab 01/10/19 1738 01/10/19 2344 01/11/19 0508  TROPONINI 0.08* 0.06* 0.05*   BNP (last 3 results) No results for input(s): PROBNP in the last 8760 hours. HbA1C: No  results for input(s): HGBA1C in the last 72 hours. CBG: No results for input(s): GLUCAP in the last 168 hours. Lipid Profile: Recent Labs    01/10/19 1230  TRIG 93   Thyroid Function Tests: No results for input(s): TSH, T4TOTAL, FREET4, T3FREE, THYROIDAB in the last 72 hours. Anemia Panel: Recent Labs    01/10/19 1230  FERRITIN 141   Sepsis Labs: Recent Labs  Lab 01/10/19 1230  PROCALCITON <0.10  LATICACIDVEN 1.2    Recent Results (from the past 240 hour(s))  MRSA PCR Screening     Status: None   Collection Time: 01/06/19  2:14 AM  Result Value Ref Range Status   MRSA by PCR NEGATIVE  NEGATIVE Final    Comment:        The GeneXpert MRSA Assay (FDA approved for NASAL specimens only), is one component of a comprehensive MRSA colonization surveillance program. It is not intended to diagnose MRSA infection nor to guide or monitor treatment for MRSA infections. Performed at [facility], [place] [address] [place], [place] [address]   Surgical pcr screen     Status: None   Collection Time: 01/08/19 10:40 AM  Result Value Ref Range Status   MRSA, PCR NEGATIVE NEGATIVE Final   Staphylococcus aureus NEGATIVE NEGATIVE Final    Comment: (NOTE) The Xpert SA Assay (FDA approved for NASAL specimens in patients 65 years of age and older), is one component of a comprehensive surveillance program. It is not intended to diagnose infection nor to guide or monitor treatment. Performed at [facility], [place] [address] [place], [place] [address]   SARS Coronavirus 2 [facility] order, Performed in [facility] hospital lab)     Status: None   Collection Time: 01/10/19 12:30 PM  Result Value Ref Range Status   SARS Coronavirus 2 NEGATIVE NEGATIVE Final    Comment: (NOTE) If result is NEGATIVE SARS-CoV-2 target nucleic acids are NOT DETECTED. The SARS-CoV-2 RNA is generally detectable in upper and lower  respiratory specimens during the acute phase of infection. The  lowest  concentration of SARS-CoV-2 viral copies this assay can detect is 250  copies / mL. A negative result does not preclude SARS-CoV-2 infection  and should not be used as the sole basis for treatment or other  patient management decisions.  A negative result may occur with  improper specimen collection / handling, submission of specimen other  than nasopharyngeal swab, presence of viral mutation(s) within the  areas targeted by this assay, and inadequate number of viral copies  (<250 copies / mL). A negative result must be combined with clinical  observations, patient history, and epidemiological information. If result is POSITIVE SARS-CoV-2 target nucleic acids are DETECTED. The SARS-CoV-2 RNA is generally detectable in upper and lower  respiratory specimens dur ing the acute phase of infection.  Positive  results are indicative of active infection with SARS-CoV-2.  Clinical  correlation with patient history and other diagnostic information is  necessary to determine patient infection status.  Positive results do  not rule out bacterial infection or co-infection with other viruses. If result is PRESUMPTIVE POSTIVE SARS-CoV-2 nucleic acids MAY BE PRESENT.   A presumptive positive result was obtained on the submitted specimen  and confirmed on repeat testing.  While 2019 novel coronavirus  (SARS-CoV-2) nucleic acids may be present in the submitted sample  additional confirmatory testing may be necessary for epidemiological  and / or clinical management purposes  to differentiate between  SARS-CoV-2 and other Sarbecovirus currently known to infect humans.  If clinically indicated additional testing with an alternate test  methodology [phone]) is advised. The SARS-CoV-2 RNA is generally  detectable in upper and lower respiratory sp ecimens during the acute  phase of infection. The expected result is Negative. Fact Sheet for Patients:  StrictlyIdeas.no  Fact Sheet for Healthcare Providers: BankingDealers.co.za This test is not yet approved or cleared by the [place] FDA and has been authorized for detection and/or diagnosis of SARS-CoV-2 by FDA under an Emergency Use Authorization (EUA).  This EUA will remain in effect (meaning this test can be used) for the duration of the COVID-19 declaration under Section 564(b)(1) of the Act, 21 U.S.C. section  360bbb-3(b)(1), unless the authorization is terminated or revoked sooner. Performed at [facility], [place] [address], [place], [place] [address]   Blood Culture (routine x 2)     Status: None (Preliminary result)   Collection Time: 01/10/19  1:03 PM  Result Value Ref Range Status   Specimen Description BLOOD LEFT HAND  Final   Special Requests   Final    BOTTLES DRAWN AEROBIC AND ANAEROBIC Blood Culture results may not be optimal due to an inadequate volume of blood received in culture bottles   Culture   Final    NO GROWTH < 24 HOURS Performed at [facility], [place] [address], [place], [place] [address]    Report Status PENDING  Incomplete  Blood Culture (routine x 2)     Status: None (Preliminary result)   Collection Time: 01/10/19  1:05 PM  Result Value Ref Range Status   Specimen Description BLOOD RIGHT ANTECUBITAL  Final   Special Requests   Final    BOTTLES DRAWN AEROBIC AND ANAEROBIC Blood Culture adequate volume   Culture   Final    NO GROWTH < 24 HOURS Performed at [facility], [place] [address], [place], [place] [address]    Report Status PENDING  Incomplete         Radiology Studies: Ct Angio Chest Pe W/cm &/or Wo Cm  Result Date: 01/10/2019 CLINICAL DATA:  Fever and cough. Suspect pulmonary embolism. Recent femoral tibial bypass graft 01/08/2019 EXAM: CT ANGIOGRAPHY CHEST WITH CONTRAST TECHNIQUE: Multidetector CT imaging of the chest was performed using the standard protocol during bolus administration of intravenous contrast.  Multiplanar CT image reconstructions and MIPs were obtained to evaluate the vascular anatomy. CONTRAST:  26mL OMNIPAQUE IOHEXOL 300 MG/ML  SOLN COMPARISON:  Chest 01/10/2019 FINDINGS: Cardiovascular: Negative for pulmonary embolism. Pulmonary arteries normal in caliber with normal enhancement. Mild atherosclerotic disease in the aortic arch without aneurysm or dissection. Ascending aorta 39 mm in diameter. Moderate to extensive coronary artery calcification. Heart size normal without pericardial effusion Mediastinum/Nodes: Negative for mass or adenopathy Lungs/Pleura: Mild dependent atelectasis in the right posterior lung base. Negative for pneumonia or effusion. No mass or lung nodule identified. Upper Abdomen: Layering density in the gallbladder may be vicarious excretion of contrast from prior angiogram on 01/08/2019. Small adrenal adenoma on the left 15 mm. Musculoskeletal: No acute abnormality. Review of the MIP images confirms the above findings. IMPRESSION: Negative for pulmonary embolism.  No acute abnormality in the chest. Atherosclerotic aorta. Moderate to extensive coronary calcification. Electronically Signed   By: Franchot Gallo M.D.   On: 01/10/2019 16:32   Dg Chest Port 1 View  Result Date: 01/10/2019 CLINICAL DATA:  Read toes.  Fever and cough. EXAM: PORTABLE CHEST 1 VIEW COMPARISON:  12/05/2018 FINDINGS: Normal heart size. Lungs clear. No pneumothorax. No pleural effusion. IMPRESSION: No active disease. Electronically Signed   By: Marybelle Killings M.D.   On: 01/10/2019 13:04        Scheduled Meds: . aspirin EC  81 mg Oral Daily  . atorvastatin  40 mg Oral q1800  . enoxaparin (LOVENOX) injection  40 mg Subcutaneous Q24H  . methylPREDNISolone (SOLU-MEDROL) injection  60 mg Intravenous Q6H  . metoprolol tartrate  25 mg Oral BID  . mometasone-formoterol  2 puff Inhalation Daily  . umeclidinium bromide  1 puff Inhalation Daily   Continuous Infusions: . piperacillin-tazobactam (ZOSYN)  IV  3.375 g (01/11/19 1006)  . vancomycin 2,000 mg (01/11/19 0123)  LOS: 1 day     Georgette Shell, MD  If 7PM-7AM, please contact night-coverage [email or web address] Password [facility] 01/11/2019, 10:13 AM

## 2019-01-11 NOTE — Progress Notes (Signed)
New Admission Note:   Arrival Method:  Via stretcher from ED  Mental Orientation:  A & O x 4 at time of adm but at approx 0545, AMS with pulling PIV out Telemetry: Placed Tele #[id number] Assessment: Completed Skin:  See Skin Assessment IV:  See Assessment Pain:   Denies Tubes:  None Safety Measures: Safety Fall Prevention Plan has been given, discussed and signed Admission: Completed [address] [place] Orientation: Patient has been orientated to the room, unit and staff.  Family:  None at bedside  Orders have been reviewed and implemented. Will continue to monitor the patient. Call light has been placed within reach and bed alarm has been activated.   Earleen Reaper RN- BC, [facility] Phone number: [phone]

## 2019-01-12 MED ORDER — METHYLPREDNISOLONE SODIUM SUCC 40 MG IJ SOLR
40.0000 mg | Freq: Every day | INTRAMUSCULAR | Status: DC
  Administered 2019-01-13: 40 mg via INTRAVENOUS
  Filled 2019-01-12 (×3): qty 1

## 2019-01-12 MED ORDER — BISACODYL 5 MG PO TBEC
10.0000 mg | DELAYED_RELEASE_TABLET | Freq: Once | ORAL | Status: AC
  Administered 2019-01-12: 10 mg via ORAL
  Filled 2019-01-12: qty 2

## 2019-01-12 MED ORDER — ALUM & MAG HYDROXIDE-SIMETH 200-200-20 MG/5ML PO SUSP
15.0000 mL | ORAL | Status: DC | PRN
  Administered 2019-01-12: 15 mL via ORAL
  Filled 2019-01-12: qty 30

## 2019-01-12 MED ORDER — LOSARTAN POTASSIUM 50 MG PO TABS
50.0000 mg | ORAL_TABLET | Freq: Every day | ORAL | Status: DC
  Administered 2019-01-12 – 2019-01-13 (×2): 50 mg via ORAL
  Filled 2019-01-12 (×2): qty 1

## 2019-01-12 MED ORDER — SENNOSIDES-DOCUSATE SODIUM 8.6-50 MG PO TABS
3.0000 | ORAL_TABLET | Freq: Once | ORAL | Status: AC
  Administered 2019-01-12: 3 via ORAL
  Filled 2019-01-12: qty 3

## 2019-01-12 MED ORDER — HYDRALAZINE HCL 20 MG/ML IJ SOLN
10.0000 mg | INTRAMUSCULAR | Status: DC | PRN
  Administered 2019-01-12: 10 mg via INTRAVENOUS
  Filled 2019-01-12: qty 1

## 2019-01-12 MED ORDER — HYDRALAZINE HCL 20 MG/ML IJ SOLN
10.0000 mg | Freq: Once | INTRAMUSCULAR | Status: AC
  Administered 2019-01-12: 10 mg via INTRAVENOUS
  Filled 2019-01-12: qty 1

## 2019-01-12 NOTE — Progress Notes (Signed)
PROGRESS NOTE    MACARTHUR LORUSSO  XBJ:[id number] DOB: 1951-01-28 DOA: 01/10/2019 PCP: Patient, No Pcp Per  Brief Narrative: 68 y.o.malewith medical history significant ofCOPD, CAD, alcohol abuse last drink was a year ago, tobacco abuse continues to smoke paroxysmal atrial fibrillation, peripheral vascular disease status post right lower extremity revascularization right femoral endarterectomy 01/08/2019 by Dr. Doren Custard. He was discharged home 01/09/2019. He is admitted with complaints of generalized weakness and hypoxia shortness of breath and cough. Patient does not have oxygen at home. His saturation was in the 80s in the emergency room. He had complains of subjective fever with generalized tremor starting last night. He denies any nausea vomiting abdominal pain diarrhea chest pain headaches. He is awake alert and oriented. He reported that approximately 3 years ago he had shaking like this and he had come to the hospital and was told he probably have seizures and was sent home on the same day.  In ed receivedalbuterol, Vanco and Zosyn. CT angiogram negative for PE extensive coronary calcification no evidence of consolidation or effusion or mass. Labs sodium 136 potassium 3.9 BUN 18 creatinine 1.09. Normal LFTs. LDH 155 ferritin 141 CRP 9.1 lactic acid 1.2 procalcitonin less than 2.10. White count 11.3 hemoglobin 11.5 platelet count 183, d-dimer 1.10. He was 80% on room air upon arrival to ER he was placed on a nonrebreather mask  Assessment & Plan:   Active Problems:   PVD (peripheral vascular disease) (HCC)   COPD (chronic obstructive pulmonary disease) (HCC)   Essential hypertension   Coarse tremors  #1 acute hypoxic respiratory failure  secondary to COPD exacerbation patient with recent right lower extremity bypass surgery and history of COPD patient admitted with shortness of breath cough subjective fever. CT of the chest does not show any evidence of PE or pneumonia or pleural  effusion. Patient has been wheezing. I will treat him with steroids inhalers COVID-19 negative COVID-19 ruled out.  flu panel negative.  Follow-up blood cultures. Continue Spiriva Dulera and Ventolin.  Patient is still dependent on 2.5 L of oxygen.  Titrate and taper oxygen to DC.  Prior to admission he was not on oxygen at home.    Continue IV steroids and taper as patient is able to tolerate.  Patient is at high risk for respiratory and cardiac decompensation from hypoxia.  Patient became hypoxic to 87% on room air with ambulation today.  Continue IV steroids IV antibiotics incentive spirometry hopefully we can  taper his oxygen and discharge him tomorrow.  #2new onset tremor has been resolved!!  #3 hypertension continue metoprolol restart Cozaar .hydralazine PRN   #4 PVD status post right lower extremity revascularization discharged from the hospital for 24 2020. By Dr. Scot Dock.  Open wound on the right pinky toe scabbed will discuss with vascular.  DVT prophylaxis:Lovenox Code Status:Full code Family Communication:None Disposition Plan:Pending clinical improvement Consults called:None      Estimated body mass index is 27.24 kg/m as calculated from the following:   Height as of this encounter: 5\' 11"  (1.803 m).   Weight as of this encounter: 88.6 kg.   Subjective:  Resting in bed on 2 L of oxygen still short of breath with dyspnea on exertion and hypoxia on ambulation Objective: Vitals:   01/12/19 0844 01/12/19 0858 01/12/19 0916 01/12/19 0956  BP: (!) 194/86 (!) 176/82 (!) 168/74 (!) 179/82  Pulse: 94 93 92 93  Resp:   (!) 22   Temp:  98.1 F (36.7 C) 98.4 F (36.9 C)  TempSrc:  Oral Oral   SpO2: (!) 89% (!) 89% (!) 89%   Weight:      Height:        Intake/Output Summary (Last 24 hours) at 01/12/2019 1202 Last data filed at 01/12/2019 1000 Gross per 24 hour  Intake 1600 ml  Output 950 ml  Net 650 ml   Filed Weights   01/10/19 1205 01/10/19 2042  01/11/19 2041  Weight: 85.4 kg 88.7 kg 88.6 kg    Examination:  General exam: Appears calm and comfortable  Respiratory system diminished breath sounds at the bases with diffuse wheezing to auscultation. Respiratory effort normal. Cardiovascular system: S1 & S2 heard, RRR. No JVD, murmurs, rubs, gallops or clicks. No pedal edema. Gastrointestinal system: Abdomen is nondistended, soft and nontender. No organomegaly or masses felt. Normal bowel sounds heard. Central nervous system: Alert and oriented. No focal neurological deficits. Extremities: Symmetric 5 x 5 power. Skin: No rashes, lesions or ulcers Psychiatry: Judgement and insight appear normal. Mood & affect appropriate.     Data Reviewed: I have personally reviewed following labs and imaging studies  CBC: Recent Labs  Lab 01/05/19 1502  01/07/19 0522 01/08/19 0255 01/09/19 0343 01/10/19 1230 01/11/19 0508  WBC 8.2   < > 9.7 9.9 12.3* 11.3* 10.6*  NEUTROABS 6.3  --   --   --   --  8.3* 9.8*  HGB 14.9   < > 13.8 14.0 12.1* 11.5* 11.4*  HCT 47.1   < > 42.6 43.6 39.0 37.1* 36.4*  MCV 99.4   < > 96.6 96.7 99.2 101.6* 100.0  PLT 249   < > 239 227 211 183 186   < > = values in this interval not displayed.   Basic Metabolic Panel: Recent Labs  Lab 01/06/19 0342 01/08/19 0255 01/09/19 0343 01/10/19 1230 01/11/19 0508  NA 137 137 136 136 136  K 4.0 4.0 4.5 3.9 4.5  CL 97* 100 101 100 99  CO2 30 27 27 26 25   GLUCOSE 115* 110* 127* 115* 140*  BUN 9 9 10 18 11   CREATININE 0.92 0.89 0.94 1.09 0.90  CALCIUM 9.0 8.9 8.4* 7.9* 8.5*   GFR: Estimated Creatinine Clearance: 86 mL/min (by C-G formula based on SCr of 0.9 mg/dL). Liver Function Tests: Recent Labs  Lab 01/06/19 0342 01/10/19 1230  AST 24 26  ALT 16 16  ALKPHOS 56 48  BILITOT 0.9 0.8  PROT 6.1* 5.3*  ALBUMIN 3.4* 2.8*   No results for input(s): LIPASE, AMYLASE in the last 168 hours. No results for input(s): AMMONIA in the last 168 hours. Coagulation  Profile: Recent Labs  Lab 01/05/19 1502  INR 1.0   Cardiac Enzymes: Recent Labs  Lab 01/10/19 1738 01/10/19 2344 01/11/19 0508 01/11/19 0925  TROPONINI 0.08* 0.06* 0.05* 0.04*   BNP (last 3 results) No results for input(s): PROBNP in the last 8760 hours. HbA1C: No results for input(s): HGBA1C in the last 72 hours. CBG: No results for input(s): GLUCAP in the last 168 hours. Lipid Profile: Recent Labs    01/10/19 1230  TRIG 93   Thyroid Function Tests: No results for input(s): TSH, T4TOTAL, FREET4, T3FREE, THYROIDAB in the last 72 hours. Anemia Panel: Recent Labs    01/10/19 1230  FERRITIN 141   Sepsis Labs: Recent Labs  Lab 01/10/19 1230  PROCALCITON <0.10  LATICACIDVEN 1.2    Recent Results (from the past 240 hour(s))  MRSA PCR Screening     Status: None   Collection  Time: 01/06/19  2:14 AM  Result Value Ref Range Status   MRSA by PCR NEGATIVE NEGATIVE Final    Comment:        The GeneXpert MRSA Assay (FDA approved for NASAL specimens only), is one component of a comprehensive MRSA colonization surveillance program. It is not intended to diagnose MRSA infection nor to guide or monitor treatment for MRSA infections. Performed at [facility], [place] [address] [place], [place] [address]   Surgical pcr screen     Status: None   Collection Time: 01/08/19 10:40 AM  Result Value Ref Range Status   MRSA, PCR NEGATIVE NEGATIVE Final   Staphylococcus aureus NEGATIVE NEGATIVE Final    Comment: (NOTE) The Xpert SA Assay (FDA approved for NASAL specimens in patients 71 years of age and older), is one component of a comprehensive surveillance program. It is not intended to diagnose infection nor to guide or monitor treatment. Performed at [facility], [place] [address] [place], [place] [address]   SARS Coronavirus 2 [facility] order, Performed in [facility] hospital lab)     Status: None   Collection Time: 01/10/19 12:30 PM  Result Value Ref  Range Status   SARS Coronavirus 2 NEGATIVE NEGATIVE Final    Comment: (NOTE) If result is NEGATIVE SARS-CoV-2 target nucleic acids are NOT DETECTED. The SARS-CoV-2 RNA is generally detectable in upper and lower  respiratory specimens during the acute phase of infection. The lowest  concentration of SARS-CoV-2 viral copies this assay can detect is 250  copies / mL. A negative result does not preclude SARS-CoV-2 infection  and should not be used as the sole basis for treatment or other  patient management decisions.  A negative result may occur with  improper specimen collection / handling, submission of specimen other  than nasopharyngeal swab, presence of viral mutation(s) within the  areas targeted by this assay, and inadequate number of viral copies  (<250 copies / mL). A negative result must be combined with clinical  observations, patient history, and epidemiological information. If result is POSITIVE SARS-CoV-2 target nucleic acids are DETECTED. The SARS-CoV-2 RNA is generally detectable in upper and lower  respiratory specimens dur ing the acute phase of infection.  Positive  results are indicative of active infection with SARS-CoV-2.  Clinical  correlation with patient history and other diagnostic information is  necessary to determine patient infection status.  Positive results do  not rule out bacterial infection or co-infection with other viruses. If result is PRESUMPTIVE POSTIVE SARS-CoV-2 nucleic acids MAY BE PRESENT.   A presumptive positive result was obtained on the submitted specimen  and confirmed on repeat testing.  While 2019 novel coronavirus  (SARS-CoV-2) nucleic acids may be present in the submitted sample  additional confirmatory testing may be necessary for epidemiological  and / or clinical management purposes  to differentiate between  SARS-CoV-2 and other Sarbecovirus currently known to infect humans.  If clinically indicated additional testing with an  alternate test  methodology [phone]) is advised. The SARS-CoV-2 RNA is generally  detectable in upper and lower respiratory sp ecimens during the acute  phase of infection. The expected result is Negative. Fact Sheet for Patients:  StrictlyIdeas.no Fact Sheet for Healthcare Providers: BankingDealers.co.za This test is not yet approved or cleared by the [place] FDA and has been authorized for detection and/or diagnosis of SARS-CoV-2 by FDA under an Emergency Use Authorization (EUA).  This EUA will remain in effect (meaning this test can be  used) for the duration of the COVID-19 declaration under Section 564(b)(1) of the Act, 21 U.S.C. section 360bbb-3(b)(1), unless the authorization is terminated or revoked sooner. Performed at [facility], [place] [address], [place], [place] [address]   Blood Culture (routine x 2)     Status: None (Preliminary result)   Collection Time: 01/10/19  1:03 PM  Result Value Ref Range Status   Specimen Description BLOOD LEFT HAND  Final   Special Requests   Final    BOTTLES DRAWN AEROBIC AND ANAEROBIC Blood Culture results may not be optimal due to an inadequate volume of blood received in culture bottles   Culture   Final    NO GROWTH 2 DAYS Performed at [facility], [place] [address] [place], [place] [address]    Report Status PENDING  Incomplete  Blood Culture (routine x 2)     Status: None (Preliminary result)   Collection Time: 01/10/19  1:05 PM  Result Value Ref Range Status   Specimen Description BLOOD RIGHT ANTECUBITAL  Final   Special Requests   Final    BOTTLES DRAWN AEROBIC AND ANAEROBIC Blood Culture adequate volume   Culture   Final    NO GROWTH 2 DAYS Performed at [facility], [place] [address], [place], [place] [address]    Report Status PENDING  Incomplete         Radiology Studies: Ct Angio Chest Pe W/cm &/or Wo Cm  Result Date: 01/10/2019 CLINICAL DATA:   Fever and cough. Suspect pulmonary embolism. Recent femoral tibial bypass graft 01/08/2019 EXAM: CT ANGIOGRAPHY CHEST WITH CONTRAST TECHNIQUE: Multidetector CT imaging of the chest was performed using the standard protocol during bolus administration of intravenous contrast. Multiplanar CT image reconstructions and MIPs were obtained to evaluate the vascular anatomy. CONTRAST:  54mL OMNIPAQUE IOHEXOL 300 MG/ML  SOLN COMPARISON:  Chest 01/10/2019 FINDINGS: Cardiovascular: Negative for pulmonary embolism. Pulmonary arteries normal in caliber with normal enhancement. Mild atherosclerotic disease in the aortic arch without aneurysm or dissection. Ascending aorta 39 mm in diameter. Moderate to extensive coronary artery calcification. Heart size normal without pericardial effusion Mediastinum/Nodes: Negative for mass or adenopathy Lungs/Pleura: Mild dependent atelectasis in the right posterior lung base. Negative for pneumonia or effusion. No mass or lung nodule identified. Upper Abdomen: Layering density in the gallbladder may be vicarious excretion of contrast from prior angiogram on 01/08/2019. Small adrenal adenoma on the left 15 mm. Musculoskeletal: No acute abnormality. Review of the MIP images confirms the above findings. IMPRESSION: Negative for pulmonary embolism.  No acute abnormality in the chest. Atherosclerotic aorta. Moderate to extensive coronary calcification. Electronically Signed   By: Franchot Gallo M.D.   On: 01/10/2019 16:32   Dg Chest Port 1 View  Result Date: 01/10/2019 CLINICAL DATA:  Read toes.  Fever and cough. EXAM: PORTABLE CHEST 1 VIEW COMPARISON:  12/05/2018 FINDINGS: Normal heart size. Lungs clear. No pneumothorax. No pleural effusion. IMPRESSION: No active disease. Electronically Signed   By: Marybelle Killings M.D.   On: 01/10/2019 13:04        Scheduled Meds: . aspirin EC  81 mg Oral Daily  . atorvastatin  40 mg Oral q1800  . enoxaparin (LOVENOX) injection  40 mg Subcutaneous Q24H   . losartan  50 mg Oral Daily  . [START ON 01/13/2019] methylPREDNISolone (SOLU-MEDROL) injection  40 mg Intravenous Daily  . metoprolol tartrate  25 mg Oral BID  . mometasone-formoterol  2 puff Inhalation Daily  . umeclidinium bromide  1 puff Inhalation Daily   Continuous Infusions: . piperacillin-tazobactam (ZOSYN)  IV 3.375 g (01/12/19 0542)  . vancomycin 2,000 mg (01/12/19 0141)     LOS: 2 days      Georgette Shell, MD [facility]  If 7PM-7AM, please contact night-coverage [email or web address] Password [facility] 01/12/2019, 12:02 PM

## 2019-01-12 NOTE — Progress Notes (Signed)
   01/12/19 0522  Provider Notification  Provider Name/Title Blount NP  Date Provider Notified 01/12/19  Time Provider Notified 0522  Notification Type Page  Notification Reason Change in status (B/P 214/99)  Response See new orders  Date of Provider Response 01/12/19   Order for IV Hydralazine received and will be implemented.  Will continue to monitor patient.  Earleen Reaper RN

## 2019-01-12 NOTE — Progress Notes (Signed)
   01/11/19 2251  Provider Notification  Provider Name/Title Blount NP  Date Provider Notified 01/11/19  Time Provider Notified 2251  Notification Type Page  Notification Reason Requested by patient/family (Pt would like stool softener)  Response See new orders  Date of Provider Response 01/11/19   Orders received and given to patient as 2235.  Patient educated on the medication.  Will continue to monitor patient.  Earleen Reaper RN

## 2019-01-12 NOTE — Progress Notes (Signed)
SATURATION QUALIFICATIONS: (This note is used to comply with regulatory documentation for home oxygen)  Patient Saturations on Room Air at Rest = 90%  Patient Saturations on Room Air while Ambulating = 89%  Patient Saturations on 0 Liters of oxygen while Ambulating = 88 %  Please briefly explain why patient needs home oxygen: When pt was situated back in the bed O2 dropped to 87% on RA and was winded, O2 at 2.4 L via [place] was applied and O2 saturation back to 92%.  Paulla Fore, RN

## 2019-01-12 NOTE — Progress Notes (Signed)
Notified MD Rodena Piety that pt c/o aching chest pain 8/10. Pt stated that pain started after eating. BP 194/86 HR 95 and NSR on tele monitor. No PRN for HTN or for acid reflux.   Paulla Fore, RN

## 2019-01-12 NOTE — Progress Notes (Addendum)
  Progress Note    01/12/2019 8:39 AM * No surgery found *  Subjective: Patient believes sensation in R foot has improved since bypass surgery   Vitals:   01/12/19 0505 01/12/19 0801  BP: (!) 214/99   Pulse: 72   Resp: 17 18  Temp: 98.5 F (36.9 C)   SpO2: 95% 94%   Physical Exam: Lungs:  Non labored on  Incisions:  R groin incision c/d/i; RLE incisions all healing well Extremities:  Palpable graft pulse at lateral lower leg incision Neurologic: A&O  CBC    Component Value Date/Time   WBC 10.6 (H) 01/11/2019 0508   RBC 3.64 (L) 01/11/2019 0508   HGB 11.4 (L) 01/11/2019 0508   HCT 36.4 (L) 01/11/2019 0508   PLT 186 01/11/2019 0508   MCV 100.0 01/11/2019 0508   MCH 31.3 01/11/2019 0508   MCHC 31.3 01/11/2019 0508   RDW 13.1 01/11/2019 0508   LYMPHSABS 0.5 (L) 01/11/2019 0508   MONOABS 0.2 01/11/2019 0508   EOSABS 0.0 01/11/2019 0508   BASOSABS 0.0 01/11/2019 0508    BMET    Component Value Date/Time   NA 136 01/11/2019 0508   K 4.5 01/11/2019 0508   CL 99 01/11/2019 0508   CO2 25 01/11/2019 0508   GLUCOSE 140 (H) 01/11/2019 0508   BUN 11 01/11/2019 0508   CREATININE 0.90 01/11/2019 0508   CALCIUM 8.5 (L) 01/11/2019 0508   GFRNONAA >60 01/11/2019 0508   GFRAA >60 01/11/2019 0508    INR    Component Value Date/Time   INR 1.0 01/05/2019 1502     Intake/Output Summary (Last 24 hours) at 01/12/2019 0839 Last data filed at 01/12/2019 0600 Gross per 24 hour  Intake 1380 ml  Output 550 ml  Net 830 ml     Assessment/Plan:  68 y.o. male is s/p R CFA endart with fem to ATA bypass with vein   Patent bypass with palpable graft pulse at lateral lower leg Incisions all healing well Ok for discharge from vascular standpoint with follow up in about 2 weeks as scheduled   Dagoberto Ligas, PA-C [facility] [phone] 01/12/2019 8:39 AM  ADDENDUM 01/12/19 Vascular was called to re-evaluate tip of R fifth toe.  There is a small, dry,  eschar.  This is not infected nor does this look at all concerning.  Patient has a faintly palpable DP pulse.  This will likely heal with time.  [place] for discharge home.  He will follow up in office in about 2 weeks.  The patient will have his wife evaluate his toe daily and call if it worsens.

## 2019-01-13 ENCOUNTER — Other Ambulatory Visit: Payer: Self-pay | Admitting: Family Medicine

## 2019-01-13 DIAGNOSIS — G252 Other specified forms of tremor: Secondary | ICD-10-CM

## 2019-01-13 LAB — RSV(RESPIRATORY SYNCYTIAL VIRUS) AB, BLOOD: RSV Ab: 1:32 {titer} — ABNORMAL HIGH

## 2019-01-13 MED ORDER — AMOXICILLIN-POT CLAVULANATE 875-125 MG PO TABS: 1.0000 | ORAL_TABLET | Freq: Two times a day (BID) | ORAL | 0 refills | Status: AC

## 2019-01-13 MED ORDER — HYDRALAZINE HCL 20 MG/ML IJ SOLN
10.0000 mg | INTRAMUSCULAR | Status: DC | PRN
  Administered 2019-01-13: 10 mg via INTRAVENOUS
  Filled 2019-01-13: qty 1

## 2019-01-13 MED ORDER — PREDNISONE 10 MG PO TABS: ORAL_TABLET | ORAL | 0 refills | Status: DC

## 2019-01-13 NOTE — Discharge Summary (Signed)
Physician Discharge Summary  Omar Singh KGM:[id number] DOB: 04/06/1951 DOA: 01/10/2019  PCP: Patient, No Pcp Per  Admit date: 01/10/2019 Discharge date: 01/13/2019  Admitted From: Home Disposition: Home  Recommendations for Outpatient Follow-up:  1. Follow up with PCP in 1-2 weeks, patient does not have a PCP wife is going to find him a PCP. 2. Please obtain BMP/CBC in one week 3. Please follow up with vascular in 2 weeks  Home Health none Equipment/Devices none Discharge Condition: Stable and improved CODE STATUS full code Diet recommendation: Cardiac Brief/Interim Summary:67 y.o.malewith medical history significant ofCOPD, CAD, alcohol abuse last drink was a year ago, tobacco abuse continues to smoke paroxysmal atrial fibrillation, peripheral vascular disease status post right lower extremity revascularization right femoral endarterectomy 01/08/2019 by Dr. Doren Custard. He was discharged home 01/09/2019. He is admitted with complaints of generalized weakness and hypoxia shortness of breath and cough. Patient does not have oxygen at home. His saturation was in the 80s in the emergency room. He had complains of subjective fever with generalized tremor starting last night. He denies any nausea vomiting abdominal pain diarrhea chest pain headaches. He is awake alert and oriented. He reported that approximately 3 years ago he had shaking like this and he had come to the hospital and was told he probably have seizures and was sent home on the same day.  In ed receivedalbuterol, Vanco and Zosyn. CT angiogram negative for PE extensive coronary calcification no evidence of consolidation or effusion or mass. Labs sodium 136 potassium 3.9 BUN 18 creatinine 1.09. Normal LFTs. LDH 155 ferritin 141 CRP 9.1 lactic acid 1.2 procalcitonin less than 2.10. White count 11.3 hemoglobin 11.5 platelet count 183, d-dimer 1.10. He was 80% on room air upon arrival to ER he was placed on a nonrebreather  mask  Discharge Diagnoses:  Active Problems:   PVD (peripheral vascular disease) (HCC)   COPD (chronic obstructive pulmonary disease) (HCC)   Essential hypertension   Coarse tremors   #1 acute hypoxic respiratory failure secondary to COPD exacerbation patient with recent right lower extremity bypass surgery and history of COPD patient admitted with shortness of breath cough subjective fever. CT of the chest does not show any evidence of PE or pneumonia or pleural effusion.  Patient was treated with IV steroids inhalers and IV antibiotics Vanco and Zosyn.  COVID negative.  COVID-19 ruled out. flu panel negative.Follow-upblood cultures no growth. Patient ambulated prior to discharge his saturation was 89 to 92% with ambulation with no new complaints patiencontinued to feel well.  I will discharge him on tapering dose of prednisone and Augmentin for 2 more days.  #2new onset tremorhas been resolved!!   #3 hypertension continue metoprolol restart Cozaar  #4 PVD status post right lower extremity revascularization discharged from the hospital for 24 2020. By Dr. Scot Dock.  Open wound on the right pinky toe scabbed will discuss with vascular.  Patient followed by vascular during the hospital stay.  Estimated body mass index is 27.8 kg/m as calculated from the following:   Height as of this encounter: 5\' 11"  (1.803 m).   Weight as of this encounter: 90.4 kg.  Discharge Instructions  Discharge Instructions    Call MD for:  difficulty breathing, headache or visual disturbances   Complete by:  As directed    Call MD for:  persistant nausea and vomiting   Complete by:  As directed    Call MD for:  temperature >100.4   Complete by:  As directed  Diet - low sodium heart healthy   Complete by:  As directed    Increase activity slowly   Complete by:  As directed      Allergies as of 01/13/2019   No Known Allergies     Medication List    STOP taking these medications    albuterol 108 (90 Base) MCG/ACT inhaler Commonly known as:  VENTOLIN HFA   tiotropium 18 MCG inhalation capsule Commonly known as:  Spiriva HandiHaler     TAKE these medications   amoxicillin-clavulanate 875-125 MG tablet Commonly known as:  Augmentin Take 1 tablet by mouth 2 (two) times daily for 3 days.   aspirin 81 MG EC tablet Take 1 tablet (81 mg total) by mouth daily.   atorvastatin 40 MG tablet Commonly known as:  LIPITOR Take 1 tablet (40 mg total) by mouth daily at 6 PM.   diphenhydramine-acetaminophen 25-500 MG Tabs tablet Commonly known as:  TYLENOL PM Take 2 tablets by mouth at bedtime.   losartan 50 MG tablet Commonly known as:  COZAAR Take 1 tablet (50 mg total) by mouth daily.   metoprolol tartrate 25 MG tablet Commonly known as:  LOPRESSOR Take 1 tablet (25 mg total) by mouth 2 (two) times daily for 30 days.   mometasone-formoterol 200-5 MCG/ACT Aero Commonly known as:  DULERA Inhale 2 puffs into the lungs daily.   oxyCODONE 5 MG immediate release tablet Commonly known as:  Oxy IR/ROXICODONE Take 1 tablet (5 mg total) by mouth every 6 (six) hours as needed for moderate pain.   predniSONE 10 MG tablet Commonly known as:  DELTASONE Take 30 mg daily for first 3 days then 20 mg daily for the next 3 days then 10 mg daily till done.      Follow-up Information    Marty Heck, MD Follow up.   Specialty:  Vascular Surgery Contact information: [address] [place] [address] [phone]          No Known Allergies  Consultations: Vascular surgery  Procedures/Studies: Ct Angio Chest Pe W/cm &/or Wo Cm  Result Date: 01/10/2019 CLINICAL DATA:  Fever and cough. Suspect pulmonary embolism. Recent femoral tibial bypass graft 01/08/2019 EXAM: CT ANGIOGRAPHY CHEST WITH CONTRAST TECHNIQUE: Multidetector CT imaging of the chest was performed using the standard protocol during bolus administration of intravenous contrast. Multiplanar CT image  reconstructions and MIPs were obtained to evaluate the vascular anatomy. CONTRAST:  80mL OMNIPAQUE IOHEXOL 300 MG/ML  SOLN COMPARISON:  Chest 01/10/2019 FINDINGS: Cardiovascular: Negative for pulmonary embolism. Pulmonary arteries normal in caliber with normal enhancement. Mild atherosclerotic disease in the aortic arch without aneurysm or dissection. Ascending aorta 39 mm in diameter. Moderate to extensive coronary artery calcification. Heart size normal without pericardial effusion Mediastinum/Nodes: Negative for mass or adenopathy Lungs/Pleura: Mild dependent atelectasis in the right posterior lung base. Negative for pneumonia or effusion. No mass or lung nodule identified. Upper Abdomen: Layering density in the gallbladder may be vicarious excretion of contrast from prior angiogram on 01/08/2019. Small adrenal adenoma on the left 15 mm. Musculoskeletal: No acute abnormality. Review of the MIP images confirms the above findings. IMPRESSION: Negative for pulmonary embolism.  No acute abnormality in the chest. Atherosclerotic aorta. Moderate to extensive coronary calcification. Electronically Signed   By: Franchot Gallo M.D.   On: 01/10/2019 16:32   Dg Chest Port 1 View  Result Date: 01/10/2019 CLINICAL DATA:  Read toes.  Fever and cough. EXAM: PORTABLE CHEST 1 VIEW COMPARISON:  12/05/2018 FINDINGS: Normal  heart size. Lungs clear. No pneumothorax. No pleural effusion. IMPRESSION: No active disease. Electronically Signed   By: Marybelle Killings M.D.   On: 01/10/2019 13:04   Vas [place] Burnard Bunting With/wo Tbi  Result Date: 01/06/2019 LOWER EXTREMITY DOPPLER STUDY Indications: Rest pain, and peripheral artery disease.  Performing Technologist: June Leap RDMS, RVT  Examination Guidelines: A complete evaluation includes at minimum, Doppler waveform signals and systolic blood pressure reading at the level of bilateral brachial, anterior tibial, and posterior tibial arteries, when vessel segments are accessible. Bilateral  testing is considered an integral part of a complete examination. Photoelectric Plethysmograph (PPG) waveforms and toe systolic pressure readings are included as required and additional duplex testing as needed. Limited examinations for reoccurring indications may be performed as noted.  ABI Findings: +---------+------------------+-----+--------+--------+ Right    Rt Pressure (mmHg)IndexWaveformComment  +---------+------------------+-----+--------+--------+ Brachial 177                                     +---------+------------------+-----+--------+--------+ ATA                             absent           +---------+------------------+-----+--------+--------+ PTA                             absent           +---------+------------------+-----+--------+--------+ DP                              absent           +---------+------------------+-----+--------+--------+ Great Toe                       absent           +---------+------------------+-----+--------+--------+ +----+------------------+-----+----------+-------+ LeftLt Pressure (mmHg)IndexWaveform  Comment +----+------------------+-----+----------+-------+ ATA 73                0.41 monophasic        +----+------------------+-----+----------+-------+ PTA 120               0.68 monophasic        +----+------------------+-----+----------+-------+  Summary: Right: Absent waveforms of right pedal arteries. Absent PPG waveform of great toe. Left: Resting left ankle-brachial index indicates moderate left lower extremity arterial disease.  *See table(s) above for measurements and observations.  Electronically signed by Deitra Mayo MD on 01/06/2019 at 10:55:46 AM.   Final    Vas [place] Lower Extremity Saphenous Vein Mapping  Result Date: 01/07/2019 LOWER EXTREMITY VEIN MAPPING Indications: PAD  Performing Technologist: Abram Sander RVS  Examination Guidelines: A complete evaluation includes B-mode imaging,  spectral Doppler, color Doppler, and power Doppler as needed of all accessible portions of each vessel. Bilateral testing is considered an integral part of a complete examination. Limited examinations for reoccurring indications may be performed as noted. +---------------+----------+---------------------+--------------+--------------+   RT Diameter      RT             GSV          LT Diameter   LT Findings        (cm)       Findings                           (  cm)                    +---------------+----------+---------------------+--------------+--------------+      0.77                   Saphenofemoral         0.62                                                   Junction                                    +---------------+----------+---------------------+--------------+--------------+      0.40                   Proximal thigh         0.35                    +---------------+----------+---------------------+--------------+--------------+      0.29      branching       Mid thigh           0.24                    +---------------+----------+---------------------+--------------+--------------+      0.39                    Distal thigh          0.19                    +---------------+----------+---------------------+--------------+--------------+      0.27                        Knee              0.23                    +---------------+----------+---------------------+--------------+--------------+      0.31                      Prox calf           0.28                    +---------------+----------+---------------------+--------------+--------------+      0.15                      Mid calf            0.23                    +---------------+----------+---------------------+--------------+--------------+      0.24                     Distal calf          0.19                     +---------------+----------+---------------------+--------------+--------------+      0.31                        Ankle  not visualized +---------------+----------+---------------------+--------------+--------------+ +----------------+-----------+---------------+----------------+--------------+ RT diameter (cm)RT Findings      SSV      LT Diameter (cm) LT Findings   +----------------+-----------+---------------+----------------+--------------+       0.40                 Popliteal fossa      0.21                     +----------------+-----------+---------------+----------------+--------------+       0.23                  Proximal calf       0.19                     +----------------+-----------+---------------+----------------+--------------+       0.15                    Mid calf                    not visualized +----------------+-----------+---------------+----------------+--------------+       0.15                   Distal calf                  not visualized +----------------+-----------+---------------+----------------+--------------+ Diagnosing physician: Curt Jews MD Electronically signed by Curt Jews MD on 01/07/2019 at 4:03:48 PM.    Final    Dg Angio 1v Ext Uni Right  Result Date: 01/08/2019 CLINICAL DATA:  Critical limb ischemia of right lower extremity with rest pain. Femoral to anterior tibial artery bypass graft surgery. EXAM: RIGHT ANG/EXT/UNI/ OR COMPARISON:  None. FINDINGS: Intraoperative imaging demonstrates opacification the distal end of a right lower extremity bypass graft with anastomosis to the proximal anterior tibial artery. Patent anterior tibial runoff is visible to the level of the ankle mortise. There is some reflux via the tibioperoneal trunk into the popliteal artery below the knee. IMPRESSION: Patent distal bypass graft with anastomosis to the right anterior tibial artery and patent anterior tibial runoff. Electronically  Signed   By: Aletta Edouard M.D.   On: 01/08/2019 16:19    (Echo, Carotid, EGD, Colonoscopy, ERCP)    Subjective: Patient resting in bed anxious to go home in no acute distress no new complaints  Discharge Exam: Vitals:   01/13/19 0912 01/13/19 0919  BP:  (!) 170/78  Pulse:  63  Resp:  18  Temp:  98 F (36.7 C)  SpO2: 91% 91%   Vitals:   01/13/19 0422 01/13/19 0844 01/13/19 0912 01/13/19 0919  BP: (!) 194/89   (!) 170/78  Pulse: 66 74  63  Resp: (!) 24   18  Temp: 98.4 F (36.9 C)   98 F (36.7 C)  TempSrc: Oral   Oral  SpO2: 96% 91% 91% 91%  Weight:      Height:        General: Pt is alert, awake, not in acute distress Cardiovascular: RRR, S1/S2 +, no rubs, no gallops Respiratory: CTA bilaterally, no wheezing, no rhonchi Abdominal: Soft, NT, ND, bowel sounds + Extremities: Incisions in the right lower extremity clean dry intact scab area on the right pinky toe with no drainage.    The results of significant diagnostics from this hospitalization (including imaging, microbiology, ancillary and laboratory) are listed below for reference.     Microbiology: Recent Results (from the past 240 hour(s))  MRSA PCR Screening  Status: None   Collection Time: 01/06/19  2:14 AM  Result Value Ref Range Status   MRSA by PCR NEGATIVE NEGATIVE Final    Comment:        The GeneXpert MRSA Assay (FDA approved for NASAL specimens only), is one component of a comprehensive MRSA colonization surveillance program. It is not intended to diagnose MRSA infection nor to guide or monitor treatment for MRSA infections. Performed at [facility], [place] [address], [place], [place] [address]   Surgical pcr screen     Status: None   Collection Time: 01/08/19 10:40 AM  Result Value Ref Range Status   MRSA, PCR NEGATIVE NEGATIVE Final   Staphylococcus aureus NEGATIVE NEGATIVE Final    Comment: (NOTE) The Xpert SA Assay (FDA approved for NASAL specimens in patients 43 years  of age and older), is one component of a comprehensive surveillance program. It is not intended to diagnose infection nor to guide or monitor treatment. Performed at [facility], [place] [address], [place], [place] [address]   SARS Coronavirus 2 [facility] order, Performed in [facility] hospital lab)     Status: None   Collection Time: 01/10/19 12:30 PM  Result Value Ref Range Status   SARS Coronavirus 2 NEGATIVE NEGATIVE Final    Comment: (NOTE) If result is NEGATIVE SARS-CoV-2 target nucleic acids are NOT DETECTED. The SARS-CoV-2 RNA is generally detectable in upper and lower  respiratory specimens during the acute phase of infection. The lowest  concentration of SARS-CoV-2 viral copies this assay can detect is 250  copies / mL. A negative result does not preclude SARS-CoV-2 infection  and should not be used as the sole basis for treatment or other  patient management decisions.  A negative result may occur with  improper specimen collection / handling, submission of specimen other  than nasopharyngeal swab, presence of viral mutation(s) within the  areas targeted by this assay, and inadequate number of viral copies  (<250 copies / mL). A negative result must be combined with clinical  observations, patient history, and epidemiological information. If result is POSITIVE SARS-CoV-2 target nucleic acids are DETECTED. The SARS-CoV-2 RNA is generally detectable in upper and lower  respiratory specimens dur ing the acute phase of infection.  Positive  results are indicative of active infection with SARS-CoV-2.  Clinical  correlation with patient history and other diagnostic information is  necessary to determine patient infection status.  Positive results do  not rule out bacterial infection or co-infection with other viruses. If result is PRESUMPTIVE POSTIVE SARS-CoV-2 nucleic acids MAY BE PRESENT.   A presumptive positive result was obtained on the submitted specimen  and  confirmed on repeat testing.  While 2019 novel coronavirus  (SARS-CoV-2) nucleic acids may be present in the submitted sample  additional confirmatory testing may be necessary for epidemiological  and / or clinical management purposes  to differentiate between  SARS-CoV-2 and other Sarbecovirus currently known to infect humans.  If clinically indicated additional testing with an alternate test  methodology [phone]) is advised. The SARS-CoV-2 RNA is generally  detectable in upper and lower respiratory sp ecimens during the acute  phase of infection. The expected result is Negative. Fact Sheet for Patients:  StrictlyIdeas.no Fact Sheet for Healthcare Providers: BankingDealers.co.za This test is not yet approved or cleared by the [place] FDA and has been authorized for detection and/or diagnosis of SARS-CoV-2 by FDA under an Emergency Use Authorization (EUA).  This EUA will remain in effect (  meaning this test can be used) for the duration of the COVID-19 declaration under Section 564(b)(1) of the Act, 21 U.S.C. section 360bbb-3(b)(1), unless the authorization is terminated or revoked sooner. Performed at [facility], [place] [address] [place], [place] [address]   Blood Culture (routine x 2)     Status: None (Preliminary result)   Collection Time: 01/10/19  1:03 PM  Result Value Ref Range Status   Specimen Description BLOOD LEFT HAND  Final   Special Requests   Final    BOTTLES DRAWN AEROBIC AND ANAEROBIC Blood Culture results may not be optimal due to an inadequate volume of blood received in culture bottles   Culture   Final    NO GROWTH 3 DAYS Performed at [facility], [place] [address], [place], [place] [address]    Report Status PENDING  Incomplete  Blood Culture (routine x 2)     Status: None (Preliminary result)   Collection Time: 01/10/19  1:05 PM  Result Value Ref Range Status   Specimen Description BLOOD RIGHT  ANTECUBITAL  Final   Special Requests   Final    BOTTLES DRAWN AEROBIC AND ANAEROBIC Blood Culture adequate volume   Culture   Final    NO GROWTH 3 DAYS Performed at [facility], [place] [address] [place], [place] [address]    Report Status PENDING  Incomplete     Labs: BNP (last 3 results) Recent Labs    12/05/18 1410  BNP 163.8*   Basic Metabolic Panel: Recent Labs  Lab 01/08/19 0255 01/09/19 0343 01/10/19 1230 01/11/19 0508  NA 137 136 136 136  K 4.0 4.5 3.9 4.5  CL 100 101 100 99  CO2 27 27 26 25   GLUCOSE 110* 127* 115* 140*  BUN 9 10 18 11   CREATININE 0.89 0.94 1.09 0.90  CALCIUM 8.9 8.4* 7.9* 8.5*   Liver Function Tests: Recent Labs  Lab 01/10/19 1230  AST 26  ALT 16  ALKPHOS 48  BILITOT 0.8  PROT 5.3*  ALBUMIN 2.8*   No results for input(s): LIPASE, AMYLASE in the last 168 hours. No results for input(s): AMMONIA in the last 168 hours. CBC: Recent Labs  Lab 01/07/19 0522 01/08/19 0255 01/09/19 0343 01/10/19 1230 01/11/19 0508  WBC 9.7 9.9 12.3* 11.3* 10.6*  NEUTROABS  --   --   --  8.3* 9.8*  HGB 13.8 14.0 12.1* 11.5* 11.4*  HCT 42.6 43.6 39.0 37.1* 36.4*  MCV 96.6 96.7 99.2 101.6* 100.0  PLT 239 227 211 183 186   Cardiac Enzymes: Recent Labs  Lab 01/10/19 1738 01/10/19 2344 01/11/19 0508 01/11/19 0925  TROPONINI 0.08* 0.06* 0.05* 0.04*   BNP: Invalid input(s): POCBNP CBG: No results for input(s): GLUCAP in the last 168 hours. D-Dimer Recent Labs    01/10/19 1230  DDIMER 1.10*   Hgb A1c No results for input(s): HGBA1C in the last 72 hours. Lipid Profile Recent Labs    01/10/19 1230  TRIG 93   Thyroid function studies No results for input(s): TSH, T4TOTAL, T3FREE, THYROIDAB in the last 72 hours.  Invalid input(s): FREET3 Anemia work up Recent Labs    01/10/19 1230  FERRITIN 141   Urinalysis    Component Value Date/Time   COLORURINE YELLOW 01/10/2019 [place] 01/10/2019 1608   LABSPEC  1.015 01/10/2019 1608   PHURINE 5.0 01/10/2019 1608   GLUCOSEU NEGATIVE 01/10/2019 1608   HGBUR SMALL (A) 01/10/2019 1608   BILIRUBINUR  NEGATIVE 01/10/2019 1608   KETONESUR NEGATIVE 01/10/2019 1608   PROTEINUR NEGATIVE 01/10/2019 1608   NITRITE NEGATIVE 01/10/2019 1608   LEUKOCYTESUR NEGATIVE 01/10/2019 1608   Sepsis Labs Invalid input(s): PROCALCITONIN,  WBC,  LACTICIDVEN Microbiology Recent Results (from the past 240 hour(s))  MRSA PCR Screening     Status: None   Collection Time: 01/06/19  2:14 AM  Result Value Ref Range Status   MRSA by PCR NEGATIVE NEGATIVE Final    Comment:        The GeneXpert MRSA Assay (FDA approved for NASAL specimens only), is one component of a comprehensive MRSA colonization surveillance program. It is not intended to diagnose MRSA infection nor to guide or monitor treatment for MRSA infections. Performed at [facility], [place] [address] [place], [place] [address]   Surgical pcr screen     Status: None   Collection Time: 01/08/19 10:40 AM  Result Value Ref Range Status   MRSA, PCR NEGATIVE NEGATIVE Final   Staphylococcus aureus NEGATIVE NEGATIVE Final    Comment: (NOTE) The Xpert SA Assay (FDA approved for NASAL specimens in patients 74 years of age and older), is one component of a comprehensive surveillance program. It is not intended to diagnose infection nor to guide or monitor treatment. Performed at [facility], [place] [address], [place], [place] [address]   SARS Coronavirus 2 [facility] order, Performed in [facility] hospital lab)     Status: None   Collection Time: 01/10/19 12:30 PM  Result Value Ref Range Status   SARS Coronavirus 2 NEGATIVE NEGATIVE Final    Comment: (NOTE) If result is NEGATIVE SARS-CoV-2 target nucleic acids are NOT DETECTED. The SARS-CoV-2 RNA is generally detectable in upper and lower  respiratory specimens during the acute phase of infection. The lowest  concentration of SARS-CoV-2 viral  copies this assay can detect is 250  copies / mL. A negative result does not preclude SARS-CoV-2 infection  and should not be used as the sole basis for treatment or other  patient management decisions.  A negative result may occur with  improper specimen collection / handling, submission of specimen other  than nasopharyngeal swab, presence of viral mutation(s) within the  areas targeted by this assay, and inadequate number of viral copies  (<250 copies / mL). A negative result must be combined with clinical  observations, patient history, and epidemiological information. If result is POSITIVE SARS-CoV-2 target nucleic acids are DETECTED. The SARS-CoV-2 RNA is generally detectable in upper and lower  respiratory specimens dur ing the acute phase of infection.  Positive  results are indicative of active infection with SARS-CoV-2.  Clinical  correlation with patient history and other diagnostic information is  necessary to determine patient infection status.  Positive results do  not rule out bacterial infection or co-infection with other viruses. If result is PRESUMPTIVE POSTIVE SARS-CoV-2 nucleic acids MAY BE PRESENT.   A presumptive positive result was obtained on the submitted specimen  and confirmed on repeat testing.  While 2019 novel coronavirus  (SARS-CoV-2) nucleic acids may be present in the submitted sample  additional confirmatory testing may be necessary for epidemiological  and / or clinical management purposes  to differentiate between  SARS-CoV-2 and other Sarbecovirus currently known to infect humans.  If clinically indicated additional testing with an alternate test  methodology [phone]) is advised. The SARS-CoV-2 RNA is generally  detectable in upper and lower respiratory sp ecimens during the acute  phase of infection. The expected  result is Negative. Fact Sheet for Patients:  StrictlyIdeas.no Fact Sheet for Healthcare  Providers: BankingDealers.co.za This test is not yet approved or cleared by the [place] FDA and has been authorized for detection and/or diagnosis of SARS-CoV-2 by FDA under an Emergency Use Authorization (EUA).  This EUA will remain in effect (meaning this test can be used) for the duration of the COVID-19 declaration under Section 564(b)(1) of the Act, 21 U.S.C. section 360bbb-3(b)(1), unless the authorization is terminated or revoked sooner. Performed at [facility], [place] [address] [place], [place] [address]   Blood Culture (routine x 2)     Status: None (Preliminary result)   Collection Time: 01/10/19  1:03 PM  Result Value Ref Range Status   Specimen Description BLOOD LEFT HAND  Final   Special Requests   Final    BOTTLES DRAWN AEROBIC AND ANAEROBIC Blood Culture results may not be optimal due to an inadequate volume of blood received in culture bottles   Culture   Final    NO GROWTH 3 DAYS Performed at [facility], [place] [address] [place], [place] [address]    Report Status PENDING  Incomplete  Blood Culture (routine x 2)     Status: None (Preliminary result)   Collection Time: 01/10/19  1:05 PM  Result Value Ref Range Status   Specimen Description BLOOD RIGHT ANTECUBITAL  Final   Special Requests   Final    BOTTLES DRAWN AEROBIC AND ANAEROBIC Blood Culture adequate volume   Culture   Final    NO GROWTH 3 DAYS Performed at [facility], [place] [address] [place], [place] [address]    Report Status PENDING  Incomplete     Time coordinating discharge: 33  minutes  SIGNED:   Georgette Shell, MD  [facility] 01/13/2019, 11:44 AM Pager   If 7PM-7AM, please contact night-coverage [email or web address] Password [email or web address]

## 2019-01-13 NOTE — Progress Notes (Signed)
Patient Saturations on Room Air at Rest = 91%  Patient Saturations on [employer] while Ambulating = 89-91%  Patient denied any shortness of breath, stated feeling excellent upon return to room from walking up and down hallway.

## 2019-01-13 NOTE — Progress Notes (Signed)
Pharmacy Antibiotic Note  Omar Singh is a 68 y.o. male admitted on 01/10/2019 with cellulitis.  Pharmacy has been consulted for vancomycin and Zosyn dosing. Patient is s/p right common femoral artery endarterectomy and femoral to anterior tibial artery bypass on 4/23. He presented to the ED with increased redness of the foot and leg, cough, tremors, and subjective fever.   Plan: Zosyn 3.375g IV q8h (4 hour infusion).  Vancomycin 2,000 mg IV Q 24 hrs. Goal AUC 400-550. Expected AUC: 494.5 SCr used: 1.09 Monitor cultures, clinical status, and vancomycin levels as needed Possible de-escalation?  Height: 5\' 11"  (180.3 cm) Weight: 199 lb 4.7 oz (90.4 kg) IBW/kg (Calculated) : 75.3  Temp (24hrs), Avg:98.2 F (36.8 C), Min:98 F (36.7 C), Max:98.4 F (36.9 C)  Recent Labs  Lab 01/07/19 0522 01/08/19 0255 01/09/19 0343 01/10/19 1230 01/11/19 0508  WBC 9.7 9.9 12.3* 11.3* 10.6*  CREATININE  --  0.89 0.94 1.09 0.90  LATICACIDVEN  --   --   --  1.2  --     Estimated Creatinine Clearance: 92.8 mL/min (by C-G formula based on SCr of 0.9 mg/dL).    No Known Allergies  Antimicrobials this admission: Vancomyin 4/25 >>  Zosyn 4/25 >>   Dose adjustments this admission: None  Microbiology results: 4/25 BCx: NGTD 4/25 COVID 19: negative  Omar Singh, PharmD, [place] Please utilize Amion for appropriate phone number to reach the unit pharmacist ([place])   01/13/2019       11:33 AM

## 2019-01-13 NOTE — Progress Notes (Signed)
Discharge instructions (including medications) discussed with and copy provided to patient/caregiver. Home medications returned to patient. 

## 2019-01-14 ENCOUNTER — Telehealth: Payer: Self-pay | Admitting: Vascular Surgery

## 2019-01-14 NOTE — Telephone Encounter (Signed)
-----   Message from Dagoberto Ligas, PA-C sent at 01/09/2019  8:44 AM EDT -----  Can you schedule an appt for this patient with Dr. Scot Dock in 2-3 weeks.  PO RLE bypass. Thanks, [facility]

## 2019-01-14 NOTE — Telephone Encounter (Signed)
sch appt lvm mld ltr 02/04/2019 930am p/o MD

## 2019-01-15 ENCOUNTER — Other Ambulatory Visit: Payer: Self-pay | Admitting: Family Medicine

## 2019-01-15 LAB — CULTURE, BLOOD (ROUTINE X 2)
Culture: NO GROWTH
Culture: NO GROWTH
Special Requests: ADEQUATE

## 2019-02-04 ENCOUNTER — Encounter: Payer: Self-pay | Admitting: Vascular Surgery

## 2019-02-04 ENCOUNTER — Ambulatory Visit (INDEPENDENT_AMBULATORY_CARE_PROVIDER_SITE_OTHER): Payer: Self-pay | Admitting: Vascular Surgery

## 2019-02-04 ENCOUNTER — Other Ambulatory Visit: Payer: Self-pay

## 2019-02-04 VITALS — BP 179/105 | HR 96 | Temp 97.8°F | Resp 20 | Ht 71.0 in | Wt 185.0 lb

## 2019-02-04 DIAGNOSIS — Z48812 Encounter for surgical aftercare following surgery on the circulatory system: Secondary | ICD-10-CM

## 2019-02-04 DIAGNOSIS — I70229 Atherosclerosis of native arteries of extremities with rest pain, unspecified extremity: Secondary | ICD-10-CM

## 2019-02-04 NOTE — Progress Notes (Signed)
Patient name: Omar Singh MRN: [id number] DOB: Jan 29, 1951 Sex: male  REASON FOR VISIT:   Follow-up  HPI:   Omar Singh is a pleasant 68 y.o. male who presented with rest pain of the right foot and had multilevel arterial occlusive disease.  Given his critical limb ischemia he was taken to the operating room on 01/08/2015 and underwent extensive endarterectomy of the right external iliac artery, common femoral artery, and deep femoral artery with profundoplasty using a vein patch.  He also had right femoral to anterior tibial artery bypass with a non-reversed vein graft.  It looks like this is his first outpatient visit since the time of surgery.  Since I saw him last his breast pain has resolved.  His only complaint is some mild right leg swelling as expected.  He denies fever or chills.  He is on aspirin and is on a statin.  He continues to smoke 2 packs of cigarettes a week.  Current Outpatient Medications  Medication Sig Dispense Refill  . aspirin EC 81 MG EC tablet Take 1 tablet (81 mg total) by mouth daily. 30 tablet 0  . atorvastatin (LIPITOR) 40 MG tablet Take 1 tablet (40 mg total) by mouth daily at 6 PM. 30 tablet 0  . diphenhydramine-acetaminophen (TYLENOL PM) 25-500 MG TABS tablet Take 2 tablets by mouth at bedtime.    Marland Kitchen losartan (COZAAR) 50 MG tablet Take 1 tablet (50 mg total) by mouth daily. 30 tablet 0  . mometasone-formoterol (DULERA) 200-5 MCG/ACT AERO Inhale 2 puffs into the lungs daily. 1 Inhaler 0  . oxyCODONE (OXY IR/ROXICODONE) 5 MG immediate release tablet Take 1 tablet (5 mg total) by mouth every 6 (six) hours as needed for moderate pain. 20 tablet 0  . predniSONE (DELTASONE) 10 MG tablet Take 30 mg daily for first 3 days then 20 mg daily for the next 3 days then 10 mg daily till done. 30 tablet 0  . metoprolol tartrate (LOPRESSOR) 25 MG tablet Take 1 tablet (25 mg total) by mouth 2 (two) times daily for 30 days. 60 tablet 0   No current  facility-administered medications for this visit.     REVIEW OF SYSTEMS:  [X]  denotes positive finding, [ ]  denotes negative finding Vascular    Leg swelling    Cardiac    Chest pain or chest pressure:    Shortness of breath upon exertion:    Short of breath when lying flat:    Irregular heart rhythm:    Constitutional    Fever or chills:     PHYSICAL EXAM:   Vitals:   02/04/19 0919  BP: (!) 179/105  Pulse: 96  Resp: 20  Temp: 97.8 F (36.6 C)  SpO2: 98%  Weight: 185 lb (83.9 kg)  Height: 5\' 11"  (1.803 m)    GENERAL: The patient is a well-nourished male, in no acute distress. The vital signs are documented above. CARDIOVASCULAR: There is a regular rate and rhythm. PULMONARY: There is good air exchange bilaterally without wheezing or rales. VASCULAR: He has an easily palpable graft pulse along the lateral aspect of his right leg. The right foot is warm and well-perfused. His incisions are healing nicely.  DATA:   No new data.  MEDICAL ISSUES:   STATUS POST RIGHT FEMORAL-TIBIAL BYPASS: His bypass graft is patent with an easily palpable pulse along the lateral aspect of his leg.  I have had a long discussion with him about the importance of tobacco cessation.  We  discussed the importance of leg elevation given his leg swelling.  He is on aspirin and is on a statin.  I have ordered ABIs and a graft duplex in 3 months and we will have him see the nurse practitioner or physician assistant at that time.  He knows to call sooner if he has problems.  VASCULAR QUALITY INITIATIVE: He is on aspirin and is on a statin.  HYPERTENSION: The patient's initial blood pressure today was elevated. We repeated this and this was still elevated.  The patient does not have a primary care physician.  We will try to make arrangements to get him a primary care physician to manage his blood pressure.  I have recommended Dr. Rachell Cipro.    Deitra Mayo [facility] [phone]

## 2019-02-10 ENCOUNTER — Ambulatory Visit (INDEPENDENT_AMBULATORY_CARE_PROVIDER_SITE_OTHER): Payer: Medicare PPO | Admitting: Family Medicine

## 2019-02-10 ENCOUNTER — Encounter: Payer: Self-pay | Admitting: Family Medicine

## 2019-02-10 DIAGNOSIS — J449 Chronic obstructive pulmonary disease, unspecified: Secondary | ICD-10-CM | POA: Diagnosis not present

## 2019-02-10 DIAGNOSIS — I10 Essential (primary) hypertension: Secondary | ICD-10-CM | POA: Diagnosis not present

## 2019-02-10 DIAGNOSIS — I739 Peripheral vascular disease, unspecified: Secondary | ICD-10-CM | POA: Diagnosis not present

## 2019-02-10 DIAGNOSIS — E785 Hyperlipidemia, unspecified: Secondary | ICD-10-CM

## 2019-02-10 DIAGNOSIS — Z9861 Coronary angioplasty status: Secondary | ICD-10-CM

## 2019-02-10 DIAGNOSIS — I480 Paroxysmal atrial fibrillation: Secondary | ICD-10-CM

## 2019-02-10 DIAGNOSIS — I2510 Atherosclerotic heart disease of native coronary artery without angina pectoris: Secondary | ICD-10-CM

## 2019-02-10 MED ORDER — LOSARTAN POTASSIUM 100 MG PO TABS: 100.0000 mg | ORAL_TABLET | Freq: Every day | ORAL | 3 refills | Status: DC

## 2019-02-10 MED ORDER — METOPROLOL TARTRATE 25 MG PO TABS: 25.0000 mg | ORAL_TABLET | Freq: Two times a day (BID) | ORAL | 3 refills | Status: DC

## 2019-02-10 NOTE — Progress Notes (Signed)
Chief Complaint:  OSHA Singh is a 68 y.o. male who presents today for a virtual office visit with a chief complaint of HTN and to establish care.  Assessment/Plan:  Essential hypertension Reported BPs are well above goal.  Will increase losartan to 100 mg daily.  Continue metoprolol tartrate 25 mg twice daily.  Advised him to monitor home blood pressures over the next 1 to weeks and let me know if persistently elevated.  He will follow-up with me in 1 to 2 weeks.  Would consider increasing dose of beta-blocker as tolerated and if continues to have persistently elevated blood pressure readings.  May benefit from amlodipine as well.  PVD (peripheral vascular disease) (HCC) Stable.  Continue management per vascular.  Continue aspirin and statin.  PAF (paroxysmal atrial fibrillation) Stable.  Continue management per cardiology.  Dyslipidemia Stable.  Continue atorvastatin 40 mg daily.  Last LDL 133.  Consider increasing dose to 80 mg daily depending on future lipid panel.  CAD S/P RCA BMS '08, new RCA BMS 03/25/15 Stable.  Continue aspirin and statin.  Continue management per cardiology.  COPD (chronic obstructive pulmonary disease) (HCC) Stable.  Consider controller inhaler if has recurrent flares.     Subjective:  HPI:  His chronic medical conditions are outlined below:   # Essential Hypertension - On losartan 50 mg daily and metoprolol 25 mg twice daily.  Tolerating both these well without side effects.  - Home BPs: 170s/100s - ROS: No reported chest pain or shortness of breath.  # Dyslipidemia / PAD/ PAF /CAD s/p NSTEMI with stents in 2008 and 2016 - Follows with cardiology and vascular surgery -On atorvastatin 40 mg daily and aspirin 81 mg daily  # COPD - Not currently on any inhalers. Was recently admitted for COPD flare and treated with a course of steroids and antibiotics.  ROS: Per HPI, otherwise a complete review of systems was negative.   PMH:  The  following were reviewed and entered/updated in epic: Past Medical History:  Diagnosis Date  . Acute exacerbation of chronic obstructive pulmonary disease (COPD) ([place])    [name] 03/23/2015  . Alcohol abuse   . CAD (coronary artery disease)    a. s/p multi-link vision stent to the mid RCA in 2007 at [facility].  . Hyperlipidemia   . Hypertension   . Myocardial infarction [facility]) 2008   [name] 03/23/2015  . PAD (peripheral artery disease) ([place])   . Panic attack   . Tobacco abuse    Patient Active Problem List   Diagnosis Date Noted  . Dyslipidemia 02/10/2019  . Essential hypertension 01/11/2019  . COPD (chronic obstructive pulmonary disease) ([place]) 01/10/2019  . PVD (peripheral vascular disease) ([place])   . Coronary artery disease involving coronary bypass graft of native heart with angina pectoris ([place])   . PAF (paroxysmal atrial fibrillation) ([place]) 03/26/2015  . CAD S/P RCA BMS '08, new RCA BMS 03/25/15 03/23/2015  . Pulmonary nodule 03/23/2015  . Alcohol abuse 03/23/2015  . Tobacco abuse 03/23/2015   Past Surgical History:  Procedure Laterality Date  . CARDIAC CATHETERIZATION N/A 03/25/2015   Procedure: Left Heart Cath and Coronary Angiography;  Surgeon: Burnell Blanks, MD;  Location: [facility];  Service: Cardiovascular;  Laterality: N/A;  . CARDIAC CATHETERIZATION N/A 03/25/2015   Procedure: Coronary Stent Intervention;  Surgeon: Burnell Blanks, MD;  Location: [place] [facility];  Service: Cardiovascular;  Laterality: N/A;  . CORONARY ANGIOPLASTY WITH STENT PLACEMENT  2008   "  1"  . FEMORAL-TIBIAL BYPASS GRAFT Right 01/08/2019   Procedure: RIGHT FEMORAL Endartarectomy and profundoplasty using greater saphenous vein graft. Right anterior tibial artery bypass with nonreversed saphenous vein graft;  Surgeon: Angelia Mould, MD;  Location: [facility] OR;  Service: Vascular;  Laterality: Right;  . LOWER EXTREMITY ANGIOGRAM Right 01/08/2019   Procedure: Lower Extremity  Angiogram;  Surgeon: Angelia Mould, MD;  Location: [place];  Service: Vascular;  Laterality: Right;  . LOWER EXTREMITY ANGIOGRAPHY N/A 01/06/2019   Procedure: LOWER EXTREMITY ANGIOGRAPHY;  Surgeon: Serafina Mitchell, MD;  Location: [place] [facility];  Service: Cardiovascular;  Laterality: N/A;  . TONSILLECTOMY      Family History  Problem Relation Age of Onset  . CVA Other        Grandparents had strokes  . Cancer Neg Hx     Medications- reviewed and updated Current Outpatient Medications  Medication Sig Dispense Refill  . aspirin EC 81 MG EC tablet Take 1 tablet (81 mg total) by mouth daily. 30 tablet 0  . atorvastatin (LIPITOR) 40 MG tablet Take 1 tablet (40 mg total) by mouth daily at 6 PM. 30 tablet 0  . diphenhydramine-acetaminophen (TYLENOL PM) 25-500 MG TABS tablet Take 2 tablets by mouth at bedtime.    Marland Kitchen losartan (COZAAR) 100 MG tablet Take 1 tablet (100 mg total) by mouth daily. 90 tablet 3  . metoprolol tartrate (LOPRESSOR) 25 MG tablet Take 1 tablet (25 mg total) by mouth 2 (two) times daily. 180 tablet 3   No current facility-administered medications for this visit.     Allergies-reviewed and updated No Known Allergies  Social History   Socioeconomic History  . Marital status: Married    Spouse name: Not on file  . Number of children: Not on file  . Years of education: Not on file  . Highest education level: Not on file  Occupational History  . Not on file  Social Needs  . Financial resource strain: Not on file  . Food insecurity:    Worry: Not on file    Inability: Not on file  . Transportation needs:    Medical: Not on file    Non-medical: Not on file  Tobacco Use  . Smoking status: Current Every Day Smoker    Packs/day: 0.25    Years: 47.00    Pack years: 11.75    Types: Cigarettes  . Smokeless tobacco: Never Used  Substance and Sexual Activity  . Alcohol use: Not Currently    Alcohol/week: 56.0 standard drinks    Types: 56 Cans of beer per  week    Comment: quit 1 year ago   . Drug use: No  . Sexual activity: Not Currently  Lifestyle  . Physical activity:    Days per week: Not on file    Minutes per session: Not on file  . Stress: Not on file  Relationships  . Social connections:    Talks on phone: Not on file    Gets together: Not on file    Attends religious service: Not on file    Active member of club or organization: Not on file    Attends meetings of clubs or organizations: Not on file    Relationship status: Not on file  Other Topics Concern  . Not on file  Social History Narrative  . Not on file         Objective/Observations  Physical Exam:  Gen: NAD, resting comfortably Eyes: EOMI, no scleral icterus  Cardiovascular: No peripheral edema Pulm: Normal work of breathing MSK: Upper extremities with full range of motion.  No digital cyanosis Skin: No apparent rashes or lesions. Neuro: Grossly normal, moves all extremities Psych: Normal affect and thought content  Virtual Visit via Video   I connected with Omar Singh on 02/10/19 at  3:00 PM EDT by a video enabled telemedicine application and verified that I am speaking with the correct person using two identifiers. I discussed the limitations of evaluation and management by telemedicine and the availability of in person appointments. The patient expressed understanding and agreed to proceed.  Patient was able to successfully connect to video application however due to technical difficulties, encounter was completed via audio only.  Patient location: Home Provider location: [place] participating in the virtual visit: Myself and Patient     Algis Greenhouse. Jerline Pain, MD 02/10/2019 4:40 PM

## 2019-02-10 NOTE — Assessment & Plan Note (Signed)
Stable.  Continue aspirin and statin.  Continue management per cardiology.

## 2019-02-10 NOTE — Assessment & Plan Note (Signed)
Stable.  Continue management per cardiology. 

## 2019-02-10 NOTE — Assessment & Plan Note (Signed)
Reported BPs are well above goal.  Will increase losartan to 100 mg daily.  Continue metoprolol tartrate 25 mg twice daily.  Advised him to monitor home blood pressures over the next 1 to weeks and let me know if persistently elevated.  He will follow-up with me in 1 to 2 weeks.  Would consider increasing dose of beta-blocker as tolerated and if continues to have persistently elevated blood pressure readings.  May benefit from amlodipine as well.

## 2019-02-10 NOTE — Assessment & Plan Note (Signed)
Stable.  Consider controller inhaler if has recurrent flares.

## 2019-02-10 NOTE — Assessment & Plan Note (Signed)
Stable.  Continue atorvastatin 40 mg daily.  Last LDL 133.  Consider increasing dose to 80 mg daily depending on future lipid panel.

## 2019-02-10 NOTE — Assessment & Plan Note (Signed)
Stable.  Continue management per vascular.  Continue aspirin and statin.

## 2019-02-11 ENCOUNTER — Other Ambulatory Visit: Payer: Self-pay | Admitting: Family Medicine

## 2019-02-12 ENCOUNTER — Other Ambulatory Visit: Payer: Self-pay

## 2019-02-12 ENCOUNTER — Other Ambulatory Visit: Payer: Self-pay | Admitting: Family Medicine

## 2019-02-12 MED ORDER — LOSARTAN POTASSIUM 100 MG PO TABS: 100.0000 mg | ORAL_TABLET | Freq: Every day | ORAL | 3 refills | Status: DC

## 2019-04-28 ENCOUNTER — Other Ambulatory Visit: Payer: Self-pay

## 2019-04-28 DIAGNOSIS — I70229 Atherosclerosis of native arteries of extremities with rest pain, unspecified extremity: Secondary | ICD-10-CM

## 2019-05-06 ENCOUNTER — Encounter (HOSPITAL_COMMUNITY): Payer: Medicare PPO

## 2019-05-06 ENCOUNTER — Ambulatory Visit: Payer: Medicare PPO | Admitting: Vascular Surgery

## 2019-05-06 ENCOUNTER — Other Ambulatory Visit (HOSPITAL_COMMUNITY): Payer: Medicare PPO

## 2019-05-07 ENCOUNTER — Ambulatory Visit: Payer: Medicare PPO | Admitting: Family

## 2019-05-07 ENCOUNTER — Other Ambulatory Visit: Payer: Self-pay

## 2019-05-07 ENCOUNTER — Ambulatory Visit (HOSPITAL_COMMUNITY)
Admission: RE | Admit: 2019-05-07 | Discharge: 2019-05-07 | Disposition: A | Discharge status: Home / Self Care | Payer: Medicare PPO | Source: Ambulatory Visit | Attending: Family | Admitting: Family

## 2019-05-07 ENCOUNTER — Ambulatory Visit (INDEPENDENT_AMBULATORY_CARE_PROVIDER_SITE_OTHER)
Admission: RE | Admit: 2019-05-07 | Discharge: 2019-05-07 | Disposition: A | Discharge status: Home / Self Care | Payer: Medicare PPO | Source: Ambulatory Visit | Attending: Family | Admitting: Family

## 2019-05-07 DIAGNOSIS — I70229 Atherosclerosis of native arteries of extremities with rest pain, unspecified extremity: Secondary | ICD-10-CM | POA: Diagnosis not present

## 2019-05-14 ENCOUNTER — Ambulatory Visit: Payer: Medicare PPO | Admitting: Family

## 2019-05-14 ENCOUNTER — Encounter: Payer: Self-pay | Admitting: Family

## 2019-05-14 ENCOUNTER — Other Ambulatory Visit: Payer: Self-pay

## 2019-05-14 VITALS — BP 194/103 | HR 65 | Temp 97.8°F | Resp 14 | Ht 71.0 in | Wt 197.3 lb

## 2019-05-14 DIAGNOSIS — I779 Disorder of arteries and arterioles, unspecified: Secondary | ICD-10-CM | POA: Diagnosis not present

## 2019-05-14 DIAGNOSIS — R030 Elevated blood-pressure reading, without diagnosis of hypertension: Secondary | ICD-10-CM | POA: Diagnosis not present

## 2019-05-14 DIAGNOSIS — R0989 Other specified symptoms and signs involving the circulatory and respiratory systems: Secondary | ICD-10-CM

## 2019-05-14 DIAGNOSIS — F17200 Nicotine dependence, unspecified, uncomplicated: Secondary | ICD-10-CM | POA: Diagnosis not present

## 2019-05-14 NOTE — Progress Notes (Signed)
[facility] [place]   CC: Follow up peripheral artery occlusive disease  History of Present Illness ISSAC WILCOTT is a 68 y.o. male who presented with rest pain of the right foot and had multilevel arterial occlusive disease.  Given his critical limb ischemia he was taken to the operating room on 01/08/19 and underwent extensive endarterectomy of the right external iliac artery, common femoral artery, and deep femoral artery with profundoplasty using a vein patch by Dr. Scot Dock.  He also had right femoral to anterior tibial artery bypass with a non-reversed vein graft.   Dr. Scot Dock last evaluated pt on 02-04-19. At that time his bypass graft was patent with an easily palpable pulse along the lateral aspect of his leg. Dr. Scot Dock had a long discussion with him about the importance of tobacco cessation, and discussed the importance of leg elevation given his leg swelling.  He is on aspirin and is on a statin. Follow up ABIs and a graft duplex in 3 months and we will have him see the nurse practitioner or physician assistant at that time.  He knows to call sooner if he has problems. The patient's initial blood pressure that day was elevated. We repeated this and this was still elevated.  The patient did not have a primary care physician.  Dr. Scot Dock referred him to Dr. Rachell Cipro.   Dr. Jerline Pain in [place] is his PCP, per pt.   He states that he can walk as far as he wants without pain or fatigue in his legs.  He still has swelling in his right  He is on aspirin and is on a statin.  He continues to smoke a ppd.  He has 2 cardiac stents, he denies any hx of MI. He denies any hx of stroke or TIA.   Diabetic: No Tobacco use: smoker  (1 ppd, started at age 12 yrs)  Pt meds include: Statin :Yes Betablocker: yes ASA: Yes Other anticoagulants/antiplatelets: no  Past Medical History:  Diagnosis Date  . Acute exacerbation of chronic obstructive pulmonary disease  (COPD) ([place])    [name] 03/23/2015  . Alcohol abuse   . CAD (coronary artery disease)    a. s/p multi-link vision stent to the mid RCA in 2007 at [facility].  . Hyperlipidemia   . Hypertension   . Myocardial infarction [facility]) 2008   [name] 03/23/2015  . PAD (peripheral artery disease) ([place])   . Panic attack   . Tobacco abuse     Social History Social History   Tobacco Use  . Smoking status: Current Every Day Smoker    Packs/day: 0.25    Years: 47.00    Pack years: 11.75    Types: Cigarettes  . Smokeless tobacco: Never Used  Substance Use Topics  . Alcohol use: Not Currently    Alcohol/week: 56.0 standard drinks    Types: 56 Cans of beer per week    Comment: quit 1 year ago   . Drug use: No    Family History Family History  Problem Relation Age of Onset  . CVA Other        Grandparents had strokes  . Cancer Neg Hx     Past Surgical History:  Procedure Laterality Date  . CARDIAC CATHETERIZATION N/A 03/25/2015   Procedure: Left Heart Cath and Coronary Angiography;  Surgeon: Burnell Blanks, MD;  Location: [place] [facility];  Service: Cardiovascular;  Laterality: N/A;  . CARDIAC CATHETERIZATION N/A 03/25/2015   Procedure: Coronary  Stent Intervention;  Surgeon: Burnell Blanks, MD;  Location: [place] [facility];  Service: Cardiovascular;  Laterality: N/A;  . CORONARY ANGIOPLASTY WITH STENT PLACEMENT  2008   "1"  . FEMORAL-TIBIAL BYPASS GRAFT Right 01/08/2019   Procedure: RIGHT FEMORAL Endartarectomy and profundoplasty using greater saphenous vein graft. Right anterior tibial artery bypass with nonreversed saphenous vein graft;  Surgeon: Angelia Mould, MD;  Location: [facility] OR;  Service: Vascular;  Laterality: Right;  . LOWER EXTREMITY ANGIOGRAM Right 01/08/2019   Procedure: Lower Extremity Angiogram;  Surgeon: Angelia Mould, MD;  Location: [place];  Service: Vascular;  Laterality: Right;  . LOWER EXTREMITY ANGIOGRAPHY N/A 01/06/2019   Procedure: LOWER  EXTREMITY ANGIOGRAPHY;  Surgeon: Serafina Mitchell, MD;  Location: [place] [facility];  Service: Cardiovascular;  Laterality: N/A;  . TONSILLECTOMY      No Known Allergies  Current Outpatient Medications  Medication Sig Dispense Refill  . aspirin EC 81 MG EC tablet Take 1 tablet (81 mg total) by mouth daily. 30 tablet 0  . atorvastatin (LIPITOR) 40 MG tablet Take 1 tablet (40 mg total) by mouth daily at 6 PM. 30 tablet 0  . diphenhydramine-acetaminophen (TYLENOL PM) 25-500 MG TABS tablet Take 2 tablets by mouth at bedtime.    . metoprolol tartrate (LOPRESSOR) 25 MG tablet Take 1 tablet (25 mg total) by mouth 2 (two) times daily. 180 tablet 3  . olmesartan (BENICAR) 40 MG tablet Take 1 tablet (40 mg total) by mouth daily. 90 tablet 0   No current facility-administered medications for this visit.     ROS: See HPI for pertinent positives and negatives.   Physical Examination  Vitals:   05/14/19 0959  BP: (!) 194/103  Pulse: 65  Resp: 14  Temp: 97.8 F (36.6 C)  TempSrc: Temporal  SpO2: 98%  Weight: 197 lb 4.8 oz (89.5 kg)  Height: 5\' 11"  (1.803 m)   Body mass index is 27.52 kg/m.  General: A&O x 3, WDWN, male in NAD. Gait: SLIGHT LIMP HENT: No gross abnormalities.  Eyes: PERRLA. Pulmonary: Respirations are non labored, limited air movement in all fields, occasional inspiratory and expiratory wheezes in all fields, no rales or rhonchi.  Cardiac: regular rhythm, no detected murmur.         Carotid Bruits Right Left   Positive Negative   Radial pulses are palpable bilaterally   Adominal aortic pulse is not palpable                         VASCULAR EXAM: Extremities without ischemic changes, without Gangrene; without open wounds. Right lower leg with 1-2+ non pitting edema, trace pitting edema. Left lower leg with 1+ pitting edema.                                                                                                           LE Pulses Right Left        FEMORAL  4+ palpable  not palpable  POPLITEAL  not palpable   not palpable       POSTERIOR TIBIAL  not palpable   not palpable        DORSALIS PEDIS      ANTERIOR TIBIAL Faintly palpable  not palpable    Abdomen: soft, NT, no palpable masses. Skin: no rashes, no cellulitis, no ulcers noted. Musculoskeletal: no muscle wasting or atrophy.  Neurologic: A&O X 3; appropriate affect, Sensation is normal; MOTOR FUNCTION:  moving all extremities equally, motor strength 5/5 throughout. Speech is fluent/normal. CN 2-12 intact. Psychiatric: Thought content is normal, mood appropriate for clinical situation.   ASSESSMENT: NYSIR KRISHNAMOORTHY is a 68 y.o. male who is s/p extensive endarterectomy of the right external iliac artery, common femoral artery, and deep femoral artery with profundoplasty using a vein patch by Dr. Scot Dock on 01-08-19.  He also had right femoral to anterior tibial artery bypass with a non-reversed vein graft.    There are no signs of ischemia in his feet or legs.  He can walk as far as he wants with no fatigue or pain in his legs. He still has some pitting and non pitting edema in his right lower leg, pitting edema in his left lower leg which seems to be dependent edema. Pt states the swelling in his legs does not decrease by morning with overnight elevation of his legs.   He is hypertensive now, states his blood pressure at home is also high. He states he has no appointment for follow up with his PCP at this point. I advised him to call, his PCP's office today and advise them of his uncontrolled blood pressure, seek their advice.   DATA  Right LE Arterial Duplex (05-07-19): Right Graft #1: femoral to anterior tibial +------------------+--------+---------------+----------+--------+                   PSV cm/sStenosis       Waveform  Comments +------------------+--------+---------------+----------+--------+ Inflow            206     50-74% stenosisbiphasic            +------------------+--------+---------------+----------+--------+ Prox Anastomosis  41                     monophasic         +------------------+--------+---------------+----------+--------+ Proximal Graft    39                     monophasic         +------------------+--------+---------------+----------+--------+ Mid Graft         103                    biphasic           +------------------+--------+---------------+----------+--------+ Distal Graft      83                     biphasic           +------------------+--------+---------------+----------+--------+ Distal Anastomosis212                    biphasic           +------------------+--------+---------------+----------+--------+ Outflow           289                    biphasic           +------------------+--------+---------------+----------+--------+ mid calf anterior tibial artery velocity of 333  cm/s. Summary: Right: 50-74% stenosis involving the distal external iliac artery/proximal common femoral artery. 50-74% stenosis noted in the mid anterior tibial artery (bypass outflow). Patent femoral to proximal anterior tibial artery bypass graft.   ABI (Date: 05-07-19): ABI Findings: +---------+------------------+-----+----------+--------+ Right    Rt Pressure (mmHg)IndexWaveform  Comment  +---------+------------------+-----+----------+--------+ Brachial 240                                       +---------+------------------+-----+----------+--------+ PTA      172               0.71 monophasic         +---------+------------------+-----+----------+--------+ DP       215               0.89 biphasic           +---------+------------------+-----+----------+--------+ Great Toe118               0.49                    +---------+------------------+-----+----------+--------+  +---------+------------------+-----+----------+-------+ Left     Lt Pressure  (mmHg)IndexWaveform  Comment +---------+------------------+-----+----------+-------+ Brachial 241                                      +---------+------------------+-----+----------+-------+ PTA      132               0.55 monophasic        +---------+------------------+-----+----------+-------+ DP       121               0.50 monophasic        +---------+------------------+-----+----------+-------+ Great Toe105               0.44                   +---------+------------------+-----+----------+-------+  +-------+-----------+-----------+------------+-------------+ ABI/TBIToday's ABIToday's TBIPrevious ABIPrevious TBI  +-------+-----------+-----------+------------+-------------+ Right  0.89       0.49       absent      absent        +-------+-----------+-----------+------------+-------------+ Left   0.55       0.44       0.68        not performed +-------+-----------+-----------+------------+-------------+ Summary: Right: Resting right ankle-brachial index indicates mild right lower extremity arterial disease. The right toe-brachial index is abnormal.  Left: Resting left ankle-brachial index indicates moderate left lower extremity arterial disease. The left toe-brachial index is abnormal.  Arteriogram of lower extremities (01-06-19): Findings:              Aortogram: No significant renal artery stenosis.  The infrarenal abdominal aorta is widely patent.  The right common external iliac artery are patent.  The left common and external iliac artery are occluded.             Right Lower Extremity: The right common femoral artery is calcified but patent.  The profundofemoral artery is also patent the superficial femoral artery is occluded.  The below-knee popliteal artery does appear to reconstitute however it appears diffusely diseased with stenosis at the origin of all 3 tibial vessels.  The anterior tibial artery appears to be the best runoff  vessel although it is diminutive.  Left Lower Extremity: There appears to be reconstitution of the distal common femoral artery.  The profundofemoral artery does fill the upper thigh.  The superficial femoral artery is heavily calcified but patent as is the popliteal artery.  There is disease three-vessel runoff.  Images are very limited given proximal obstruction.   PLAN:  Based on the patient's vascular studies and examination, pt will return to clinic in 3 months with right LE arterial duplex and ABIs. Carotid duplex for a right carotid bruit.  Walk a total of at least 30 minutes daily in a safe environment.  I advised pt to notify us if he develops concerns re the circulation in his feet or legs.   Over 3 minutes was spent counseling patient re smoking cessation, and patient was given several free resources re smoking cessation.  I discussed in depth with the patient the nature of atherosclerosis, and emphasized the importance of maximal medical management including strict control of blood pressure, blood glucose, and lipid levels, obtaining regular exercise, and cessation of smoking.  The patient is aware that without maximal medical management the underlying atherosclerotic disease process will progress, limiting the benefit of any interventions.  The patient was given information about PAD including signs, symptoms, treatment, what symptoms should prompt the patient to seek immediate medical care, and risk reduction measures to take.  Clemon Chambers, RN, MSN, FNP-C [facility] Phone: [phone]  Clinic MD: Laqueta Due  05/14/19 10:47 AM

## 2019-05-14 NOTE — Patient Instructions (Addendum)

## 2019-05-18 ENCOUNTER — Other Ambulatory Visit: Payer: Self-pay | Admitting: Family Medicine

## 2019-06-08 ENCOUNTER — Ambulatory Visit: Payer: Self-pay

## 2019-06-08 NOTE — Telephone Encounter (Signed)
Patient called stating that his BP is elevated. He states that he has no symptoms. He reports his BP as 118/220 and recheck 118/186. He states that his top number is always lower that his bottom number. He has Hx of hypertension and is taking his medication as prescribed and has not missed a dose. Care advice read to patient. Patient verbalized understanding. Call transferred to office for scheduling.  Reason for Disposition . Systolic BP  >= 99991111 OR Diastolic >= [id number]  Answer Assessment - Initial Assessment Questions 1. BLOOD PRESSURE: "What is the blood pressure?" "Did you take at least two measurements 5 minutes apart?"     118/220 , 118/186 2. ONSET: "When did you take your blood pressure?"     months 3. HOW: "How did you obtain the blood pressure?" (e.g., visiting nurse, automatic home BP monitor)     home 4. HISTORY: "Do you have a history of high blood pressure?"     yes 5. MEDICATIONS: "Are you taking any medications for blood pressure?" "Have you missed any doses recently?"     Taking all medications 6. OTHER SYMPTOMS: "Do you have any symptoms?" (e.g., headache, chest pain, blurred vision, difficulty breathing, weakness)    nothing 7. PREGNANCY: "Is there any chance you are pregnant?" "When was your last menstrual period?"    N/A  Protocols used: HIGH BLOOD PRESSURE-A-AH

## 2019-06-08 NOTE — Telephone Encounter (Signed)
See note

## 2019-06-09 ENCOUNTER — Other Ambulatory Visit: Payer: Self-pay

## 2019-06-09 ENCOUNTER — Ambulatory Visit (INDEPENDENT_AMBULATORY_CARE_PROVIDER_SITE_OTHER): Payer: Medicare PPO | Admitting: Family Medicine

## 2019-06-09 ENCOUNTER — Encounter: Payer: Self-pay | Admitting: Family Medicine

## 2019-06-09 DIAGNOSIS — I10 Essential (primary) hypertension: Secondary | ICD-10-CM

## 2019-06-09 MED ORDER — METOPROLOL TARTRATE 25 MG PO TABS: 25.0000 mg | ORAL_TABLET | Freq: Two times a day (BID) | ORAL | 3 refills | Status: DC

## 2019-06-09 MED ORDER — AMLODIPINE BESYLATE 10 MG PO TABS: 10.0000 mg | ORAL_TABLET | Freq: Every day | ORAL | 3 refills | Status: DC

## 2019-06-09 NOTE — Assessment & Plan Note (Signed)
Blood pressures are significantly elevated.  Discussed transport to ED however he declined.  He is not having any signs of ACS or endorgan damage at this point.  In light of patient's best care, we will try to manage as an outpatient.  We will start amlodipine 10 mg daily.  Will restart metoprolol 25 mg twice daily.  He will continue taking olmesartan 40 mg daily.  He will follow-up with me early next week.  Discussed reasons to return to care and seek urgent care.

## 2019-06-09 NOTE — Patient Instructions (Signed)
It was very nice to see you today!  Your blood pressure is very high.  Please restart the metoprolol.  Please start amlodipine.  Please continue the losartan.  Please come back early next week so that we can recheck your blood pressure.  If you develop any symptoms, please go straight to the emergency room or call 911.  Take care, Dr Jerline Pain  Please try these tips to maintain a healthy lifestyle:   Eat at least 3 REAL meals and 1-2 snacks per day.  Aim for no more than 5 hours between eating.  If you eat breakfast, please do so within one hour of getting up.    Obtain twice as many fruits/vegetables as protein or carbohydrate foods for both lunch and dinner. (Half of each meal should be fruits/vegetables, one quarter protein, and one quarter starchy carbs)   Cut down on sweet beverages. This includes juice, soda, and sweet tea.    Exercise at least 150 minutes every week.

## 2019-06-09 NOTE — Telephone Encounter (Signed)
Noted  

## 2019-06-09 NOTE — Progress Notes (Signed)
   Chief Complaint:  Omar Singh is a 68 y.o. male who presents today with a chief complaint of essential hypertension.   Assessment/Plan:  Essential hypertension Blood pressures are significantly elevated.  Discussed transport to ED however he declined.  He is not having any signs of ACS or endorgan damage at this point.  In light of patient's best care, we will try to manage as an outpatient.  We will start amlodipine 10 mg daily.  Will restart metoprolol 25 mg twice daily.  He will continue taking olmesartan 40 mg daily.  He will follow-up with me early next week.  Discussed reasons to return to care and seek urgent care.     Subjective:  HPI:  Patient stopped taking his metoprolol about a week ago.  Stated that he ran out of.  He has been taking Benicar daily.  He has been checking home blood pressures and they have been in the 200s.  He is not having any chest pain or shortness of breath.  No dizziness.  No leg swelling.  Not currently having any symptoms.  ROS: Per HPI  PMH: He reports that he quit smoking about 3 weeks ago. His smoking use included cigarettes. He has a 11.75 pack-year smoking history. He has never used smokeless tobacco. He reports previous alcohol use of about 56.0 standard drinks of alcohol per week. He reports that he does not use drugs.      Objective:  Physical Exam: BP (!) 232/121   Pulse 81   Temp 98.2 F (36.8 C)   Ht 5\' 11"  (1.803 m)   Wt 198 lb (89.8 kg)   SpO2 98%   BMI 27.62 kg/m   Gen: NAD, resting comfortably CV: Regular rate and rhythm with no murmurs appreciated Pulm: Normal work of breathing, clear to auscultation bilaterally with no crackles, wheezes, or rhonchi      Tyree Fluharty M. Jerline Pain, MD 06/09/2019 1:23 PM

## 2019-06-11 ENCOUNTER — Telehealth: Payer: Self-pay | Admitting: Family Medicine

## 2019-06-11 NOTE — Telephone Encounter (Signed)
I called the patient to schedule AWV with Courtney, but there was no answer and no option to leave a message. VDM (Dee-Dee) °

## 2019-06-19 ENCOUNTER — Encounter: Payer: Self-pay | Admitting: Family Medicine

## 2019-06-19 ENCOUNTER — Ambulatory Visit (INDEPENDENT_AMBULATORY_CARE_PROVIDER_SITE_OTHER): Payer: Medicare PPO | Admitting: Family Medicine

## 2019-06-19 ENCOUNTER — Other Ambulatory Visit: Payer: Self-pay

## 2019-06-19 VITALS — BP 175/86 | HR 64 | Temp 98.0°F | Ht 71.0 in | Wt 199.0 lb

## 2019-06-19 DIAGNOSIS — I10 Essential (primary) hypertension: Secondary | ICD-10-CM

## 2019-06-19 DIAGNOSIS — Z6827 Body mass index (BMI) 27.0-27.9, adult: Secondary | ICD-10-CM

## 2019-06-19 DIAGNOSIS — Z87891 Personal history of nicotine dependence: Secondary | ICD-10-CM | POA: Diagnosis not present

## 2019-06-19 DIAGNOSIS — E663 Overweight: Secondary | ICD-10-CM

## 2019-06-19 MED ORDER — OLMESARTAN MEDOXOMIL 40 MG PO TABS: 40.0000 mg | ORAL_TABLET | Freq: Every day | ORAL | 0 refills | Status: DC

## 2019-06-19 NOTE — Progress Notes (Signed)
   Chief Complaint:  Omar Singh is a 68 y.o. male who presents today with a chief complaint of HTN.   Assessment/Plan:  Essential hypertension Chronic problem.  Uncontrolled.  Better than last visit.  Continue amlodipine 10 mg daily, metoprolol tartrate 25 mg twice daily, and olmesartan 40 mg daily.  Follow-up in the in a few weeks.  If continues to be above goal, would consider addition of HCTZ to regimen.  Former smoker Congratulated patient on recent cessation.  Body mass index is 27.75 kg/m. / Overweight BMI Metric Follow Up - 06/19/19 1118      BMI Metric Follow Up-Please document annually   BMI Metric Follow Up  Education provided         Subjective:  HPI:  Patient seen about 10 days ago for very elevated blood pressure.  At that time blood pressure readings were 230s over 120s.  He has started on amlodipine 10 mg daily.  He has done well with this.  Tolerating medication well without side effects.  No reported chest pain or shortness of breath.  ROS: Per HPI  PMH: He reports that he quit smoking about 4 weeks ago. His smoking use included cigarettes. He has a 11.75 pack-year smoking history. He has never used smokeless tobacco. He reports previous alcohol use of about 56.0 standard drinks of alcohol per week. He reports that he does not use drugs.      Objective:  Physical Exam: BP (!) 175/86   Pulse 64   Temp 98 F (36.7 C)   Ht 5\' 11"  (1.803 m)   Wt 199 lb (90.3 kg)   SpO2 97%   BMI 27.75 kg/m   Gen: NAD, resting comfortably CV: Regular rate and rhythm with no murmurs appreciated Pulm: Normal work of breathing, clear to auscultation bilaterally with no crackles, wheezes, or rhonchi Neuro: Grossly normal, moves all extremities Psych: Normal affect and thought content      Linna Thebeau M. Jerline Pain, MD 06/19/2019 11:19 AM

## 2019-06-19 NOTE — Assessment & Plan Note (Signed)
Chronic problem.  Uncontrolled.  Better than last visit.  Continue amlodipine 10 mg daily, metoprolol tartrate 25 mg twice daily, and olmesartan 40 mg daily.  Follow-up in the in a few weeks.  If continues to be above goal, would consider addition of HCTZ to regimen.

## 2019-06-19 NOTE — Patient Instructions (Signed)
It was very nice to see you today!  I am glad that you quit smoking!  Your blood pressure is much better today.  Come back to see me in 4 weeks, or sooner if needed.   Take care, Dr Jerline Pain  Please try these tips to maintain a healthy lifestyle:   Eat at least 3 REAL meals and 1-2 snacks per day.  Aim for no more than 5 hours between eating.  If you eat breakfast, please do so within one hour of getting up.    Obtain twice as many fruits/vegetables as protein or carbohydrate foods for both lunch and dinner. (Half of each meal should be fruits/vegetables, one quarter protein, and one quarter starchy carbs)   Cut down on sweet beverages. This includes juice, soda, and sweet tea.    Exercise at least 150 minutes every week.

## 2019-06-19 NOTE — Assessment & Plan Note (Signed)
Congratulated patient on recent cessation.

## 2019-06-23 ENCOUNTER — Ambulatory Visit: Payer: Medicare PPO | Admitting: Family Medicine

## 2019-07-16 ENCOUNTER — Other Ambulatory Visit: Payer: Self-pay

## 2019-07-16 ENCOUNTER — Encounter (HOSPITAL_COMMUNITY): Payer: Self-pay | Admitting: Emergency Medicine

## 2019-07-16 ENCOUNTER — Ambulatory Visit (HOSPITAL_COMMUNITY)
Admission: EM | Admit: 2019-07-16 | Discharge: 2019-07-16 | Disposition: A | Discharge status: Home / Self Care | Payer: Medicare PPO | Attending: Family Medicine | Admitting: Family Medicine

## 2019-07-16 DIAGNOSIS — H66002 Acute suppurative otitis media without spontaneous rupture of ear drum, left ear: Secondary | ICD-10-CM | POA: Diagnosis not present

## 2019-07-16 MED ORDER — AMOXICILLIN 500 MG PO CAPS: 1000.0000 mg | ORAL_CAPSULE | Freq: Two times a day (BID) | ORAL | 0 refills | Status: DC

## 2019-07-16 NOTE — Discharge Instructions (Addendum)
This is an ear infection Take the antibiotics as prescribed  Follow up as needed for continued or worsening symptoms

## 2019-07-16 NOTE — ED Triage Notes (Signed)
Cannot hear well out of left ear for one week. HAppened after using Qtip. No pain

## 2019-07-16 NOTE — ED Provider Notes (Signed)
[place]    CSN: [id number] Arrival date & time: 07/16/19  1353      History   Chief Complaint Chief Complaint  Patient presents with  . Hearing Problem    HPI Omar Singh is a 68 y.o. male.   Patient is a 68 year old male presents today with left ear fullness.  Symptoms been constant.  Thought he had wax and has been using Q-tips to clean out the ear. Some decreased hearing. No fever, chills, body aches. No pain. No rhinorrhea, nasal congestion, cough, sore throat.  ROS per HPI      Past Medical History:  Diagnosis Date  . Acute exacerbation of chronic obstructive pulmonary disease (COPD) ([place])    [name] 03/23/2015  . Alcohol abuse   . CAD (coronary artery disease)    a. s/p multi-link vision stent to the mid RCA in 2007 at [facility].  . Hyperlipidemia   . Hypertension   . Myocardial infarction [facility]) 2008   [name] 03/23/2015  . PAD (peripheral artery disease) ([place])   . Panic attack   . Tobacco abuse     Patient Active Problem List   Diagnosis Date Noted  . Dyslipidemia 02/10/2019  . Essential hypertension 01/11/2019  . COPD (chronic obstructive pulmonary disease) ([place]) 01/10/2019  . PVD (peripheral vascular disease) ([place])   . Coronary artery disease involving coronary bypass graft of native heart with angina pectoris ([place])   . PAF (paroxysmal atrial fibrillation) ([place]) 03/26/2015  . CAD S/P RCA BMS '08, new RCA BMS 03/25/15 03/23/2015  . Pulmonary nodule 03/23/2015  . Alcohol abuse 03/23/2015  . Former smoker 03/23/2015    Past Surgical History:  Procedure Laterality Date  . CARDIAC CATHETERIZATION N/A 03/25/2015   Procedure: Left Heart Cath and Coronary Angiography;  Surgeon: Burnell Blanks, MD;  Location: [place] [facility];  Service: Cardiovascular;  Laterality: N/A;  . CARDIAC CATHETERIZATION N/A 03/25/2015   Procedure: Coronary Stent Intervention;  Surgeon: Burnell Blanks, MD;  Location: [place] [facility];  Service:  Cardiovascular;  Laterality: N/A;  . CORONARY ANGIOPLASTY WITH STENT PLACEMENT  2008   "1"  . FEMORAL-TIBIAL BYPASS GRAFT Right 01/08/2019   Procedure: RIGHT FEMORAL Endartarectomy and profundoplasty using greater saphenous vein graft. Right anterior tibial artery bypass with nonreversed saphenous vein graft;  Surgeon: Angelia Mould, MD;  Location: [facility] OR;  Service: Vascular;  Laterality: Right;  . LOWER EXTREMITY ANGIOGRAM Right 01/08/2019   Procedure: Lower Extremity Angiogram;  Surgeon: Angelia Mould, MD;  Location: [facility];  Service: Vascular;  Laterality: Right;  . LOWER EXTREMITY ANGIOGRAPHY N/A 01/06/2019   Procedure: LOWER EXTREMITY ANGIOGRAPHY;  Surgeon: Serafina Mitchell, MD;  Location: [place] [facility];  Service: Cardiovascular;  Laterality: N/A;  . TONSILLECTOMY         Home Medications    Prior to Admission medications   Medication Sig Start Date End Date Taking? Authorizing Provider  amLODipine (NORVASC) 10 MG tablet Take 1 tablet (10 mg total) by mouth daily. 06/09/19   Vivi Barrack, MD  amoxicillin (AMOXIL) 500 MG capsule Take 2 capsules (1,000 mg total) by mouth 2 (two) times daily for 5 days. 07/16/19 07/21/19  Loura Halt A, NP  aspirin EC 81 MG EC tablet Take 1 tablet (81 mg total) by mouth daily. 12/07/18   Nuala Alpha, DO  atorvastatin (LIPITOR) 40 MG tablet Take 1 tablet (40 mg total) by mouth daily at 6 PM. 12/07/18   Nuala Alpha, DO  diphenhydramine-acetaminophen (TYLENOL PM) 25-500 MG TABS tablet Take 2 tablets by mouth at bedtime.    [provider]  metoprolol tartrate (LOPRESSOR) 25 MG tablet Take 1 tablet (25 mg total) by mouth 2 (two) times daily. 06/09/19   Vivi Barrack, MD  olmesartan (BENICAR) 40 MG tablet Take 1 tablet (40 mg total) by mouth daily. 06/19/19   Vivi Barrack, MD    Family History Family History  Problem Relation Age of Onset  . CVA Other        Grandparents had strokes  . Cancer Neg Hx     Social  History Social History   Tobacco Use  . Smoking status: Former Smoker    Packs/day: 0.25    Years: 47.00    Pack years: 11.75    Types: Cigarettes    Quit date: 05/19/2019    Years since quitting: 0.1  . Smokeless tobacco: Never Used  Substance Use Topics  . Alcohol use: Not Currently    Alcohol/week: 56.0 standard drinks    Types: 56 Cans of beer per week    Comment: quit 1 year ago   . Drug use: No     Allergies   Patient has no known allergies.   Review of Systems Review of Systems   Physical Exam Triage Vital Signs ED Triage Vitals  Enc Vitals Group     BP 07/16/19 1428 (!) 147/90     Pulse Rate 07/16/19 1428 80     Resp 07/16/19 1428 16     Temp 07/16/19 1428 98.2 F (36.8 C)     Temp Source 07/16/19 1428 Temporal     SpO2 07/16/19 1428 94 %     Weight --      Height --      Head Circumference --      Peak Flow --      Pain Score 07/16/19 1447 0     Pain Loc --      Pain Edu? --      Excl. in [place]? --    No data found.  Updated Vital Signs BP (!) 147/90 (BP Location: Left Arm)   Pulse 80   Temp 98.2 F (36.8 C) (Temporal)   Resp 16   SpO2 94%   Visual Acuity Right Eye Distance:   Left Eye Distance:   Bilateral Distance:    Right Eye Near:   Left Eye Near:    Bilateral Near:     Physical Exam Vitals signs and nursing note reviewed.  Constitutional:      General: He is not in acute distress.    Appearance: Normal appearance. He is not ill-appearing, toxic-appearing or diaphoretic.  HENT:     Head: Normocephalic and atraumatic.     Right Ear: Tympanic membrane and ear canal normal.     Left Ear: Decreased hearing noted. No mastoid tenderness. Tympanic membrane is erythematous and retracted.     Nose: Nose normal.  Eyes:     Conjunctiva/sclera: Conjunctivae normal.  Neck:     Musculoskeletal: Normal range of motion.  Pulmonary:     Effort: Pulmonary effort is normal.  Musculoskeletal: Normal range of motion.  Skin:    General: Skin is  warm and dry.  Neurological:     Mental Status: He is alert.  Psychiatric:        Mood and Affect: Mood normal.      UC Treatments / Results  Labs (all labs ordered are listed, but only abnormal  results are displayed) Labs Reviewed - No data to display  EKG   Radiology No results found.  Procedures Procedures (including critical care time)  Medications Ordered in UC Medications - No data to display  Initial Impression / Assessment and Plan / UC Course  I have reviewed the triage vital signs and the nursing notes.  Pertinent labs & imaging results that were available during my care of the patient were reviewed by me and considered in my medical decision making (see chart for details).     Left otitis media-treating with amoxicillin Follow up as needed for continued or worsening symptoms  Final Clinical Impressions(s) / UC Diagnoses   Final diagnoses:  Non-recurrent acute suppurative otitis media of left ear without spontaneous rupture of tympanic membrane     Discharge Instructions     This is an ear infection Take the antibiotics as prescribed  Follow up as needed for continued or worsening symptoms     ED Prescriptions    Medication Sig Dispense Auth. Provider   amoxicillin (AMOXIL) 500 MG capsule Take 2 capsules (1,000 mg total) by mouth 2 (two) times daily for 5 days. 20 capsule Loura Halt A, NP     PDMP not reviewed this encounter.   Loura Halt A, NP 07/16/19 1506

## 2019-07-20 ENCOUNTER — Other Ambulatory Visit: Payer: Self-pay

## 2019-07-20 DIAGNOSIS — R0989 Other specified symptoms and signs involving the circulatory and respiratory systems: Secondary | ICD-10-CM

## 2019-07-20 DIAGNOSIS — I779 Disorder of arteries and arterioles, unspecified: Secondary | ICD-10-CM

## 2019-07-21 ENCOUNTER — Ambulatory Visit (INDEPENDENT_AMBULATORY_CARE_PROVIDER_SITE_OTHER): Payer: Medicare PPO | Admitting: Family Medicine

## 2019-07-21 ENCOUNTER — Encounter: Payer: Self-pay | Admitting: Family Medicine

## 2019-07-21 ENCOUNTER — Other Ambulatory Visit: Payer: Self-pay | Admitting: Family Medicine

## 2019-07-21 VITALS — BP 154/62 | HR 82 | Temp 97.9°F | Ht 71.0 in | Wt 204.4 lb

## 2019-07-21 DIAGNOSIS — H9192 Unspecified hearing loss, left ear: Secondary | ICD-10-CM

## 2019-07-21 DIAGNOSIS — L299 Pruritus, unspecified: Secondary | ICD-10-CM | POA: Diagnosis not present

## 2019-07-21 MED ORDER — HYDROCORTISONE 2.5 % EX CREA: TOPICAL_CREAM | Freq: Two times a day (BID) | CUTANEOUS | 0 refills | Status: DC

## 2019-07-21 MED ORDER — CIPROFLOXACIN-DEXAMETHASONE 0.3-0.1 % OT SUSP: 4.0000 [drp] | Freq: Two times a day (BID) | OTIC | 0 refills | Status: DC

## 2019-07-21 MED ORDER — PREDNISONE 50 MG PO TABS: ORAL_TABLET | ORAL | 0 refills | Status: DC

## 2019-07-21 NOTE — Patient Instructions (Signed)
It was very nice to see you today!  Please start the prednisone.  Use a cortisone cream for your ears.  Use the drops for the inner ear.  Let me know if your symptoms are not improving by next week.  Take care, Dr Jerline Pain  Please try these tips to maintain a healthy lifestyle:   Eat at least 3 REAL meals and 1-2 snacks per day.  Aim for no more than 5 hours between eating.  If you eat breakfast, please do so within one hour of getting up.    Obtain twice as many fruits/vegetables as protein or carbohydrate foods for both lunch and dinner. (Half of each meal should be fruits/vegetables, one quarter protein, and one quarter starchy carbs)   Cut down on sweet beverages. This includes juice, soda, and sweet tea.    Exercise at least 150 minutes every week.

## 2019-07-21 NOTE — Progress Notes (Signed)
    Chief Complaint:  Omar Singh is a 68 y.o. male who presents today with a chief complaint of hearing loss.   Assessment/Plan:  Hearing Loss Due to middle ear effusion. Has not responded to amoxicillin.  We will start prednisone.  If no improvement will need referral to ENT.  Pruritus Start Ciprodex drops for ear canal and hydrocortisone for out ear.     Subjective:  HPI:  Hearing Loss Started 2 weeks ago.  Was using a Q-tip in his left ear immediately noticed hearing loss.  Went urgent care few days ago.  Diagnosed with ear infection.  Was started on amoxicillin.  Symptoms have not improved.  No pain.  No drainage.  No fevers or chills.  He has had some itching on his outer ears as well. Has tried OTC drops with no improvement. Symptoms are stable. No other obvious aggravating or alleviating factors.   ROS: Per HPI  PMH: He reports that he quit smoking about 2 months ago. His smoking use included cigarettes. He has a 11.75 pack-year smoking history. He has never used smokeless tobacco. He reports previous alcohol use of about 56.0 standard drinks of alcohol per week. He reports that he does not use drugs.      Objective:  Physical Exam: BP (!) 154/62   Pulse 82   Temp 97.9 F (36.6 C)   Ht 5\' 11"  (1.803 m)   Wt 204 lb 6.1 oz (92.7 kg)   SpO2 93%   BMI 28.51 kg/m   Gen: NAD, resting comfortably HEENT: Right TM clear. Left TM erythematous and retracted.      Algis Greenhouse. Jerline Pain, MD 07/21/2019 11:35 AM

## 2019-07-27 ENCOUNTER — Telehealth: Payer: Self-pay | Admitting: Family Medicine

## 2019-07-27 ENCOUNTER — Ambulatory Visit (INDEPENDENT_AMBULATORY_CARE_PROVIDER_SITE_OTHER)
Admission: RE | Admit: 2019-07-27 | Discharge: 2019-07-27 | Disposition: A | Discharge status: Home / Self Care | Payer: Medicare PPO | Source: Ambulatory Visit | Attending: Family | Admitting: Family

## 2019-07-27 ENCOUNTER — Other Ambulatory Visit: Payer: Self-pay

## 2019-07-27 ENCOUNTER — Ambulatory Visit (HOSPITAL_COMMUNITY)
Admission: RE | Admit: 2019-07-27 | Discharge: 2019-07-27 | Disposition: A | Discharge status: Home / Self Care | Payer: Medicare PPO | Source: Ambulatory Visit | Attending: Family | Admitting: Family

## 2019-07-27 DIAGNOSIS — R0989 Other specified symptoms and signs involving the circulatory and respiratory systems: Secondary | ICD-10-CM | POA: Diagnosis not present

## 2019-07-27 DIAGNOSIS — I779 Disorder of arteries and arterioles, unspecified: Secondary | ICD-10-CM

## 2019-07-27 DIAGNOSIS — H9192 Unspecified hearing loss, left ear: Secondary | ICD-10-CM

## 2019-07-27 NOTE — Telephone Encounter (Signed)
Pt called.  States that he was being treated for an ear infection - pt was given medication and finished that today.  Pt is still seeing no improvement.  Pt states that this is left ear only.

## 2019-07-27 NOTE — Telephone Encounter (Signed)
Please advise 

## 2019-07-27 NOTE — Telephone Encounter (Signed)
See note

## 2019-07-27 NOTE — Telephone Encounter (Signed)
Please place referral to ENT.  Algis Greenhouse. Jerline Pain, MD 07/27/2019 2:46 PM

## 2019-07-27 NOTE — Telephone Encounter (Signed)
Notified patient of referral placed

## 2019-07-30 ENCOUNTER — Other Ambulatory Visit (HOSPITAL_COMMUNITY): Payer: Self-pay | Admitting: Family

## 2019-07-30 ENCOUNTER — Encounter: Payer: Self-pay | Admitting: Family

## 2019-07-30 ENCOUNTER — Ambulatory Visit (INDEPENDENT_AMBULATORY_CARE_PROVIDER_SITE_OTHER): Payer: Medicare PPO | Admitting: Family

## 2019-07-30 ENCOUNTER — Ambulatory Visit (HOSPITAL_COMMUNITY)
Admission: RE | Admit: 2019-07-30 | Discharge: 2019-07-30 | Disposition: A | Discharge status: Home / Self Care | Payer: Medicare PPO | Source: Ambulatory Visit | Attending: Family | Admitting: Family

## 2019-07-30 ENCOUNTER — Other Ambulatory Visit: Payer: Self-pay

## 2019-07-30 VITALS — BP 142/80 | HR 84 | Temp 98.0°F | Resp 14 | Ht 71.0 in | Wt 201.0 lb

## 2019-07-30 DIAGNOSIS — M7989 Other specified soft tissue disorders: Secondary | ICD-10-CM | POA: Diagnosis not present

## 2019-07-30 DIAGNOSIS — Z87891 Personal history of nicotine dependence: Secondary | ICD-10-CM

## 2019-07-30 DIAGNOSIS — I779 Disorder of arteries and arterioles, unspecified: Secondary | ICD-10-CM

## 2019-07-30 DIAGNOSIS — I6523 Occlusion and stenosis of bilateral carotid arteries: Secondary | ICD-10-CM

## 2019-07-30 DIAGNOSIS — I890 Lymphedema, not elsewhere classified: Secondary | ICD-10-CM | POA: Diagnosis not present

## 2019-07-30 NOTE — Progress Notes (Signed)
[facility] [place]   CC: Follow up peripheral artery occlusive disease  History of Present Illness Omar Singh is a 68 y.o. male who presented with rest Singh of the right foot and had multilevel arterial occlusive disease. Given his critical limb ischemia he was taken to the operating room on 01/08/19 and underwent extensive endarterectomy of the right external iliac artery, common femoral artery, and deep femoral artery with profundoplasty using a vein patch by Dr. Scot Singh. He also had right femoral to anterior tibial artery bypass with a non-reversed vein graft.   Dr. Scot Singh last evaluated pt on 02-04-19. At that time his bypass graft was patent with an easily palpable pulse along the lateral aspect of his leg. Dr. Scot Singh had a long discussion with him about the importance of tobacco cessation, and discussed the importance of leg elevation given his leg swelling. He is on aspirin and is on a statin. Follow up ABIs and a graft duplex in 3 months and we will have him see the nurse practitioner or physician assistant at that time. He knows to call sooner if he has problems. The patient's initial blood pressure that day was elevated. We repeated this and this was still elevated. The patient did not have a primary care physician. Dr. Scot Singh referred him to Dr. Rachell Singh.  Dr. Jerline Singh in [place] is his PCP, per pt.   He states that he can walk as far as he wants without Singh or fatigue in his legs.  He still has swelling in his right leg.  He is on aspirin and is on a statin.  Right foot numbness started 3 weeks ago. Right calf and thigh have been swollen since the right leg revascularization per pt. He denies dyspnea, denies chest Singh.   He has 2 cardiac stents, he denies any hx of MI. He denies any hx of stroke or TIA.   Diabetic: No Tobacco use: former smoker  (quit 05-19-19, was 1 ppd, started at age 16 yrs)  Pt meds include: Statin  :Yes Betablocker: yes ASA: Yes Other anticoagulants/antiplatelets: no   Past Medical History:  Diagnosis Date  . Acute exacerbation of chronic obstructive pulmonary disease (COPD) ([place])    Omar Singh 03/23/2015  . Alcohol abuse   . CAD (coronary artery disease)    a. s/p multi-link vision stent to the mid RCA in 2007 at [facility].  . Hyperlipidemia   . Hypertension   . Myocardial infarction [facility]) 2008   Omar Singh 03/23/2015  . PAD (peripheral artery disease) ([place])   . Panic attack   . Tobacco abuse     Social History Social History   Tobacco Use  . Smoking status: Former Smoker    Packs/day: 0.25    Years: 47.00    Pack years: 11.75    Types: Cigarettes    Quit date: 05/19/2019    Years since quitting: 0.1  . Smokeless tobacco: Never Used  Substance Use Topics  . Alcohol use: Not Currently    Alcohol/week: 56.0 standard drinks    Types: 56 Cans of beer per week    Comment: quit 1 year ago   . Drug use: No    Family History Family History  Problem Relation Age of Onset  . CVA Other        Grandparents had strokes  . Cancer Neg Hx     Past Surgical History:  Procedure Laterality Date  . CARDIAC CATHETERIZATION N/A 03/25/2015   Procedure: Left Heart  Cath and Coronary Angiography;  Surgeon: Omar Blanks, MD;  Location: [place] [facility];  Service: Cardiovascular;  Laterality: N/A;  . CARDIAC CATHETERIZATION N/A 03/25/2015   Procedure: Coronary Stent Intervention;  Surgeon: Omar Blanks, MD;  Location: [place] [facility];  Service: Cardiovascular;  Laterality: N/A;  . CORONARY ANGIOPLASTY WITH STENT PLACEMENT  2008   "1"  . FEMORAL-TIBIAL BYPASS GRAFT Right 01/08/2019   Procedure: RIGHT FEMORAL Endartarectomy and profundoplasty using greater saphenous vein graft. Right anterior tibial artery bypass with nonreversed saphenous vein graft;  Surgeon: Angelia Mould, MD;  Location: [facility] OR;  Service: Vascular;  Laterality: Right;  . LOWER EXTREMITY  ANGIOGRAM Right 01/08/2019   Procedure: Lower Extremity Angiogram;  Surgeon: Angelia Mould, MD;  Location: [place];  Service: Vascular;  Laterality: Right;  . LOWER EXTREMITY ANGIOGRAPHY N/A 01/06/2019   Procedure: LOWER EXTREMITY ANGIOGRAPHY;  Surgeon: Omar Mitchell, MD;  Location: [place] [facility];  Service: Cardiovascular;  Laterality: N/A;  . TONSILLECTOMY      No Known Allergies  Current Outpatient Medications  Medication Sig Dispense Refill  . amLODipine (NORVASC) 10 MG tablet Take 1 tablet (10 mg total) by mouth daily. 90 tablet 3  . aspirin EC 81 MG EC tablet Take 1 tablet (81 mg total) by mouth daily. 30 tablet 0  . atorvastatin (LIPITOR) 40 MG tablet Take 1 tablet (40 mg total) by mouth daily at 6 PM. 30 tablet 0  . diphenhydramine-acetaminophen (TYLENOL PM) 25-500 MG TABS tablet Take 2 tablets by mouth at bedtime.    . hydrocortisone 2.5 % cream Apply topically 2 (two) times daily. 30 g 0  . metoprolol tartrate (LOPRESSOR) 25 MG tablet Take 1 tablet (25 mg total) by mouth 2 (two) times daily. 180 tablet 3  . olmesartan (BENICAR) 40 MG tablet Take 1 tablet (40 mg total) by mouth daily. 90 tablet 0  . predniSONE (DELTASONE) 50 MG tablet Take 1 tablet daily for 5 days. Then take 1/2 tablet daily for 2 days. 6 tablet 0   No current facility-administered medications for this visit.     ROS: See HPI for pertinent positives and negatives.   Physical Examination  Vitals:   07/30/19 1517  BP: (!) 142/80  Pulse: 84  Resp: 14  Temp: 98 F (36.7 C)  TempSrc: Temporal  SpO2: 97%  Weight: 201 lb (91.2 kg)  Height: 5\' 11"  (1.803 m)   Body mass index is 28.03 kg/m.  General: A&O x 3, WDWN, male in NAD. Gait: s;ight limp HENT: No gross abnormalities.  Eyes: Pupils are equal and round Pulmonary: Respirations are non labored, CTAB, fair air movement in all fields Cardiac: regular rhythm, no detected murmur.         Carotid Bruits Right Left   Positive  Negative    Radial pulses are 2+ palpable bilaterally   Adominal aortic pulse is not palpable                         VASCULAR EXAM: Extremities without ischemic changes, without Gangrene; without open wounds. 2+ pitting edema in bilateral ankles. Pretibial edema: Right: 2+ non pitting, 1+ pitting. Left: 1+ pitting.  LE Pulses Right Left       FEMORAL  2+ palpable  not palpable        POPLITEAL  not palpable   not palpable       POSTERIOR TIBIAL  not palpable   not palpable        DORSALIS PEDIS      ANTERIOR TIBIAL not palpable  not palpable    Abdomen: soft, NT, no palpable masses. Skin: no rashes, no cellulitis, no ulcers noted. Musculoskeletal: no muscle wasting or atrophy.  Neurologic: A&O X 3; appropriate affect, Sensation is normal; MOTOR FUNCTION:  moving all extremities equally, motor strength 5/5 throughout. Speech is fluent/normal. CN 2-12 intact. Psychiatric: Thought content is normal, mood appropriate for clinical situation.    DATA  Right LE Venous Duplex (07-30-19): +---------+---------------+---------+-----------+----------+--------------+ RIGHT    CompressibilityPhasicitySpontaneityPropertiesThrombus Aging +---------+---------------+---------+-----------+----------+--------------+ CFV      Full           Yes      Yes                                 +---------+---------------+---------+-----------+----------+--------------+ SFJ      Full           Yes      Yes                                 +---------+---------------+---------+-----------+----------+--------------+ FV Prox  Full           Yes      Yes                                 +---------+---------------+---------+-----------+----------+--------------+ FV Mid   Full           Yes      Yes                                  +---------+---------------+---------+-----------+----------+--------------+ FV DistalFull           Yes      Yes                                 +---------+---------------+---------+-----------+----------+--------------+ PFV      Full           Yes      Yes                                 +---------+---------------+---------+-----------+----------+--------------+ POP      Full           Yes      Yes                                 +---------+---------------+---------+-----------+----------+--------------+ PTV      Full           Yes      Yes                                 +---------+---------------+---------+-----------+----------+--------------+ PERO  Yes      Yes                                 +---------+---------------+---------+-----------+----------+--------------+  Right SFJ not well visualized due to the proximal anastamosis of a Femoral Artery- Posterior Tibial Artery BPG. Sub-optimal visualization of the right peroneal veins on 2D imaging, but are patent on color doppler imaging.    +----+---------------+---------+-----------+----------+--------------+ LEFTCompressibilityPhasicitySpontaneityPropertiesThrombus Aging +----+---------------+---------+-----------+----------+--------------+ CFV Full           Yes      Yes                                 +----+---------------+---------+-----------+----------+--------------+ SFJ Full           Yes      Yes                                 +----+---------------+---------+-----------+----------+--------------+ Summary: Right: There is no evidence of deep vein thrombosis in the lower extremity. Left: No evidence of common femoral vein obstruction.   Carotid Duplex (07-27-19): Right Carotid: Velocities in the right ICA are consistent with a 40-59% stenosis in the proximal to mid segment.. Left Carotid: Velocities in the left ICA are consistent with a 40-59%  stenosis. Subclavians: Normal flow hemodynamics were seen in bilateral subclavian arteries. Bilateral vertebral artery flow is antegrade   Right LE Arterial Duplex (07-27-19): Right Graft #1: +------------------+--------+---------------+--------+-----------------+                   PSV cm/sStenosis       WaveformComments          +------------------+--------+---------------+--------+-----------------+ Inflow            205                    biphasic                  +------------------+--------+---------------+--------+-----------------+ Prox Anastomosis  39                     biphasicvisualized plaque +------------------+--------+---------------+--------+-----------------+ Proximal Graft    30                     biphasic                  +------------------+--------+---------------+--------+-----------------+ Mid Graft         99                     biphasic                  +------------------+--------+---------------+--------+-----------------+ Distal Graft      259     50-70% stenosisbiphasicslight curve      +------------------+--------+---------------+--------+-----------------+ Distal Anastomosis252     50-70% stenosisbiphasic                  +------------------+--------+---------------+--------+-----------------+ Outflow           49                     biphasic                  +------------------+--------+---------------+--------+-----------------+ Summary: Right: Patent right femoral to anterior tibial bypass graft with increased velocity (50 - 74%) at the  distal external iliac artery/ common femoral artey. Increased velocity at the distal graft and anastamosis in the 50 - 70% stenosis range, however there  is a slight curve and caliber change of outflow artery. Increased stenosis in the distal graft, less stenosis in the outflow compared to the exam on 05-07-19; all biphasic waveforms.    ABI Findings  (07-27-19): +---------+------------------+-----+--------+--------+ Right    Rt Pressure (mmHg)IndexWaveformComment  +---------+------------------+-----+--------+--------+ Brachial 158                                     +---------+------------------+-----+--------+--------+ PTA      165               1.04 biphasic         +---------+------------------+-----+--------+--------+ DP       171               1.08 biphasic         +---------+------------------+-----+--------+--------+ Adair Patter               0.84 Normal           +---------+------------------+-----+--------+--------+  +---------+------------------+-----+----------+-------+ Left     Lt Pressure (mmHg)IndexWaveform  Comment +---------+------------------+-----+----------+-------+ Brachial 158                                      +---------+------------------+-----+----------+-------+ PTA      124               0.78 monophasic        +---------+------------------+-----+----------+-------+ DP       106               0.67 monophasic        +---------+------------------+-----+----------+-------+ Great Toe88                0.56 Abnormal          +---------+------------------+-----+----------+-------+  +-------+-----------+-----------+------------+------------+ ABI/TBIToday's ABIToday's TBIPrevious ABIPrevious TBI +-------+-----------+-----------+------------+------------+ Right  1.08       0.84       0.89        0.49         +-------+-----------+-----------+------------+------------+ Left   0.78       0.56       0.55        0.44         +-------+-----------+-----------+------------+------------+ Right ABIs and TBIs appear increased. Summary: Right: Resting right ankle-brachial index is within normal range. No evidence of significant right lower extremity arterial disease. The right toe-brachial index is normal. Left: Resting left ankle-brachial  index indicates moderate left lower extremity arterial disease. The left toe-brachial index is abnormal.   Arteriogram of lower extremities (01-06-19): Findings:  Aortogram:No significant renal artery stenosis. The infrarenal abdominal aorta is widely patent. The right common external iliac artery are patent. The left common and external iliac artery are occluded. Right Lower Extremity:The right common femoral artery is calcified but patent. The profundofemoral artery is also patent the superficial femoral artery is occluded. The below-knee popliteal artery does appear to reconstitute however it appears diffusely diseased with stenosis at the origin of all 3 tibial vessels. The anterior tibial artery appears to be the best runoff vessel although it is diminutive. Left Lower Extremity:There appears to be reconstitution of the distal common femoral artery. The profundofemoral artery does fill the upper thigh. The superficial femoral  artery is heavily calcified but patent as is the popliteal artery. There is disease three-vessel runoff. Images are very limited given proximal obstruction.    Assessment and Plan: Omar Singh is a 68 y.o. male who is s/p extensive endarterectomy of the right external iliac artery, common femoral artery, and deep femoral artery with profundoplasty using a vein patch by Dr. Scot Singh on 01-08-19. He also had right femoral to anterior tibial artery bypass with a non-reversed vein graft.    There are no signs of ischemia in his feet or legs.  He can walk as far as he wants with no fatigue or Singh in his legs. He still has some pitting and non pitting edema in his right lower leg, pitting edema in his left lower leg which seems to be dependent edema. Pt states the swelling in his legs does not decrease by morning with overnight elevation of his legs.   Right lower extremity arterial duplex on 07-27-19 showed increased  stenosis in the distal graft, less stenosis in the outflow compared to the exam on 05-07-19; all biphasic waveforms. Bilateral ABI and TBI on 07-27-19 have improved: normal in the right with biphasic waveforms, moderate disease in the left with monophasic waveforms.   Right carotid bruit with no history of stroke or TIA; carotid duplex on 07-27-19 showed 40-59% bilateral ICA stenosis.   Right leg swelling since the right LE revascularization procedures in April 2020; right foot numbness started 3 weeks ago. He states that he has been elevating his leg. Right LE venous duplex today shows no evidence of deep vein thrombosis in the lower extremity. I discussed the right leg pitting and non pitting edema with Dr. Oneida Alar. Several lymphatic channels are severed during this procedure. Some people retain some degree of edema after lower extremity arterial bypass. Advised thigh high graduated compression hose, 20-30 mm Hg pressure, and elevation of feet above heart several times per day and overnight. Information given to pt re discount outlet for compression hose in .    PLAN:  Based on the patient's vascular studies and examination, pt will return to clinic in 6 months with right LE arterial duplex and ABIs. Carotid duplex in a year. Walk a total of at least 30 minutes daily in a safe environment.  I advised pt to notify us if he develops concerns re the circulation in his feet or legs.  I discussed in depth with the patient the nature of atherosclerosis, and emphasized the importance of maximal medical management including strict control of blood pressure, blood glucose, and lipid levels, obtaining regular exercise, and continued cessation of smoking.  The patient is aware that without maximal medical management the underlying atherosclerotic disease process will progress, limiting the benefit of any interventions.  The patient was given information about PAD including signs, symptoms, treatment,  what symptoms should prompt the patient to seek immediate medical care, and risk reduction measures to take.  Clemon Chambers, RN, MSN, FNP-C [facility] Phone: [phone]  Clinic MD: Oneida Alar   07/30/19 4:16 PM

## 2019-07-30 NOTE — Patient Instructions (Addendum)
To decrease swelling in your feet and legs: Elevate feet above slightly bent knees, feet above heart, overnight and 3-4 times per day for 20 minutes.    Stroke Prevention Some medical conditions and lifestyle choices can lead to a higher risk for a stroke. You can help to prevent a stroke by making nutrition, lifestyle, and other changes. What nutrition changes can be made?   Eat healthy foods. ? Choose foods that are high in fiber. These include:  Fresh fruits.  Fresh vegetables.  Whole grains. ? Eat at least 5 or more servings of fruits and vegetables each day. Try to fill half of your plate at each meal with fruits and vegetables. ? Choose lean protein foods. These include:  Lowfat (lean) cuts of meat.  Chicken without skin.  Fish.  Tofu.  Beans.  Nuts. ? Eat low-fat dairy products. ? Avoid foods that:  Are high in salt (sodium).  Have saturated fat.  Have trans fat.  Have cholesterol.  Are processed.  Are premade.  Follow eating guidelines as told by your doctor. These may include: ? Reducing how many calories you eat and drink each day. ? Limiting how much salt you eat or drink each day to 1,500 milligrams (mg). ? Using only healthy fats for cooking. These include:  Olive oil.  Canola oil.  Sunflower oil. ? Counting how many carbohydrates you eat and drink each day. What lifestyle changes can be made?  Try to stay at a healthy weight. Talk to your doctor about what a good weight is for you.  Get at least 30 minutes of moderate physical activity at least 5 days a week. This can include: ? Fast walking. ? Biking. ? Swimming.  Do not use any products that have nicotine or tobacco. This includes cigarettes and e-cigarettes. If you need help quitting, ask your doctor. Avoid being around tobacco smoke in general.  Limit how much alcohol you drink to no more than 1 drink a day for nonpregnant women and 2 drinks a day for men. One drink equals 12 oz  of beer, 5 oz of wine, or 1 oz of hard liquor.  Do not use drugs.  Avoid taking birth control pills. Talk to your doctor about the risks of taking birth control pills if: ? You are over 35 years old. ? You smoke. ? You get migraines. ? You have had a blood clot. What other changes can be made?  Manage your cholesterol. ? It is important to eat a healthy diet. ? If your cholesterol cannot be managed through your diet, you may also need to take medicines. Take medicines as told by your doctor.  Manage your diabetes. ? It is important to eat a healthy diet and to exercise regularly. ? If your blood sugar cannot be managed through diet and exercise, you may need to take medicines. Take medicines as told by your doctor.  Control your high blood pressure (hypertension). ? Try to keep your blood pressure below 130/80. This can help lower your risk of stroke. ? It is important to eat a healthy diet and to exercise regularly. ? If your blood pressure cannot be managed through diet and exercise, you may need to take medicines. Take medicines as told by your doctor. ? Ask your doctor if you should check your blood pressure at home. ? Have your blood pressure checked every year. Do this even if your blood pressure is normal.  Talk to your doctor about getting checked for a   sleep disorder. Signs of this can include: ? Snoring a lot. ? Feeling very tired.  Take over-the-counter and prescription medicines only as told by your doctor. These may include aspirin or blood thinners (antiplatelets or anticoagulants).  Make sure that any other medical conditions you have are managed. Where to find more information  American Stroke Association: www.strokeassociation.org  National Stroke Association: www.stroke.org Get help right away if:  You have any symptoms of stroke. "BE FAST" is an easy way to remember the main warning signs: ? B - Balance. Signs are dizziness, sudden trouble walking, or loss  of balance. ? E - Eyes. Signs are trouble seeing or a sudden change in how you see. ? F - Face. Signs are sudden weakness or loss of feeling of the face, or the face or eyelid drooping on one side. ? A - Arms. Signs are weakness or loss of feeling in an arm. This happens suddenly and usually on one side of the body. ? S - Speech. Signs are sudden trouble speaking, slurred speech, or trouble understanding what people say. ? T - Time. Time to call emergency services. Write down what time symptoms started.  You have other signs of stroke, such as: ? A sudden, very bad headache with no known cause. ? Feeling sick to your stomach (nausea). ? Throwing up (vomiting). ? Jerky movements you cannot control (seizure). These symptoms may represent a serious problem that is an emergency. Do not wait to see if the symptoms will go away. Get medical help right away. Call your local emergency services (911 in the [place] Do not drive yourself to the hospital. Summary  You can prevent a stroke by eating healthy, exercising, not smoking, drinking less alcohol, and treating other health problems, such as diabetes, high blood pressure, or high cholesterol.  Do not use any products that contain nicotine or tobacco, such as cigarettes and e-cigarettes.  Get help right away if you have any signs or symptoms of a stroke. This information is not intended to replace advice given to you by your health care provider. Make sure you discuss any questions you have with your health care provider. Document Released: 03/04/2012 Document Revised: 10/30/2018 Document Reviewed: 12/05/2016 Elsevier Patient Education  2020 Elsevier Inc.     Peripheral Vascular Disease  Peripheral vascular disease (PVD) is a disease of the blood vessels that are not part of your heart and brain. A simple term for PVD is poor circulation. In most cases, PVD narrows the blood vessels that carry blood from your heart to the rest of your body.  This can reduce the supply of blood to your arms, legs, and internal organs, like your stomach or kidneys. However, PVD most often affects a person's lower legs and feet. Without treatment, PVD tends to get worse. PVD can also lead to acute ischemic limb. This is when an arm or leg suddenly cannot get enough blood. This is a medical emergency. Follow these instructions at home: Lifestyle  Do not use any products that contain nicotine or tobacco, such as cigarettes and e-cigarettes. If you need help quitting, ask your doctor.  Lose weight if you are overweight. Or, stay at a healthy weight as told by your doctor.  Eat a diet that is low in fat and cholesterol. If you need help, ask your doctor.  Exercise regularly. Ask your doctor for activities that are right for you. General instructions  Take over-the-counter and prescription medicines only as told by your doctor.  Take   good care of your feet: ? Wear comfortable shoes that fit well. ? Check your feet often for any cuts or sores.  Keep all follow-up visits as told by your doctor This is important. Contact a doctor if:  You have cramps in your legs when you walk.  You have leg pain when you are at rest.  You have coldness in a leg or foot.  Your skin changes.  You are unable to get or have an erection (erectile dysfunction).  You have cuts or sores on your feet that do not heal. Get help right away if:  Your arm or leg turns cold, numb, and blue.  Your arms or legs become red, warm, swollen, painful, or numb.  You have chest pain.  You have trouble breathing.  You suddenly have weakness in your face, arm, or leg.  You become very confused or you cannot speak.  You suddenly have a very bad headache.  You suddenly cannot see. Summary  Peripheral vascular disease (PVD) is a disease of the blood vessels.  A simple term for PVD is poor circulation. Without treatment, PVD tends to get worse.  Treatment may include  exercise, low fat and low cholesterol diet, and quitting smoking. This information is not intended to replace advice given to you by your health care provider. Make sure you discuss any questions you have with your health care provider. Document Released: 11/28/2009 Document Revised: 08/16/2017 Document Reviewed: 10/11/2016 Elsevier Patient Education  2020 Elsevier Inc.     

## 2019-08-04 ENCOUNTER — Other Ambulatory Visit: Payer: Self-pay

## 2019-08-04 DIAGNOSIS — I779 Disorder of arteries and arterioles, unspecified: Secondary | ICD-10-CM

## 2019-09-15 ENCOUNTER — Telehealth: Payer: Self-pay | Admitting: Family Medicine

## 2019-09-15 NOTE — Telephone Encounter (Signed)
I left a message asking the patient to call and schedule Medicare AWV with Courtney ([facility]).  If patient calls back, please schedule Medicare Wellness Visit (initial) at next available opening.  VDM (Dee-Dee) 

## 2019-09-24 ENCOUNTER — Other Ambulatory Visit: Payer: Self-pay

## 2019-09-24 ENCOUNTER — Telehealth: Payer: Self-pay | Admitting: Family Medicine

## 2019-09-24 ENCOUNTER — Ambulatory Visit (INDEPENDENT_AMBULATORY_CARE_PROVIDER_SITE_OTHER): Payer: Medicare PPO

## 2019-09-24 VITALS — BP 136/80

## 2019-09-24 DIAGNOSIS — Z0000 Encounter for general adult medical examination without abnormal findings: Secondary | ICD-10-CM | POA: Diagnosis not present

## 2019-09-24 NOTE — Progress Notes (Signed)
This visit is being conducted via phone call due to the COVID-19 pandemic. This patient has given me verbal consent via phone to conduct this visit, patient states they are participating from their home address. Some vital signs may be absent or patient reported.   Patient identification: identified by name, DOB, and current address.  Location provider: [facility], Office Persons participating in the virtual visit: Denman George LPN, patient, and Dr. Dimas Chyle     Subjective:   Omar Singh is a 69 y.o. male who presents for an Initial Medicare Annual Wellness Visit.  Review of Systems   Cardiac Risk Factors include: male gender;hypertension;advanced age (>28men, >98 women);dyslipidemia   Objective:    Today's Vitals   09/24/19 1027  BP: 136/80   There is no height or weight on file to calculate BMI.  Advanced Directives 02/04/2019 01/11/2019 01/10/2019 01/06/2019 12/05/2018 04/04/2015 03/23/2015  Does Patient Have a Medical Advance Directive? No No No No No No Yes  Does patient want to make changes to medical advance directive? - - - - - - No - Patient declined  Copy of [place] in Chart? - - - - - - No - copy requested  Would patient like information on creating a medical advance directive? No - Patient declined No - Guardian declined No - Patient declined No - Guardian declined No - Patient declined - -    Current Medications (verified) Outpatient Encounter Medications as of 09/24/2019  Medication Sig  . amLODipine (NORVASC) 10 MG tablet Take 1 tablet (10 mg total) by mouth daily.  Marland Kitchen aspirin EC 81 MG EC tablet Take 1 tablet (81 mg total) by mouth daily.  Marland Kitchen atorvastatin (LIPITOR) 40 MG tablet Take 1 tablet (40 mg total) by mouth daily at 6 PM.  . diphenhydramine-acetaminophen (TYLENOL PM) 25-500 MG TABS tablet Take 2 tablets by mouth at bedtime.  . hydrocortisone 2.5 % cream Apply topically 2 (two) times daily.  . metoprolol tartrate (LOPRESSOR) 25 MG  tablet Take 1 tablet (25 mg total) by mouth 2 (two) times daily.  Marland Kitchen olmesartan (BENICAR) 40 MG tablet Take 1 tablet (40 mg total) by mouth daily.  . [DISCONTINUED] predniSONE (DELTASONE) 50 MG tablet Take 1 tablet daily for 5 days. Then take 1/2 tablet daily for 2 days.   No facility-administered encounter medications on file as of 09/24/2019.    Allergies (verified) Patient has no known allergies.   History: Past Medical History:  Diagnosis Date  . Acute exacerbation of chronic obstructive pulmonary disease (COPD) ([place])    [name] 03/23/2015  . Alcohol abuse   . CAD (coronary artery disease)    a. s/p multi-link vision stent to the mid RCA in 2007 at [facility].  . Hyperlipidemia   . Hypertension   . Myocardial infarction [facility]) 2008   [name] 03/23/2015  . PAD (peripheral artery disease) ([place])   . Panic attack   . Tobacco abuse    Past Surgical History:  Procedure Laterality Date  . CARDIAC CATHETERIZATION N/A 03/25/2015   Procedure: Left Heart Cath and Coronary Angiography;  Surgeon: Burnell Blanks, MD;  Location: [facility];  Service: Cardiovascular;  Laterality: N/A;  . CARDIAC CATHETERIZATION N/A 03/25/2015   Procedure: Coronary Stent Intervention;  Surgeon: Burnell Blanks, MD;  Location: [place] CV [facility];  Service: Cardiovascular;  Laterality: N/A;  . CORONARY ANGIOPLASTY WITH STENT PLACEMENT  2008   "1"  . FEMORAL-TIBIAL BYPASS GRAFT Right 01/08/2019  Procedure: RIGHT FEMORAL Endartarectomy and profundoplasty using greater saphenous vein graft. Right anterior tibial artery bypass with nonreversed saphenous vein graft;  Surgeon: Angelia Mould, MD;  Location: [facility] OR;  Service: Vascular;  Laterality: Right;  . LOWER EXTREMITY ANGIOGRAM Right 01/08/2019   Procedure: Lower Extremity Angiogram;  Surgeon: Angelia Mould, MD;  Location: [place];  Service: Vascular;  Laterality: Right;  . LOWER EXTREMITY ANGIOGRAPHY N/A 01/06/2019   Procedure: LOWER  EXTREMITY ANGIOGRAPHY;  Surgeon: Serafina Mitchell, MD;  Location: [place] CV LAB;  Service: Cardiovascular;  Laterality: N/A;  . TONSILLECTOMY     Family History  Problem Relation Age of Onset  . CVA Other        Grandparents had strokes  . Cancer Neg Hx    Social History   Socioeconomic History  . Marital status: Married    Spouse name: Not on file  . Number of children: Not on file  . Years of education: Not on file  . Highest education level: Not on file  Occupational History  . Occupation: Retired     Comment: auto-mechanic   Tobacco Use  . Smoking status: Former Smoker    Packs/day: 0.25    Years: 47.00    Pack years: 11.75    Types: Cigarettes    Quit date: 05/19/2019    Years since quitting: 0.3  . Smokeless tobacco: Never Used  Substance and Sexual Activity  . Alcohol use: Not Currently    Alcohol/week: 56.0 standard drinks    Types: 56 Cans of beer per week    Comment: quit 1 year ago   . Drug use: No  . Sexual activity: Not Currently  Other Topics Concern  . Not on file  Social History Narrative  . Not on file   Social Determinants of Health   Financial Resource Strain:   . Difficulty of Paying Living Expenses: Not on file  Food Insecurity:   . Worried About Charity fundraiser in the Last Year: Not on file  . Ran Out of Food in the Last Year: Not on file  Transportation Needs:   . Lack of Transportation (Medical): Not on file  . Lack of Transportation (Non-Medical): Not on file  Physical Activity:   . Days of Exercise per Week: Not on file  . Minutes of Exercise per Session: Not on file  Stress:   . Feeling of Stress : Not on file  Social Connections:   . Frequency of Communication with Friends and Family: Not on file  . Frequency of Social Gatherings with Friends and Family: Not on file  . Attends Religious Services: Not on file  . Active Member of Clubs or Organizations: Not on file  . Attends Archivist Meetings: Not on file  .  Marital Status: Not on file   Tobacco Counseling Counseling given: Not Answered   Clinical Intake:  Pre-visit preparation completed: Yes  Pain : No/denies pain  Diabetes: No  How often do you need to have someone help you when you read instructions, pamphlets, or other written materials from your doctor or pharmacy?: 1 - Never  Interpreter Needed?: No  Information entered by :: Denman George LPN  Activities of Daily Living In your present state of health, do you have any difficulty performing the following activities: 09/24/2019 01/11/2019  Hearing? Y N  Comment at times -  Vision? N N  Difficulty concentrating or making decisions? N N  Walking or climbing stairs? Aggie Moats  Dressing or bathing? N N  Doing errands, shopping? N N  Preparing Food and eating ? N -  Using the Toilet? N -  In the past six months, have you accidently leaked urine? N -  Do you have problems with loss of bowel control? N -  Managing your Medications? N -  Managing your Finances? N -  Housekeeping or managing your Housekeeping? N -  Some recent data might be hidden     Immunizations and Health Maintenance Immunization History  Administered Date(s) Administered  . Fluad Quad(high Dose 65+) 05/24/2019  . Influenza, High Dose Seasonal PF 08/24/2018   Health Maintenance Due  Topic Date Due  . Hepatitis C Screening  1951/05/24  . COLONOSCOPY  03/04/2002    Patient Care Team: Vivi Barrack, MD as PCP - General (Family Medicine) Nickel, Sharmon Leyden, NP as Nurse Practitioner (Vascular Surgery)  Indicate any recent Medical Services you may have received from other than [facility] providers in the past year (date may be approximate).    Assessment:   This is a routine wellness examination for Ch[place].  Hearing/Vision screen No exam data present  Dietary issues and exercise activities discussed: Current Exercise Habits: The patient does not participate in regular exercise at present  Goals   None     Depression Screen PHQ 2/9 Scores 09/24/2019 04/04/2015  PHQ - 2 Score 0 0    Fall Risk Fall Risk  09/24/2019 04/04/2015  Falls in the past year? 0 No  Injury with Fall? 0 -  Follow up Falls evaluation completed;Education provided;Falls prevention discussed -    Is the patient's home free of loose throw rugs in walkways, pet beds, electrical cords, etc?   yes      Grab bars in the bathroom? yes      Handrails on the stairs?   yes      Adequate lighting?   yes  Cognitive Function: no cognitive concerns at this time  Cognitive Testing  Alert? Yes         Normal Appearance? N/a  Oriented to person? Yes           Place? Yes  Time? Yes  Recall of three objects? Yes  Can perform simple calculations? Yes  Displays appropriate judgment? Yes  Can read the correct time from a watch face? Yes  Screening Tests Health Maintenance  Topic Date Due  . Hepatitis C Screening  05/24/1951  . COLONOSCOPY  03/04/2002  . PNA vac Low Risk Adult (1 of 2 - PCV13) 06/08/2020 (Originally 03/04/2017)  . TETANUS/TDAP  07/05/2020 (Originally 03/05/1971)  . INFLUENZA VACCINE  Completed    Qualifies for Shingles Vaccine? Discussed and patient will check with pharmacy for coverage.  Patient education handout provided   Cancer Screenings: Lung: Low Dose CT Chest recommended if Age 76-80 years, 30 pack-year currently smoking OR have quit w/in 15years. Patient does not qualify. Colorectal: Cologuard recommended; patient education handouts provided      Plan:  I have personally reviewed and addressed the Medicare Annual Wellness questionnaire and have noted the following in the patient's chart:  A. Medical and social history B. Use of alcohol, tobacco or illicit drugs  C. Current medications and supplements D. Functional ability and status E.  Nutritional status F.  Physical activity G. Advance directives H. List of other physicians I.  Hospitalizations, surgeries, and ER visits in previous 12 months J.   [place] such as hearing and vision if needed, cognitive and  depression L. Referrals, records requested, and appointments- none   In addition, I have reviewed and discussed with patient certain preventive protocols, quality metrics, and best practice recommendations. A written personalized care plan for preventive services as well as general preventive health recommendations were provided to patient.   Signed,  Denman George, LPN  Nurse Health Advisor   Nurse Notes: no additional

## 2019-09-24 NOTE — Telephone Encounter (Signed)
I left a message for the patient explaining that Omar Singh will only be doing virtual visits today.  I explained that she can call him or he can call us to reschedule if he prefers to come into the office.

## 2019-09-24 NOTE — Patient Instructions (Signed)
Omar Singh , Thank you for taking time to come for your Medicare Wellness Visit. I appreciate your ongoing commitment to your health goals. Please review the following plan we discussed and let me know if I can assist you in the future.   Screening recommendations/referrals: Colorectal Screening: recommended   Vision and Dental Exams: Recommended annual ophthalmology exams for early detection of glaucoma and other disorders of the eye Recommended annual dental exams for proper oral hygiene  Vaccinations: Influenza vaccine: completed 05/24/19 Pneumococcal vaccine: recommended  Tdap vaccine: recommended; Please call your insurance company to determine your out of pocket expense. You may receive this vaccine at your local pharmacy or Health Dept. Shingles vaccine: Please call your insurance company to determine your out of pocket expense for the Shingrix vaccine. You may receive this vaccine at your local pharmacy.  Advanced directives:  I have provided a copy for you to complete at home and have notarized. Once this is complete please bring a copy in to our office so we can scan it into your chart.  Goals: Recommend to drink at least 6-8 8oz glasses of water per day and consume a balanced diet rich in fresh fruits and vegetables.   Next appointment: Please schedule your Annual Wellness Visit with your Nurse Health Advisor in one year.  Preventive Care 29 Years and Older, Male Preventive care refers to lifestyle choices and visits with your health care provider that can promote health and wellness. What does preventive care include?  A yearly physical exam. This is also called an annual well check.  Dental exams once or twice a year.  Routine eye exams. Ask your health care provider how often you should have your eyes checked.  Personal lifestyle choices, including:  Daily care of your teeth and gums.  Regular physical activity.  Eating a healthy diet.  Avoiding tobacco and drug  use.  Limiting alcohol use.  Practicing safe sex.  Taking low doses of aspirin every day if recommended by your health care provider..  Taking vitamin and mineral supplements as recommended by your health care provider. What happens during an annual well check? The services and screenings done by your health care provider during your annual well check will depend on your age, overall health, lifestyle risk factors, and family history of disease. Counseling  Your health care provider may ask you questions about your:  Alcohol use.  Tobacco use.  Drug use.  Emotional well-being.  Home and relationship well-being.  Sexual activity.  Eating habits.  History of falls.  Memory and ability to understand (cognition).  Work and work Statistician. Screening  You may have the following tests or measurements:  Height, weight, and BMI.  Blood pressure.  Lipid and cholesterol levels. These may be checked every 5 years, or more frequently if you are over 14 years old.  Skin check.  Lung cancer screening. You may have this screening every year starting at age 17 if you have a 30-pack-year history of smoking and currently smoke or have quit within the past 15 years.  Fecal occult blood test (FOBT) of the stool. You may have this test every year starting at age 37.  Flexible sigmoidoscopy or colonoscopy. You may have a sigmoidoscopy every 5 years or a colonoscopy every 10 years starting at age 39.  Prostate cancer screening. Recommendations will vary depending on your family history and other risks.  Hepatitis C blood test.  Hepatitis B blood test.  Sexually transmitted disease (STD) testing.  Diabetes  screening. This is done by checking your blood sugar (glucose) after you have not eaten for a while (fasting). You may have this done every 1-3 years.  Abdominal aortic aneurysm (AAA) screening. You may need this if you are a current or former smoker.  Osteoporosis. You may  be screened starting at age 29 if you are at high risk. Talk with your health care provider about your test results, treatment options, and if necessary, the need for more tests. Vaccines  Your health care provider may recommend certain vaccines, such as:  Influenza vaccine. This is recommended every year.  Tetanus, diphtheria, and acellular pertussis (Tdap, Td) vaccine. You may need a Td booster every 10 years.  Zoster vaccine. You may need this after age 33.  Pneumococcal 13-valent conjugate (PCV13) vaccine. One dose is recommended after age 65.  Pneumococcal polysaccharide (PPSV23) vaccine. One dose is recommended after age 60. Talk to your health care provider about which screenings and vaccines you need and how often you need them. This information is not intended to replace advice given to you by your health care provider. Make sure you discuss any questions you have with your health care provider. Document Released: 09/30/2015 Document Revised: 05/23/2016 Document Reviewed: 07/05/2015 Elsevier Interactive Patient Education  2017 [place] Prevention in the Home Falls can cause injuries. They can happen to people of all ages. There are many things you can do to make your home safe and to help prevent falls. What can I do on the outside of my home?  Regularly fix the edges of walkways and driveways and fix any cracks.  Remove anything that might make you trip as you walk through a door, such as a raised step or threshold.  Trim any bushes or trees on the path to your home.  Use bright outdoor lighting.  Clear any walking paths of anything that might make someone trip, such as rocks or tools.  Regularly check to see if handrails are loose or broken. Make sure that both sides of any steps have handrails.  Any raised decks and porches should have guardrails on the edges.  Have any leaves, snow, or ice cleared regularly.  Use sand or salt on walking paths during  winter.  Clean up any spills in your garage right away. This includes oil or grease spills. What can I do in the bathroom?  Use night lights.  Install grab bars by the toilet and in the tub and shower. Do not use towel bars as grab bars.  Use non-skid mats or decals in the tub or shower.  If you need to sit down in the shower, use a plastic, non-slip stool.  Keep the floor dry. Clean up any water that spills on the floor as soon as it happens.  Remove soap buildup in the tub or shower regularly.  Attach bath mats securely with double-sided non-slip rug tape.  Do not have throw rugs and other things on the floor that can make you trip. What can I do in the bedroom?  Use night lights.  Make sure that you have a light by your bed that is easy to reach.  Do not use any sheets or blankets that are too big for your bed. They should not hang down onto the floor.  Have a firm chair that has side arms. You can use this for support while you get dressed.  Do not have throw rugs and other things on the floor that can make  you trip. What can I do in the kitchen?  Clean up any spills right away.  Avoid walking on wet floors.  Keep items that you use a lot in easy-to-reach places.  If you need to reach something above you, use a strong step stool that has a grab bar.  Keep electrical cords out of the way.  Do not use floor polish or wax that makes floors slippery. If you must use wax, use non-skid floor wax.  Do not have throw rugs and other things on the floor that can make you trip. What can I do with my stairs?  Do not leave any items on the stairs.  Make sure that there are handrails on both sides of the stairs and use them. Fix handrails that are broken or loose. Make sure that handrails are as long as the stairways.  Check any carpeting to make sure that it is firmly attached to the stairs. Fix any carpet that is loose or worn.  Avoid having throw rugs at the top or  bottom of the stairs. If you do have throw rugs, attach them to the floor with carpet tape.  Make sure that you have a light switch at the top of the stairs and the bottom of the stairs. If you do not have them, ask someone to add them for you. What else can I do to help prevent falls?  Wear shoes that:  Do not have high heels.  Have rubber bottoms.  Are comfortable and fit you well.  Are closed at the toe. Do not wear sandals.  If you use a stepladder:  Make sure that it is fully opened. Do not climb a closed stepladder.  Make sure that both sides of the stepladder are locked into place.  Ask someone to hold it for you, if possible.  Clearly mark and make sure that you can see:  Any grab bars or handrails.  First and last steps.  Where the edge of each step is.  Use tools that help you move around (mobility aids) if they are needed. These include:  Canes.  Walkers.  Scooters.  Crutches.  Turn on the lights when you go into a dark area. Replace any light bulbs as soon as they burn out.  Set up your furniture so you have a clear path. Avoid moving your furniture around.  If any of your floors are uneven, fix them.  If there are any pets around you, be aware of where they are.  Review your medicines with your doctor. Some medicines can make you feel dizzy. This can increase your chance of falling. Ask your doctor what other things that you can do to help prevent falls. This information is not intended to replace advice given to you by your health care provider. Make sure you discuss any questions you have with your health care provider. Document Released: 06/30/2009 Document Revised: 02/09/2016 Document Reviewed: 10/08/2014 Elsevier Interactive Patient Education  2017 [employer].

## 2020-02-01 ENCOUNTER — Ambulatory Visit (INDEPENDENT_AMBULATORY_CARE_PROVIDER_SITE_OTHER)
Admission: RE | Admit: 2020-02-01 | Discharge: 2020-02-01 | Disposition: A | Discharge status: Home / Self Care | Payer: Medicare PPO | Source: Ambulatory Visit | Attending: Surgery | Admitting: Surgery

## 2020-02-01 ENCOUNTER — Other Ambulatory Visit: Payer: Self-pay

## 2020-02-01 ENCOUNTER — Ambulatory Visit (HOSPITAL_COMMUNITY)
Admission: RE | Admit: 2020-02-01 | Discharge: 2020-02-01 | Disposition: A | Discharge status: Home / Self Care | Payer: Medicare PPO | Source: Ambulatory Visit | Attending: Surgery | Admitting: Surgery

## 2020-02-01 ENCOUNTER — Ambulatory Visit (INDEPENDENT_AMBULATORY_CARE_PROVIDER_SITE_OTHER): Payer: Medicare PPO | Admitting: Physician Assistant

## 2020-02-01 VITALS — BP 184/93 | HR 60 | Temp 97.7°F | Resp 18 | Ht 72.0 in | Wt 210.0 lb

## 2020-02-01 DIAGNOSIS — I779 Disorder of arteries and arterioles, unspecified: Secondary | ICD-10-CM

## 2020-02-01 NOTE — Progress Notes (Signed)
HISTORY AND PHYSICAL     CC:  follow up. Requesting Provider:  Vivi Barrack, MD  HPI: This is a 69 y.o. male who is here today for follow up.  He is pt of Dr. Scot Dock who presented with rest pain of the right foot and had multilevel arterial occlusive disease.  Given his critical limb ischemia he was taken to the operating room on 01/08/2019 and underwent extensive endarterectomy of the right external iliac artery, common femoral artery, and deep femoral artery with profundoplasty using a vein patch.  He also had right femoral to anterior tibial artery bypass with a non-reversed vein graft.    The pt returns today for follow up.  He states that he has been doing very well.  He denies any claudication or non healing wounds.  He does not have any rest pain.   He states he still has some swelling in his right leg.  He does wear compression socks and elevates his legs and he states this helps with the swelling.    The pt is  on a statin for cholesterol management.    The pt is on an aspirin.    Other AC:  none The pt is on BB, ARB, CCB for hypertension.  The pt does not have diabetes. Tobacco hx:  Former-quit 05/2019   Past Medical History:  Diagnosis Date  . Acute exacerbation of chronic obstructive pulmonary disease (COPD) ([place])    [name] 03/23/2015  . Alcohol abuse   . CAD (coronary artery disease)    a. s/p multi-link vision stent to the mid RCA in 2007 at [facility]  . Hyperlipidemia   . Hypertension   . Myocardial infarction [facility]) 2008   [name] 03/23/2015  . PAD (peripheral artery disease) ([place])   . Panic attack   . Tobacco abuse     Past Surgical History:  Procedure Laterality Date  . CARDIAC CATHETERIZATION N/A 03/25/2015   Procedure: Left Heart Cath and Coronary Angiography;  Surgeon: Burnell Blanks, MD;  Location: [place] [facility];  Service: Cardiovascular;  Laterality: N/A;  . CARDIAC CATHETERIZATION N/A 03/25/2015   Procedure: Coronary Stent Intervention;   Surgeon: Burnell Blanks, MD;  Location: [place] [facility];  Service: Cardiovascular;  Laterality: N/A;  . CORONARY ANGIOPLASTY WITH STENT PLACEMENT  2008   "1"  . FEMORAL-TIBIAL BYPASS GRAFT Right 01/08/2019   Procedure: RIGHT FEMORAL Endartarectomy and profundoplasty using greater saphenous vein graft. Right anterior tibial artery bypass with nonreversed saphenous vein graft;  Surgeon: Angelia Mould, MD;  [facility];  Service: Vascular;  Laterality: Right;  . LOWER EXTREMITY ANGIOGRAM Right 01/08/2019   Procedure: Lower Extremity Angiogram;  Surgeon: Angelia Mould, MD;  Location: [facility];  Service: Vascular;  Laterality: Right;  . LOWER EXTREMITY ANGIOGRAPHY N/A 01/06/2019   Procedure: LOWER EXTREMITY ANGIOGRAPHY;  Surgeon: Serafina Mitchell, MD;  Location: [place] [facility];  Service: Cardiovascular;  Laterality: N/A;  . TONSILLECTOMY      No Known Allergies  Current Outpatient Medications  Medication Sig Dispense Refill  . amLODipine (NORVASC) 10 MG tablet Take 1 tablet (10 mg total) by mouth daily. 90 tablet 3  . aspirin EC 81 MG EC tablet Take 1 tablet (81 mg total) by mouth daily. 30 tablet 0  . atorvastatin (LIPITOR) 40 MG tablet Take 1 tablet (40 mg total) by mouth daily at 6 PM. 30 tablet 0  . diphenhydramine-acetaminophen (TYLENOL PM) 25-500 MG TABS tablet Take 2 tablets  by mouth at bedtime.    . hydrocortisone 2.5 % cream Apply topically 2 (two) times daily. 30 g 0  . metoprolol tartrate (LOPRESSOR) 25 MG tablet Take 1 tablet (25 mg total) by mouth 2 (two) times daily. 180 tablet 3  . olmesartan (BENICAR) 40 MG tablet Take 1 tablet (40 mg total) by mouth daily. 90 tablet 0   No current facility-administered medications for this visit.    Family History  Problem Relation Age of Onset  . CVA Other        Grandparents had strokes  . Cancer Neg Hx     Social History   Socioeconomic History  . Marital status: Married    Spouse name: Not on file   . Number of children: Not on file  . Years of education: Not on file  . Highest education level: Not on file  Occupational History  . Occupation: Retired     Comment: auto-mechanic   Tobacco Use  . Smoking status: Former Smoker    Packs/day: 0.25    Years: 47.00    Pack years: 11.75    Types: Cigarettes    Quit date: 05/19/2019    Years since quitting: 0.7  . Smokeless tobacco: Never Used  Substance and Sexual Activity  . Alcohol use: Not Currently    Alcohol/week: 56.0 standard drinks    Types: 56 Cans of beer per week    Comment: quit 1 year ago   . Drug use: No  . Sexual activity: Not Currently  Other Topics Concern  . Not on file  Social History Narrative  . Not on file   Social Determinants of Health   Financial Resource Strain:   . Difficulty of Paying Living Expenses:   Food Insecurity:   . Worried About Charity fundraiser in the Last Year:   . Arboriculturist in the Last Year:   Transportation Needs:   . Film/video editor (Medical):   Marland Kitchen Lack of Transportation (Non-Medical):   Physical Activity:   . Days of Exercise per Week:   . Minutes of Exercise per Session:   Stress:   . Feeling of Stress :   Social Connections:   . Frequency of Communication with Friends and Family:   . Frequency of Social Gatherings with Friends and Family:   . Attends Religious Services:   . Active Member of Clubs or Organizations:   . Attends Archivist Meetings:   Marland Kitchen Marital Status:   Intimate Partner Violence:   . Fear of Current or Ex-Partner:   . Emotionally Abused:   Marland Kitchen Physically Abused:   . Sexually Abused:      REVIEW OF SYSTEMS:  No changes [X]  denotes positive finding, [ ]  denotes negative finding Cardiac  Comments:  Chest pain or chest pressure:    Shortness of breath upon exertion:    Short of breath when lying flat:    Irregular heart rhythm:        Vascular    Pain in calf, thigh, or hip brought on by ambulation:    Pain in feet at night  that wakes you up from your sleep:     Blood clot in your veins:    Leg swelling:         Pulmonary    Oxygen at home:    Productive cough:     Wheezing:         Neurologic    Sudden weakness in arms or  legs:     Sudden numbness in arms or legs:     Sudden onset of difficulty speaking or slurred speech:    Temporary loss of vision in one eye:     Problems with dizziness:         Gastrointestinal    Blood in stool:     Vomited blood:         Genitourinary    Burning when urinating:     Blood in urine:        Psychiatric    Major depression:         Hematologic    Bleeding problems:    Problems with blood clotting too easily:        Skin    Rashes or ulcers:        Constitutional    Fever or chills:      PHYSICAL EXAMINATION:  Today's Vitals   02/01/20 1315  BP: (!) 184/93  Pulse: 60  Resp: 18  Temp: 97.7 F (36.5 C)  TempSrc: Temporal  SpO2: 95%  Weight: 210 lb (95.3 kg)  Height: 6' (1.829 m)   Body mass index is 28.48 kg/m.   General:  WDWN in NAD; vital signs documented above Gait: Not observed HENT: WNL, normocephalic Pulmonary: normal non-labored breathing , without Rales, rhonchi,  wheezing Cardiac: regular HR, without  Murmurs; without carotid bruits Abdomen: soft, NT, no masses Skin: without rashes Vascular Exam/Pulses:  Right Left  Femoral 2+ (normal) 2+ (normal)  Popliteal Unable to palpate  Unable to palpate   DP 2+ (normal) 2+ (normal)  PT Unable to palpate  Unable to palpate    Extremities: without ischemic changes, without Gangrene , without cellulitis; without open wounds; +swelling RLE; easily palpable graft pulse latrerally Musculoskeletal: no muscle wasting or atrophy  Neurologic: A&O X 3;  No focal weakness or paresthesias are detected Psychiatric:  The pt has Normal affect.   Non-Invasive Vascular Imaging:   ABI's/TBI's on 02/01/2020: Right:  1.00/0.78 - Great toe pressure: 158 Left:  0.69/0.47 - Great toe pressure:  95  Arterial duplex on 02/01/2020: Right Graft #1:  +------------------+--------+---------------+---------+--------+           PSV cm/sStenosis    Waveform Comments  +------------------+--------+---------------+---------+--------+  Inflow      150           biphasic       +------------------+--------+---------------+---------+--------+  Prox Anastomosis 51           biphasic       +------------------+--------+---------------+---------+--------+  Proximal Graft  37           biphasic dampened  +------------------+--------+---------------+---------+--------+  Mid Graft     81           biphasic       +------------------+--------+---------------+---------+--------+  Distal Graft   263   50-70% stenosisbiphasic       +------------------+--------+---------------+---------+--------+  Distal Anastomosis299   50-70% stenosistriphasic      +------------------+--------+---------------+---------+--------+  Outflow      140           biphasic       +------------------+--------+---------------+---------+--------+   Right EIA dst 194 cm/s biphasic waveform   Summary:  Right: Patent right femoral to anterior bypass graft with increased  velocity (50 - 74%) at the distal graft and distal anastamosis.   NOTE: Significant homogenous plaque at the inflow artery and profunda  artery.   Previous ABI's/TBI's on 07/26/2020: Right:  1.08/0.84 - Great toe pressure: 133 Left:  0.78/0.56 - Great toe pressure:  88   ASSESSMENT/PLAN:: 69 y.o. male here for follow up for extensive endarterectomy of the right external iliac artery, common femoral artery, and deep femoral artery with profundoplasty using a vein patchand right femoral to anterior tibial artery bypass with a non-reversed vein graft on 01/08/2019 by Dr. Scot Dock  -pt doing well and bypass is  patent and has palpable graft pulse laterally.  He is not having any claudication or non healing wounds and has palpable DP pulses bilaterally.   There is a 50-70% stenosis in the distal graft.  Pt has palpable DP pulses bilaterally.   Will have him f/u in 6 months with arterial duplex and ABI's.   -instructed pt to call us sooner should he develop any rest pain, non healing wounds or worsening claudication.   -his BP is elevated today-advised pt to f/u with PCP -continue asa/statin   Leontine Locket, [facility] [phone]  Clinic MD:   Trula Slade

## 2020-02-02 ENCOUNTER — Other Ambulatory Visit: Payer: Self-pay | Admitting: *Deleted

## 2020-02-02 DIAGNOSIS — I70229 Atherosclerosis of native arteries of extremities with rest pain, unspecified extremity: Secondary | ICD-10-CM

## 2020-02-02 DIAGNOSIS — I779 Disorder of arteries and arterioles, unspecified: Secondary | ICD-10-CM

## 2020-08-29 ENCOUNTER — Other Ambulatory Visit (HOSPITAL_COMMUNITY): Payer: Medicare PPO

## 2020-08-29 ENCOUNTER — Ambulatory Visit: Payer: Medicare PPO

## 2020-08-29 ENCOUNTER — Encounter (HOSPITAL_COMMUNITY): Payer: Medicare PPO

## 2021-01-19 IMAGING — CR CHEST - 2 VIEW
2 series · 2 of 2 positions shown · non-contrast
Comparison: 03/25/2015.

CLINICAL DATA: Chest pain.  Swollen feet.

EXAM:
CHEST - 2 VIEW

[chest pa]
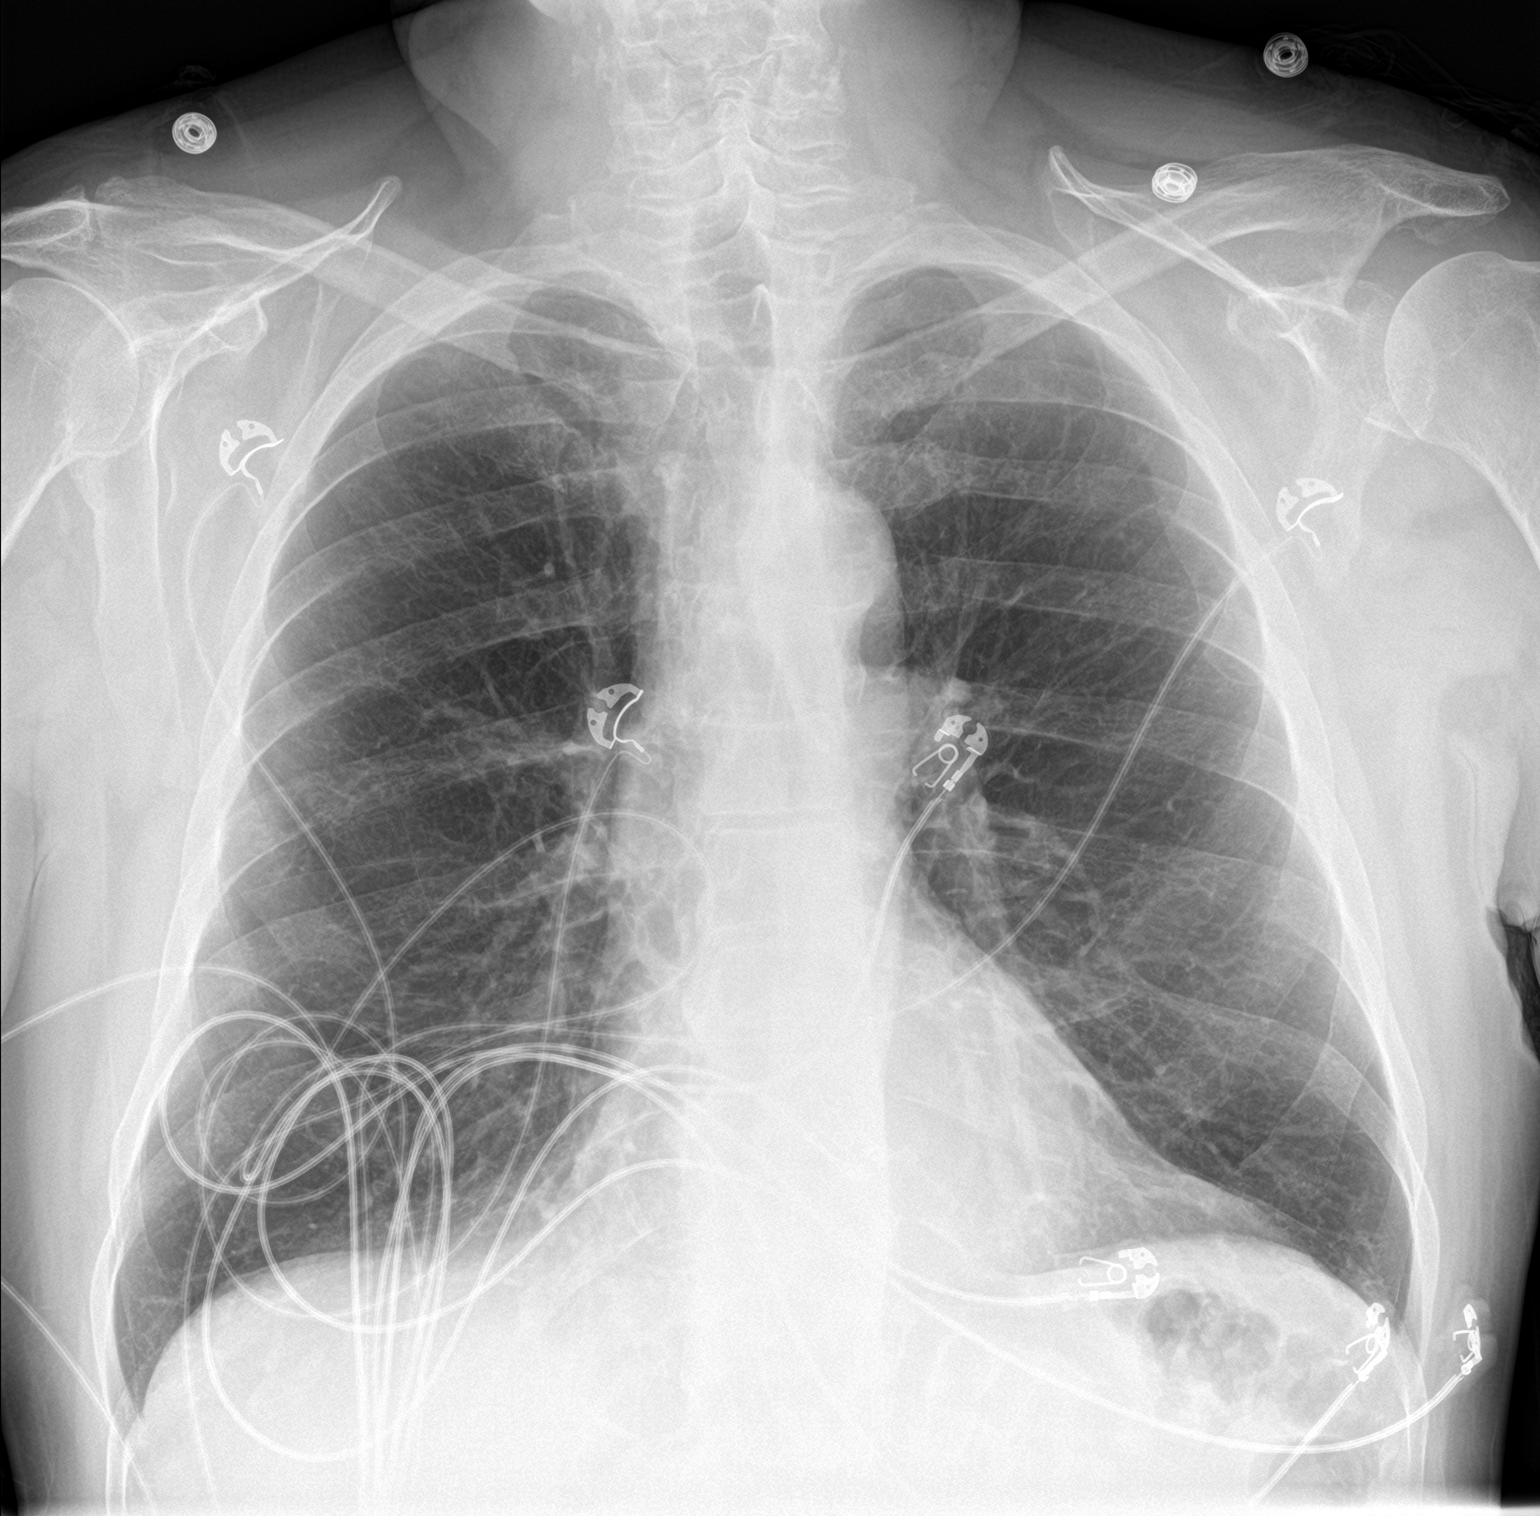

[chest lat]
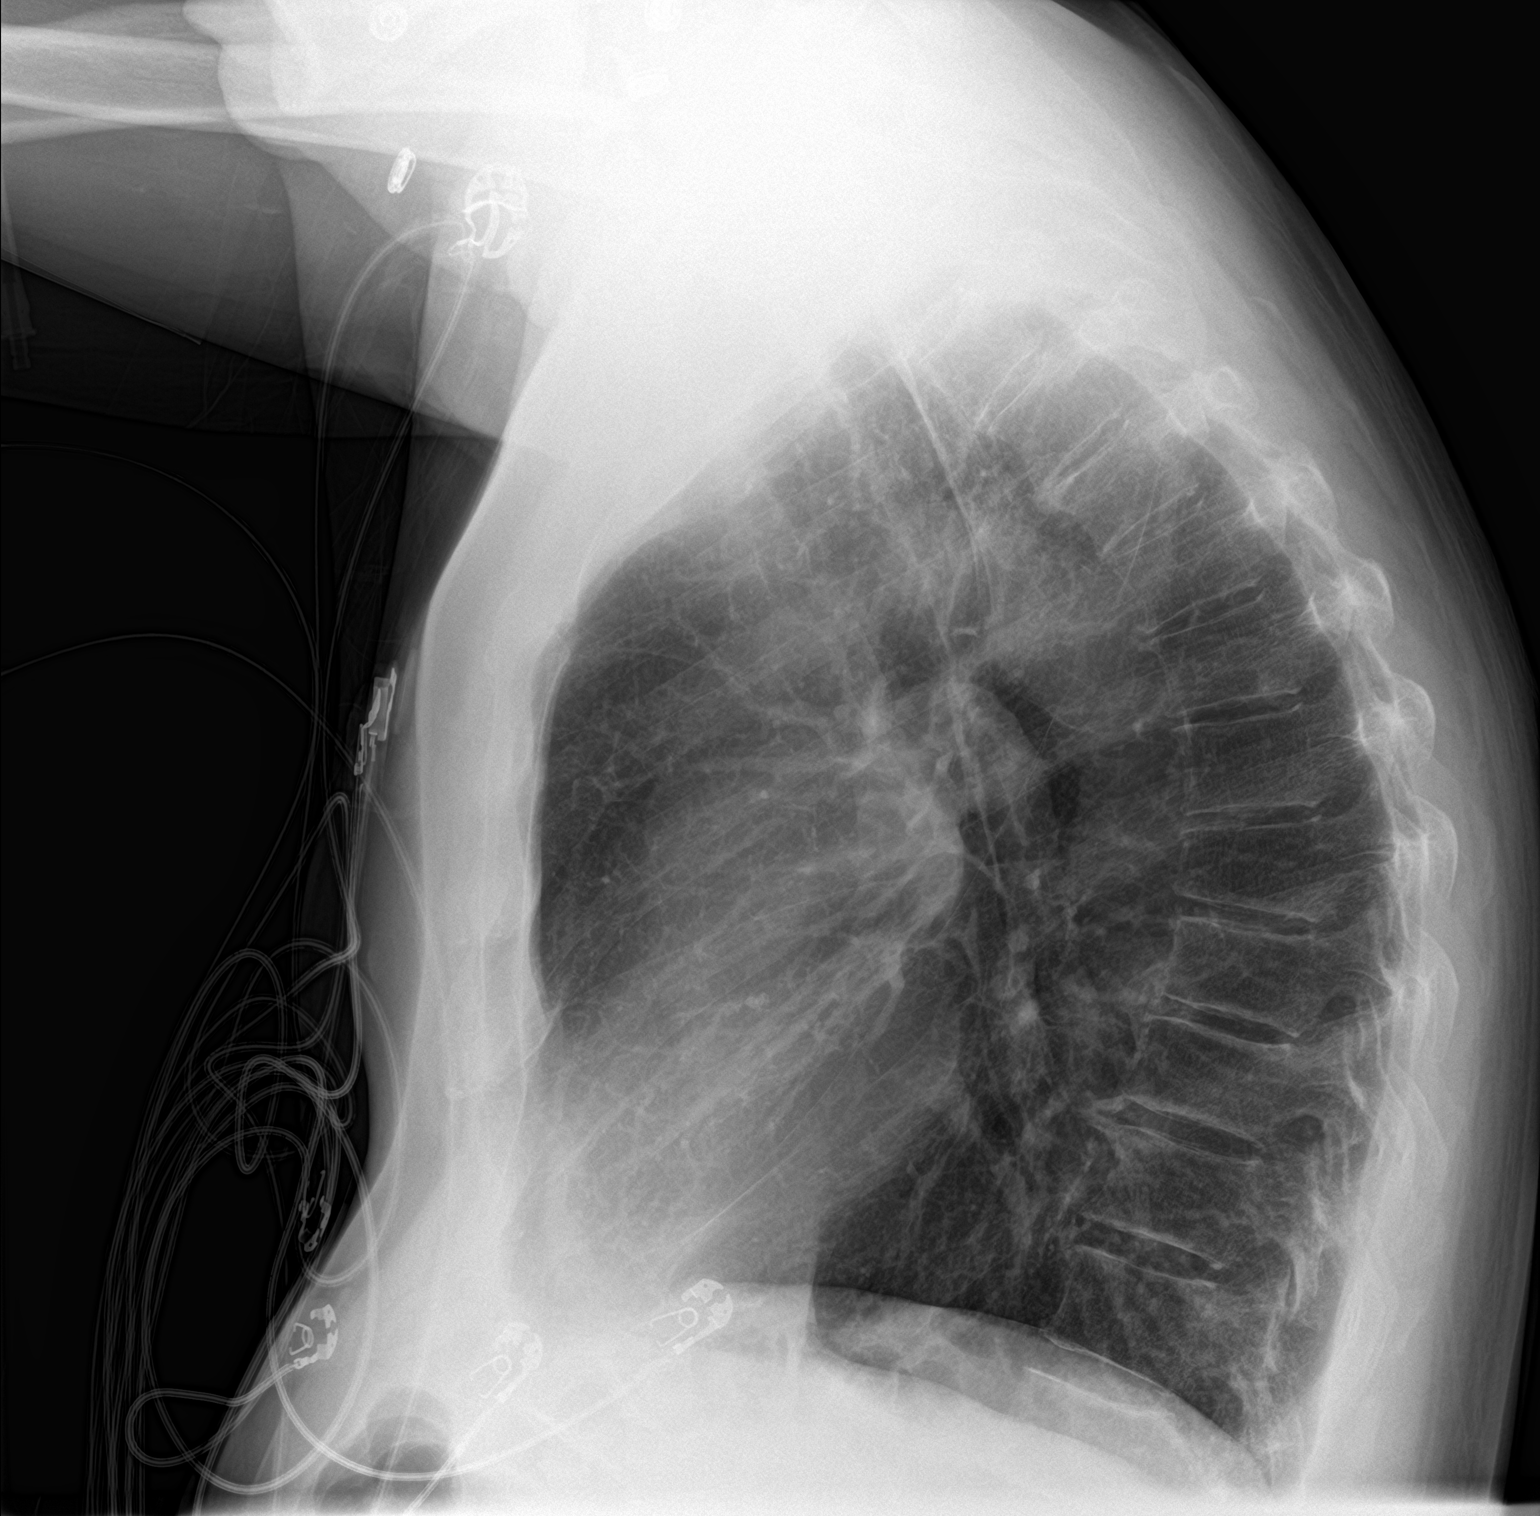

[2 of 2 positions shown; findings below may reference images not displayed]

FINDINGS: Mediastinum hilar structures normal. Lungs are clear. No pleural
effusion pneumothorax. Heart size normal. No acute bony abnormality
identified. Degenerative change thoracic spine.
IMPRESSION: No acute cardiopulmonary disease.  Chest is stable prior exam.

## 2021-02-24 IMAGING — CT CT ANGIOGRAPHY CHEST
2 of 8 series · 18 of 46 positions shown · IV contrast (omnipaque)
Comparison: Chest 01/10/2019

CLINICAL DATA: Fever and cough. Suspect pulmonary embolism. Recent
femoral tibial bypass graft 01/08/2019

EXAM:
CT ANGIOGRAPHY CHEST WITH CONTRAST
TECHNIQUE: Multidetector CT imaging of the chest was performed using the
standard protocol during bolus administration of intravenous
contrast. Multiplanar CT image reconstructions and MIPs were
obtained to evaluate the vascular anatomy.
CONTRAST:  75mL OMNIPAQUE IOHEXOL 300 MG/ML  SOLN

[Series 6: thins · axial · 0.98mm/px · z∈[+953,+1273]mm · 15 of 354 slices shown]
[im 17/354  lung]
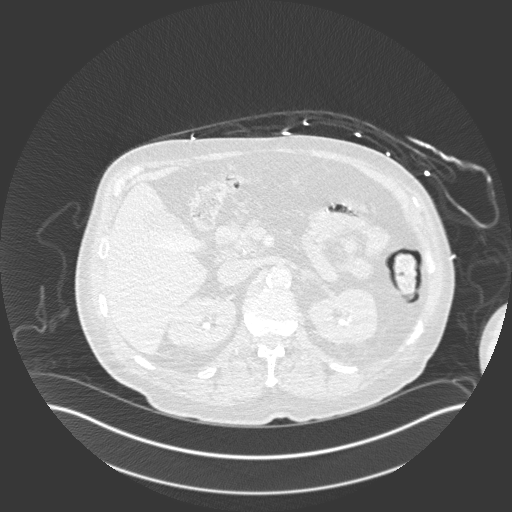
[im 49/354  soft-tissue]
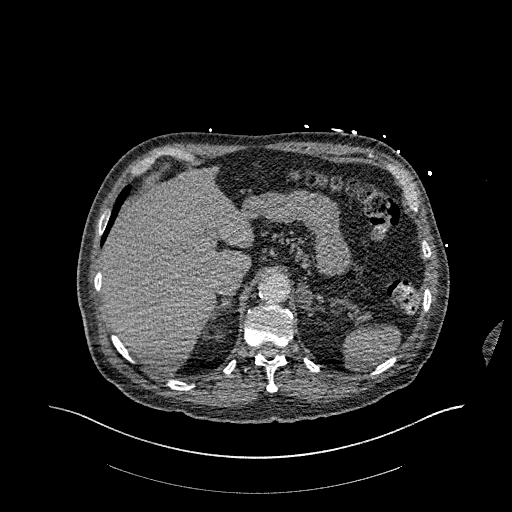
[im 65/354  lung]
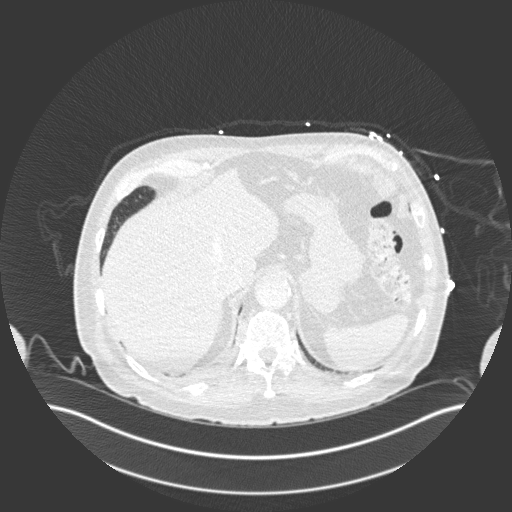
[im 81/354  soft-tissue]
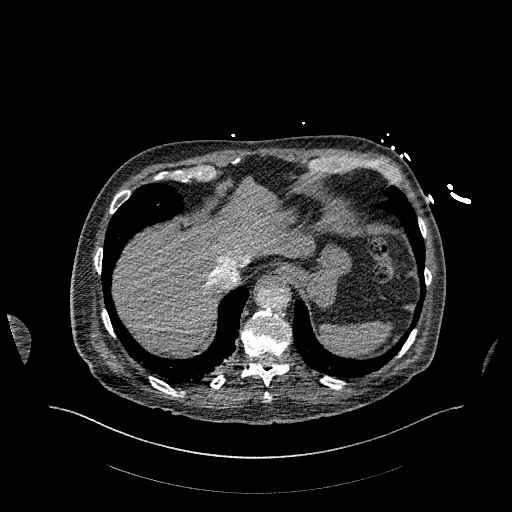
[im 113/354  lung]
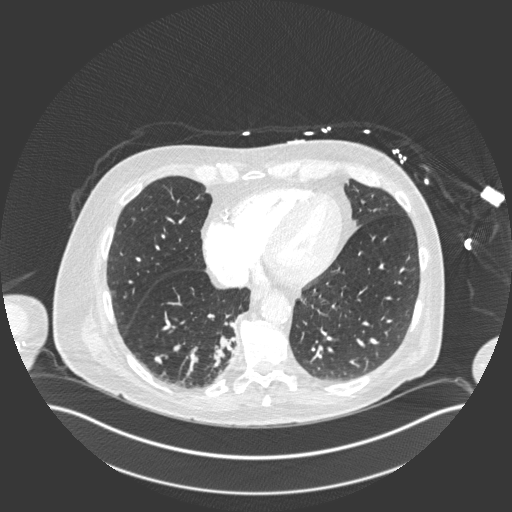
[im 129/354  soft-tissue]
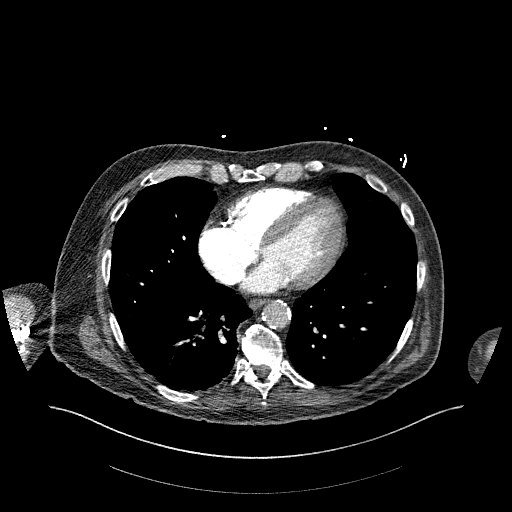
[im 161/354  lung]
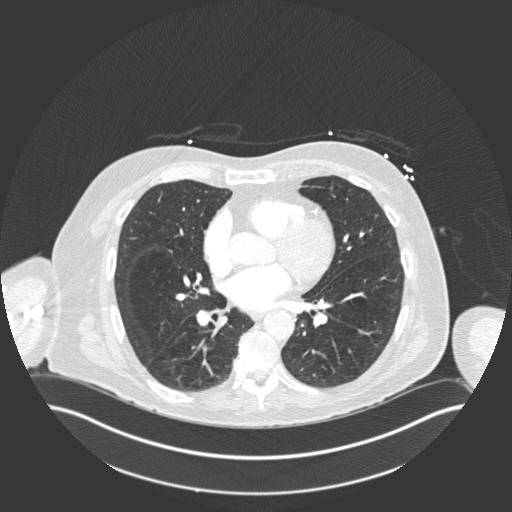
[im 177/354  soft-tissue]
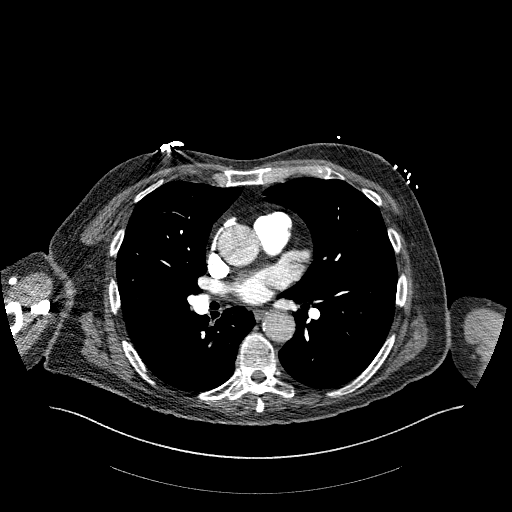
[im 193/354  lung]
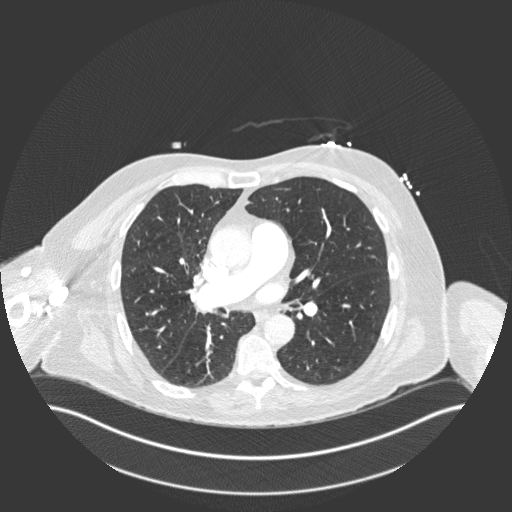
[im 225/354  soft-tissue]
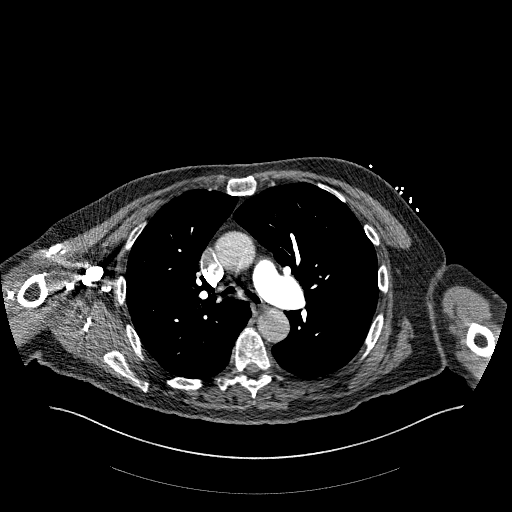
[im 241/354  lung]
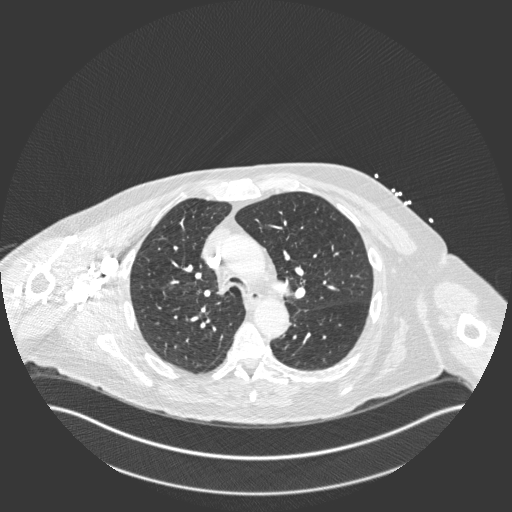
[im 273/354  soft-tissue]
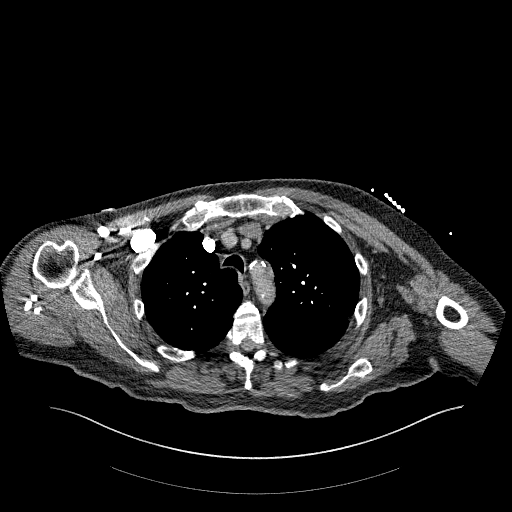
[im 289/354  lung]
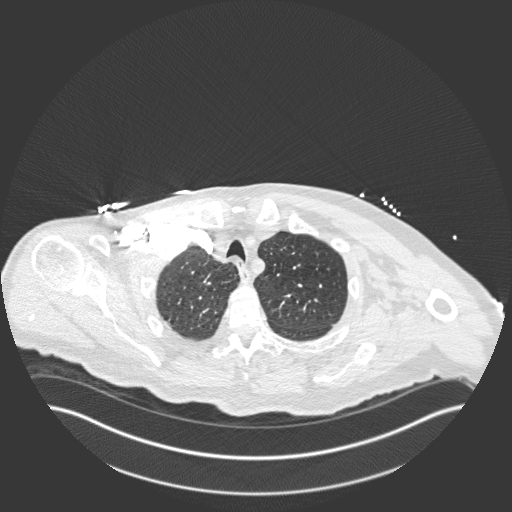
[im 305/354  soft-tissue]
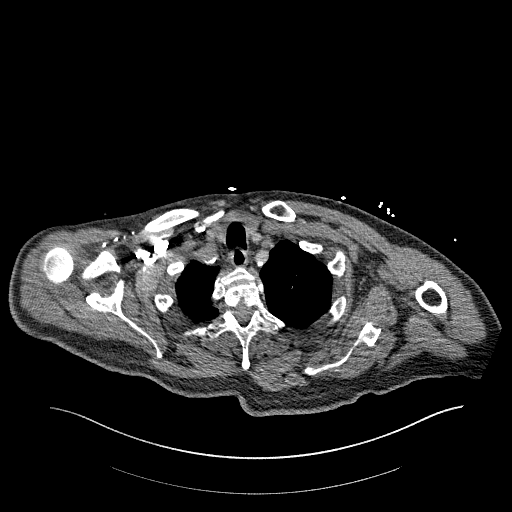
[im 337/354  lung]
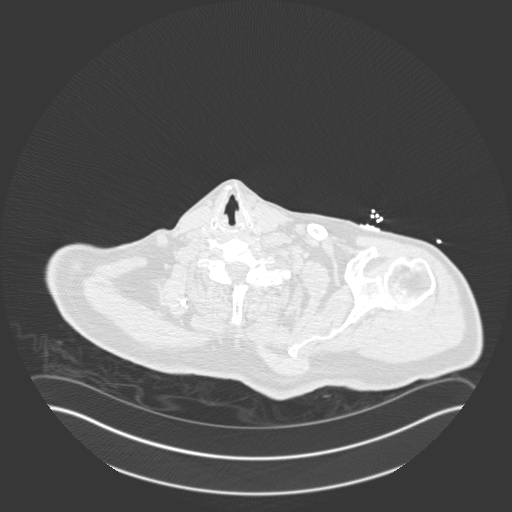

[Series 8: coronal mpr · coronal · 0.69mm/px · 3 of 166 slices shown]
[im 42/166  soft-tissue]
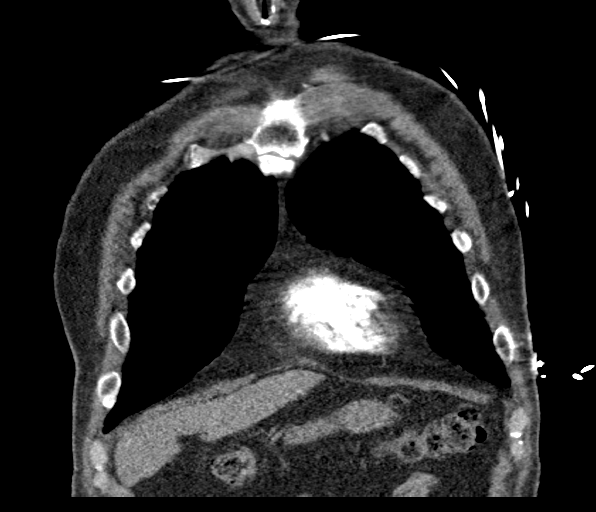
[im 83/166  soft-tissue]
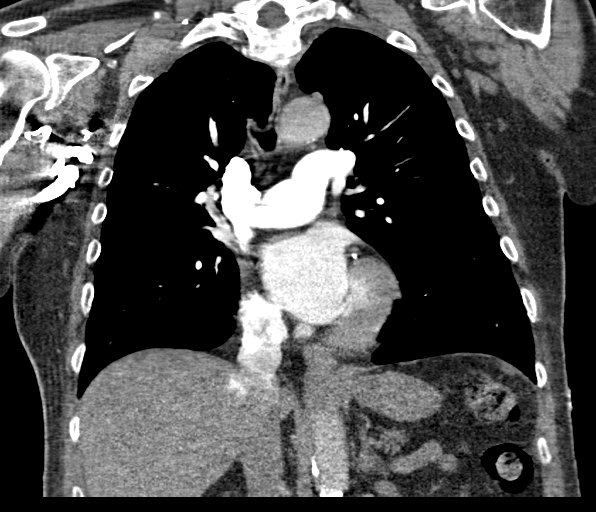
[im 124/166  soft-tissue]
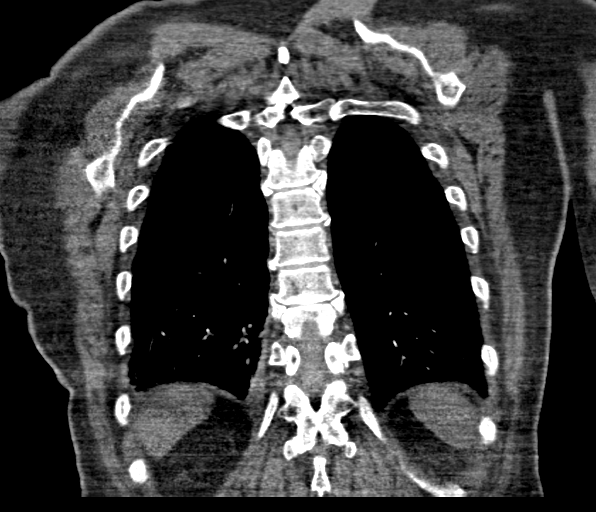

[18 of 46 positions shown; findings below may reference images not displayed]

FINDINGS: Cardiovascular: Negative for pulmonary embolism. Pulmonary arteries
normal in caliber with normal enhancement. Mild atherosclerotic
disease in the aortic arch without aneurysm or dissection. Ascending
aorta 39 mm in diameter. Moderate to extensive coronary artery
calcification. Heart size normal without pericardial effusion

Mediastinum/Nodes: Negative for mass or adenopathy

Lungs/Pleura: Mild dependent atelectasis in the right posterior lung
base. Negative for pneumonia or effusion. No mass or lung nodule
identified.

Upper Abdomen: Layering density in the gallbladder may be vicarious
excretion of contrast from prior angiogram on 01/08/2019. Small
adrenal adenoma on the left 15 mm.

Musculoskeletal: No acute abnormality.

Review of the MIP images confirms the above findings.
IMPRESSION: Negative for pulmonary embolism.  No acute abnormality in the chest.

Atherosclerotic aorta. Moderate to extensive coronary calcification.

## 2022-04-23 DIAGNOSIS — H25043 Posterior subcapsular polar age-related cataract, bilateral: Secondary | ICD-10-CM | POA: Diagnosis not present

## 2022-04-23 DIAGNOSIS — H25013 Cortical age-related cataract, bilateral: Secondary | ICD-10-CM | POA: Diagnosis not present

## 2022-04-23 DIAGNOSIS — H2511 Age-related nuclear cataract, right eye: Secondary | ICD-10-CM | POA: Diagnosis not present

## 2022-04-23 DIAGNOSIS — H2513 Age-related nuclear cataract, bilateral: Secondary | ICD-10-CM | POA: Diagnosis not present

## 2022-04-23 DIAGNOSIS — H02834 Dermatochalasis of left upper eyelid: Secondary | ICD-10-CM | POA: Diagnosis not present

## 2022-05-04 DIAGNOSIS — H2513 Age-related nuclear cataract, bilateral: Secondary | ICD-10-CM | POA: Diagnosis not present

## 2022-05-04 DIAGNOSIS — H18592 Other hereditary corneal dystrophies, left eye: Secondary | ICD-10-CM | POA: Diagnosis not present

## 2022-05-05 DIAGNOSIS — H18592 Other hereditary corneal dystrophies, left eye: Secondary | ICD-10-CM | POA: Diagnosis not present

## 2022-06-25 DIAGNOSIS — H25043 Posterior subcapsular polar age-related cataract, bilateral: Secondary | ICD-10-CM | POA: Diagnosis not present

## 2022-06-25 DIAGNOSIS — H02413 Mechanical ptosis of bilateral eyelids: Secondary | ICD-10-CM | POA: Diagnosis not present

## 2022-06-25 DIAGNOSIS — H2512 Age-related nuclear cataract, left eye: Secondary | ICD-10-CM | POA: Diagnosis not present

## 2022-06-25 DIAGNOSIS — H25013 Cortical age-related cataract, bilateral: Secondary | ICD-10-CM | POA: Diagnosis not present

## 2022-06-25 DIAGNOSIS — H2513 Age-related nuclear cataract, bilateral: Secondary | ICD-10-CM | POA: Diagnosis not present

## 2022-07-09 DIAGNOSIS — D485 Neoplasm of uncertain behavior of skin: Secondary | ICD-10-CM | POA: Diagnosis not present

## 2022-07-09 DIAGNOSIS — L821 Other seborrheic keratosis: Secondary | ICD-10-CM | POA: Diagnosis not present

## 2022-07-09 DIAGNOSIS — L281 Prurigo nodularis: Secondary | ICD-10-CM | POA: Diagnosis not present

## 2022-07-09 DIAGNOSIS — L720 Epidermal cyst: Secondary | ICD-10-CM | POA: Diagnosis not present

## 2022-08-14 DIAGNOSIS — H2513 Age-related nuclear cataract, bilateral: Secondary | ICD-10-CM | POA: Diagnosis not present

## 2022-08-14 DIAGNOSIS — H269 Unspecified cataract: Secondary | ICD-10-CM | POA: Diagnosis not present

## 2022-08-14 DIAGNOSIS — H2512 Age-related nuclear cataract, left eye: Secondary | ICD-10-CM | POA: Diagnosis not present

## 2022-08-14 DIAGNOSIS — H2511 Age-related nuclear cataract, right eye: Secondary | ICD-10-CM | POA: Diagnosis not present

## 2022-08-21 DIAGNOSIS — H2513 Age-related nuclear cataract, bilateral: Secondary | ICD-10-CM | POA: Diagnosis not present

## 2022-08-21 DIAGNOSIS — H2511 Age-related nuclear cataract, right eye: Secondary | ICD-10-CM | POA: Diagnosis not present

## 2022-08-21 DIAGNOSIS — H269 Unspecified cataract: Secondary | ICD-10-CM | POA: Diagnosis not present

## 2022-09-05 ENCOUNTER — Encounter: Payer: Self-pay | Admitting: Family Medicine

## 2022-09-05 ENCOUNTER — Ambulatory Visit (INDEPENDENT_AMBULATORY_CARE_PROVIDER_SITE_OTHER): Payer: Medicare PPO | Admitting: Family Medicine

## 2022-09-05 VITALS — BP 218/80 | HR 54 | Temp 98.0°F | Ht 72.0 in | Wt 217.8 lb

## 2022-09-05 DIAGNOSIS — I2510 Atherosclerotic heart disease of native coronary artery without angina pectoris: Secondary | ICD-10-CM | POA: Diagnosis not present

## 2022-09-05 DIAGNOSIS — Z9861 Coronary angioplasty status: Secondary | ICD-10-CM

## 2022-09-05 DIAGNOSIS — E785 Hyperlipidemia, unspecified: Secondary | ICD-10-CM

## 2022-09-05 DIAGNOSIS — I10 Essential (primary) hypertension: Secondary | ICD-10-CM

## 2022-09-05 DIAGNOSIS — Z23 Encounter for immunization: Secondary | ICD-10-CM

## 2022-09-05 DIAGNOSIS — I480 Paroxysmal atrial fibrillation: Secondary | ICD-10-CM

## 2022-09-05 DIAGNOSIS — I739 Peripheral vascular disease, unspecified: Secondary | ICD-10-CM

## 2022-09-05 DIAGNOSIS — R739 Hyperglycemia, unspecified: Secondary | ICD-10-CM

## 2022-09-05 LAB — CBC
HCT: 46.4 % (ref 39.0–52.0)
Hemoglobin: 15.2 g/dL (ref 13.0–17.0)
MCHC: 32.9 g/dL (ref 30.0–36.0)
MCV: 98.2 fl (ref 78.0–100.0)
Platelets: 226 10*3/uL (ref 150.0–400.0)
RBC: 4.72 Mil/uL (ref 4.22–5.81)
RDW: 14.1 % (ref 11.5–15.5)
WBC: 6.3 10*3/uL (ref 4.0–10.5)

## 2022-09-05 LAB — LIPID PANEL
Cholesterol: 222 mg/dL — ABNORMAL HIGH (ref 0–200)
HDL: 51.3 mg/dL (ref >39.00)
LDL Cholesterol: 147 mg/dL — ABNORMAL HIGH (ref 0–99)
NonHDL: 170.38
Total CHOL/HDL Ratio: 4
Triglycerides: 116 mg/dL (ref 0.0–149.0)
VLDL: 23.2 mg/dL (ref 0.0–40.0)

## 2022-09-05 LAB — COMPREHENSIVE METABOLIC PANEL
ALT: 14 U/L (ref 0–53)
AST: 18 U/L (ref 0–37)
Albumin: 4.4 g/dL (ref 3.5–5.2)
Alkaline Phosphatase: 56 U/L (ref 39–117)
BUN: 14 mg/dL (ref 6–23)
CO2: 29 mEq/L (ref 19–32)
Calcium: 9.7 mg/dL (ref 8.4–10.5)
Chloride: 101 mEq/L (ref 96–112)
Creatinine, Ser: 1.09 mg/dL (ref 0.40–1.50)
GFR: 68.77 mL/min (ref >60.00)
Glucose, Bld: 106 mg/dL — ABNORMAL HIGH (ref 70–99)
Potassium: 5.3 mEq/L — ABNORMAL HIGH (ref 3.5–5.1)
Sodium: 144 mEq/L (ref 135–145)
Total Bilirubin: 0.6 mg/dL (ref 0.2–1.2)
Total Protein: 7 g/dL (ref 6.0–8.3)

## 2022-09-05 LAB — HEMOGLOBIN A1C: Hgb A1c MFr Bld: 6.1 % (ref 4.6–6.5)

## 2022-09-05 LAB — TSH: TSH: 1.17 u[IU]/mL (ref 0.35–5.50)

## 2022-09-05 MED ORDER — AMLODIPINE BESYLATE 10 MG PO TABS: 10.0000 mg | ORAL_TABLET | Freq: Every day | ORAL | 3 refills | Status: DC

## 2022-09-05 NOTE — Assessment & Plan Note (Signed)
Patient does not of her being on statin medication in the past.  He has Lipitor 40 mg daily on his medication list.  He is agreeable to check labs today.  Given his history will likely need to restart Lipitor 40 mg daily.

## 2022-09-05 NOTE — Assessment & Plan Note (Signed)
He is not currently having any symptoms however I did discuss the importance of following up with cardiology routinely as above given his medical history.  He will start back on aspirin 81 mg daily and we are checking lipids today.  Ideally would get him back on his Lipitor if he is agreeable.  We are managing his blood pressure as above as well.  Will likely need to restart a beta-blocker and ACE or ARB at some point in the future.  He has declined to see cardiology at this point.

## 2022-09-05 NOTE — Assessment & Plan Note (Signed)
Patient's blood pressure significantly elevated today to 218/80.  He is currently asymptomatic and has no other signs of endorgan damage.  He has been off all of his antihypertensive medications for over a year.  Patient is agreeable to check labs today and restart medications.  We will start amlodipine 10 mg daily with the goal of lowering his blood pressure over the next several days.  He will monitor at home.  He will come back to see [place] in about a week or so and we can adjust as needed.  Will likely need to add back on his Benicar 40 mg daily and metoprolol tartrate 25 mg twice daily at some point in the future.  I discussed reasons to return to care and seek emergent care  We did discuss importance of routine follow-up.  Patient stated that he has not seen a physician for several years because he did not have any issues and has been doing fine.  Discussed importance of preventative Healthcare maintenance to prevent any new issues arising especially with his past medical history including coronary artery disease with CABG and stents.

## 2022-09-05 NOTE — Patient Instructions (Signed)
It was very nice to see you today!  Your blood pressure is very high.  Please start amlodipine.  Keep an eye on your blood pressure at home.  Please come back in 1 to 2 weeks so that we can recheck.  We will check blood work today.  Please let me know if you change your mind about seeing the cardiologist or vascular doctor.  Take care, Dr Jerline Pain  PLEASE NOTE:  If you had any lab tests, please let us know if you have not heard back within a few days. You may see your results on mychart before we have a chance to review them but we will give you a call once they are reviewed by [place].   If we ordered any referrals today, please let us know if you have not heard from their office within the next week.   If you had any urgent prescriptions sent in today, please check with the pharmacy within an hour of our visit to make sure the prescription was transmitted appropriately.   Please try these tips to maintain a healthy lifestyle:  Eat at least 3 REAL meals and 1-2 snacks per day.  Aim for no more than 5 hours between eating.  If you eat breakfast, please do so within one hour of getting up.   Each meal should contain half fruits/vegetables, one quarter protein, and one quarter carbs (no bigger than a computer mouse)  Cut down on sweet beverages. This includes juice, soda, and sweet tea.   Drink at least 1 glass of water with each meal and aim for at least 8 glasses per day  Exercise at least 150 minutes every week.

## 2022-09-05 NOTE — Assessment & Plan Note (Signed)
Irregular rate and rhythm today.  Patient is agreeable to restart aspirin however does not want to start any other medications at this point.    I recommended for him to follow back up with cardiology for ongoing evaluation management however he declined.

## 2022-09-05 NOTE — Progress Notes (Signed)
Omar Singh is a 71 y.o. male who presents today for an office visit. HE was last seen here over 3 years ago.   Assessment/Plan:  Chronic Problems Addressed Today: Essential hypertension Patient's blood pressure significantly elevated today to 218/80.  He is currently asymptomatic and has no other signs of endorgan damage.  He has been off all of his antihypertensive medications for over a year.  Patient is agreeable to check labs today and restart medications.  We will start amlodipine 10 mg daily with the goal of lowering his blood pressure over the next several days.  He will monitor at home.  He will come back to see [place] in about a week or so and we can adjust as needed.  Will likely need to add back on his Benicar 40 mg daily and metoprolol tartrate 25 mg twice daily at some point in the future.  I discussed reasons to return to care and seek emergent care  We did discuss importance of routine follow-up.  Patient stated that he has not seen a physician for several years because he did not have any issues and has been doing fine.  Discussed importance of preventative Healthcare maintenance to prevent any new issues arising especially with his past medical history including coronary artery disease with CABG and stents.  PAF (paroxysmal atrial fibrillation) Irregular rate and rhythm today.  Patient is agreeable to restart aspirin however does not want to start any other medications at this point.    I recommended for him to follow back up with cardiology for ongoing evaluation management however he declined.  Dyslipidemia Patient does not of her being on statin medication in the past.  He has Lipitor 40 mg daily on his medication list.  He is agreeable to check labs today.  Given his history will likely need to restart Lipitor 40 mg daily.  CAD S/P RCA BMS '08, new RCA BMS 03/25/15 He is not currently having any symptoms however I did discuss the importance of following up with cardiology  routinely as above given his medical history.  He will start back on aspirin 81 mg daily and we are checking lipids today.  Ideally would get him back on his Lipitor if he is agreeable.  We are managing his blood pressure as above as well.  Will likely need to restart a beta-blocker and ACE or ARB at some point in the future.  He has declined to see cardiology at this point.  PVD (peripheral vascular disease) ([place]) Has seen vascular surgery but is not interested in seeing them going forward.  Preventative healthcare Flu shot given today.  Check labs.  Declined colonoscopy.    Subjective:  HPI:  Patient here today with blood pressure concerns.  See A/P for status of chronic conditions.  He was last seen here over 3 years ago.  He has not followed up with me or any of his specialist over the last couple of years.  Patient states that he has not had any recent follow-up and that he has felt fine.  He is here today because he recently saw the ophthalmologist for cataract surgery and was told that his blood pressure was elevated into the 200s.  Patient tells me that he is feeling fine today.  No issues with chest pain.  No headache.  No weakness or numbness.  No nausea or vomiting.  No vision changes with the exception of the cataract above. No confusion. No palpitations.   He does not  wish to restart any of his other nonblood pressure medications.  He is not interested in seeing any other physicians at this time.         Objective:  Physical Exam: BP (!) 218/80   Pulse (!) 54   Temp 98 F (36.7 C) (Temporal)   Ht 6' (1.829 m)   Wt 217 lb 12.8 oz (98.8 kg)   SpO2 96%   BMI 29.54 kg/m   Gen: No acute distress, resting comfortably HEENT: No appreciated papilledema with funduscopic exam. CV: Irregular rhythm.  No murmurs.  No S3.  No rubs or gallops. Pulm: Normal work of breathing, clear to auscultation bilaterally with no crackles, wheezes, or rhonchi Neuro: Grossly normal, moves all  extremities.  cranial nerves III through XII intact.  Strength out of 5 in upper and lower extremities. Psych: Normal affect and thought content      Raife Lizer M. Jerline Pain, MD 09/05/2022 10:48 AM

## 2022-09-05 NOTE — Assessment & Plan Note (Signed)
Has seen vascular surgery but is not interested in seeing them going forward.

## 2022-09-07 NOTE — Progress Notes (Signed)
Please inform patient of the following:  Cholesterol is elevated.  He needs to be on a cholesterol medication to reduce risk of future heart attack or stroke.  Please send in Lipitor 40 mg daily if he is agreeable to start.  His blood sugar is also in the prediabetic range.  We do not necessarily need to start medications for this however if he is interested we can start medications to improve this number and lower risk of progression to diabetes as well as lower risk of future heart attack and stroke.  We can discuss further at his next office visit.  All of his other labs are normal.

## 2022-09-20 ENCOUNTER — Ambulatory Visit: Payer: Medicare PPO | Admitting: Family Medicine

## 2022-09-26 ENCOUNTER — Ambulatory Visit (INDEPENDENT_AMBULATORY_CARE_PROVIDER_SITE_OTHER): Payer: Medicare PPO | Admitting: Family Medicine

## 2022-09-26 ENCOUNTER — Encounter: Payer: Self-pay | Admitting: Family Medicine

## 2022-09-26 VITALS — BP 122/88 | HR 87 | Temp 97.1°F | Ht 72.0 in | Wt 214.0 lb

## 2022-09-26 DIAGNOSIS — Z9861 Coronary angioplasty status: Secondary | ICD-10-CM

## 2022-09-26 DIAGNOSIS — I10 Essential (primary) hypertension: Secondary | ICD-10-CM

## 2022-09-26 DIAGNOSIS — E785 Hyperlipidemia, unspecified: Secondary | ICD-10-CM | POA: Diagnosis not present

## 2022-09-26 DIAGNOSIS — I2510 Atherosclerotic heart disease of native coronary artery without angina pectoris: Secondary | ICD-10-CM

## 2022-09-26 MED ORDER — CARVEDILOL 6.25 MG PO TABS: 6.2500 mg | ORAL_TABLET | Freq: Two times a day (BID) | ORAL | 3 refills | Status: DC

## 2022-09-26 MED ORDER — ATORVASTATIN CALCIUM 40 MG PO TABS: 40.0000 mg | ORAL_TABLET | Freq: Every day | ORAL | 3 refills | Status: DC

## 2022-09-26 NOTE — Assessment & Plan Note (Signed)
Will be restarting Lipitor 40 mg daily and adding on Coreg 6.25 mg as above.

## 2022-09-26 NOTE — Assessment & Plan Note (Signed)
He is agreeable to restart statin.  Will start Lipitor 40 mg daily.  He has previously done well with this.  We can recheck lipids in 6 to 12 months.

## 2022-09-26 NOTE — Patient Instructions (Signed)
It was very nice to see you today!  Your blood pressure looks better today but we will start a new medication to help bring it down lower.  Please start the carvedilol 6.25 mg twice daily.  I will also restart your cholesterol medication.  Please let me know in a few weeks how your blood pressure is looking and we can adjust the medications as needed.  Take care, Dr Jerline Pain  PLEASE NOTE:  If you had any lab tests, please let us know if you have not heard back within a few days. You may see your results on mychart before we have a chance to review them but we will give you a call once they are reviewed by [place].   If we ordered any referrals today, please let us know if you have not heard from their office within the next week.   If you had any urgent prescriptions sent in today, please check with the pharmacy within an hour of our visit to make sure the prescription was transmitted appropriately.   Please try these tips to maintain a healthy lifestyle:  Eat at least 3 REAL meals and 1-2 snacks per day.  Aim for no more than 5 hours between eating.  If you eat breakfast, please do so within one hour of getting up.   Each meal should contain half fruits/vegetables, one quarter protein, and one quarter carbs (no bigger than a computer mouse)  Cut down on sweet beverages. This includes juice, soda, and sweet tea.   Drink at least 1 glass of water with each meal and aim for at least 8 glasses per day  Exercise at least 150 minutes every week.

## 2022-09-26 NOTE — Assessment & Plan Note (Signed)
Blood pressure is at goal here however does still have several elevated readings at home.  We did discuss potentially adding on new medication which he is agreeable to.  We will start carvedilol 6.25 mg twice daily.  He will continue monitor at home and follow-up with [place] in a couple of weeks.

## 2022-09-26 NOTE — Progress Notes (Signed)
   Omar Singh is a 72 y.o. male who presents today for an office visit.  Assessment/Plan:  Chronic Problems Addressed Today: Essential hypertension Blood pressure is at goal here however does still have several elevated readings at home.  We did discuss potentially adding on new medication which he is agreeable to.  We will start carvedilol 6.25 mg twice daily.  He will continue monitor at home and follow-up with [place] in a couple of weeks.  Dyslipidemia He is agreeable to restart statin.  Will start Lipitor 40 mg daily.  He has previously done well with this.  We can recheck lipids in 6 to 12 months.  CAD S/P RCA BMS '08, new RCA BMS 03/25/15 Will be restarting Lipitor 40 mg daily and adding on Coreg 6.25 mg as above.     Subjective:  HPI:  See A/P for status of chronic condition.  Patient here today for follow-up.  We saw him a few weeks ago for reestablishment of care.  At that visit blood pressure was extremely elevated.  We restarted amlodipine 10 mg daily.  We also had a labs which showed A1c 6.1 and elevated cholesterol.  He has done well with amlodipine without any significant side effects.  Home blood pressure readings are still elevated though better than where it has been in the past.  Typically getting in the 150s to 170s over 80s to 90s at home.       Objective:  Physical Exam: BP 122/88   Pulse 87   Temp (!) 97.1 F (36.2 C) (Temporal)   Ht 6' (1.829 m)   Wt 214 lb (97.1 kg)   SpO2 96%   BMI 29.02 kg/m   Gen: No acute distress, resting comfortably CV: Regular rate and rhythm with no murmurs appreciated Pulm: Normal work of breathing, clear to auscultation bilaterally with no crackles, wheezes, or rhonchi Neuro: Grossly normal, moves all extremities Psych: Normal affect and thought content      Yisel Megill M. Jerline Pain, MD 09/26/2022 10:40 AM

## 2022-09-29 ENCOUNTER — Other Ambulatory Visit: Payer: Self-pay | Admitting: Family Medicine

## 2022-10-23 ENCOUNTER — Telehealth: Payer: Self-pay | Admitting: Family Medicine

## 2022-10-23 NOTE — Telephone Encounter (Signed)
Can we have him schedule an appointment here to recheck and adjust meds if needed?  Algis Greenhouse. Jerline Pain, MD 10/23/2022 3:01 PM

## 2022-10-23 NOTE — Telephone Encounter (Signed)
Patient called to inform Dr Jerline Pain of BP readings - Patient stated these are all Evening readings- Dates not noted.  162/85, 175/91, 184/97, 125/64

## 2022-10-24 ENCOUNTER — Encounter: Payer: Self-pay | Admitting: Physician Assistant

## 2022-10-24 ENCOUNTER — Telehealth: Payer: Self-pay | Admitting: Physician Assistant

## 2022-10-24 ENCOUNTER — Telehealth (INDEPENDENT_AMBULATORY_CARE_PROVIDER_SITE_OTHER): Payer: Medicare PPO | Admitting: Physician Assistant

## 2022-10-24 VITALS — BP 153/65 | Ht 72.0 in | Wt 192.0 lb

## 2022-10-24 DIAGNOSIS — J449 Chronic obstructive pulmonary disease, unspecified: Secondary | ICD-10-CM

## 2022-10-24 DIAGNOSIS — I10 Essential (primary) hypertension: Secondary | ICD-10-CM | POA: Diagnosis not present

## 2022-10-24 DIAGNOSIS — U071 COVID-19: Secondary | ICD-10-CM

## 2022-10-24 MED ORDER — PREDNISONE 20 MG PO TABS: 40.0000 mg | ORAL_TABLET | Freq: Every day | ORAL | 0 refills | Status: DC

## 2022-10-24 MED ORDER — MOLNUPIRAVIR EUA 200MG CAPSULE: 4.0000 | ORAL_CAPSULE | Freq: Two times a day (BID) | ORAL | 0 refills | Status: DC

## 2022-10-24 NOTE — Progress Notes (Signed)
Virtual Visit via Video   I connected with Omar Singh on 10/24/22 at 10:40 AM EST by a video enabled telemedicine application and verified that I am speaking with the correct person using two identifiers. Location patient: Home Location provider:  [facility], Office Persons participating in the virtual visit: [place], [place] PA-C, Anselmo Pickler, LPN   I discussed the limitations of evaluation and management by telemedicine and the availability of in person appointments. The patient expressed understanding and agreed to proceed.  I acted as a Education administrator for [employer], PA-C [employer], LPN   Subjective:   HPI:   COVID-19 Positive Symptom onset: 10/22/2022 Travel/contacts: ex-wife's husband -- Monday Vaccination status: 3 vaccines Testing results: Home test positive last night  Patient endorses the following symptoms: sore throat, headache, body aches, cough and expectorating yellow sputum, nasal congestion, some SOB.  Patient denies the following symptoms: chest pain, shortness of breath  Treatments tried: Has not taken any medications.   HTN Currently taking amlodipine 10 mg daily and carvedilol 6.25 mg BID. At home blood pressure readings are: see below. Patient denies chest pain, SOB, blurred vision, dizziness, unusual headaches, lower leg swelling. Patient is compliant with medication. Denies excessive caffeine intake, stimulant usage, excessive alcohol intake, or increase in salt consumption.  153/65 121/52 192/76 172/99 184/114 184/93 170/163 153/65  I did ask patient if he has ever had his BP cuff checked against an office cuff and he has not.  ROS: See pertinent positives and negatives per HPI.  Patient Active Problem List   Diagnosis Date Noted   Dyslipidemia 02/10/2019   Essential hypertension 01/11/2019   COPD (chronic obstructive pulmonary disease) ([place]) 01/10/2019   PVD (peripheral vascular disease) ([place])    Coronary  artery disease involving coronary bypass graft of native heart with angina pectoris ([facility])    PAF (paroxysmal atrial fibrillation) ([place]) 03/26/2015   CAD S/P RCA BMS '08, new RCA BMS 03/25/15 03/23/2015   Pulmonary nodule 03/23/2015   Alcohol abuse 03/23/2015   Former smoker 03/23/2015    Social History   Tobacco Use   Smoking status: Former    Packs/day: 0.25    Years: 47.00    Total pack years: 11.75    Types: Cigarettes    Quit date: 05/19/2019    Years since quitting: 3.4   Smokeless tobacco: Never  Substance Use Topics   Alcohol use: Not Currently    Alcohol/week: 56.0 standard drinks of alcohol    Types: 56 Cans of beer per week    Comment: quit 1 year ago     Current Outpatient Medications:    amLODipine (NORVASC) 10 MG tablet, Take 1 tablet (10 mg total) by mouth daily., Disp: 90 tablet, Rfl: 3   atorvastatin (LIPITOR) 40 MG tablet, Take 1 tablet (40 mg total) by mouth daily., Disp: 90 tablet, Rfl: 3   carvedilol (COREG) 6.25 MG tablet, Take 1 tablet (6.25 mg total) by mouth 2 (two) times daily with a meal., Disp: 60 tablet, Rfl: 3   molnupiravir EUA (LAGEVRIO) 200 mg CAPS capsule, Take 4 capsules (800 mg total) by mouth 2 (two) times daily for 5 days., Disp: 40 capsule, Rfl: 0   predniSONE (DELTASONE) 20 MG tablet, Take 2 tablets (40 mg total) by mouth daily., Disp: 10 tablet, Rfl: 0  No Known Allergies  Objective:   VITALS: Per patient if applicable, see vitals. GENERAL: Alert, appears well and in no acute distress. HEENT: Atraumatic, conjunctiva clear, no obvious  abnormalities on inspection of external nose and ears. NECK: Normal movements of the head and neck. CARDIOPULMONARY: No increased WOB. Speaking in clear sentences. I:E ratio WNL.  MS: Moves all visible extremities without noticeable abnormality. PSYCH: Pleasant and cooperative, well-groomed. Speech normal rate and rhythm. Affect is appropriate. Insight and judgement are appropriate. Attention is focused,  linear, and appropriate.  NEURO: CN grossly intact. Oriented as arrived to appointment on time with no prompting. Moves both UE equally.  SKIN: No obvious lesions, wounds, erythema, or cyanosis noted on face or hands.  Assessment and Plan:   Dietrich was seen today for covid positive.  Diagnoses and all orders for this visit:  COVID-19; Chronic obstructive pulmonary disease, unspecified COPD type ([place]) No red flags on discussion, patient is not in any obvious distress during our visit. We will start molnupiravir, given concerns for medication interaction (statin) with paxlovid. We will also start prednisone per orders given dx of COPD. Declines prior use or current need for inhalers. Discussed over the counter supportive care options, including Tylenol 500 mg q 8 hours, with recommendations to push fluids and rest. Reviewed return precautions including new/worsening fever, SOB, new/worsening cough, sudden onset changes of symptoms. Recommended need to self-quarantine and practice social distancing until symptoms resolve. I recommend that patient follow-up if symptoms worsen or persist despite treatment x 7-10 days, sooner if needed.  Essential hypertension Blood pressure slightly above goal today He is currently taking amlodopine 10 mg and coreg 6.25 mg BID He has asked that I fwd BP readings to PCP to advise on next steps Denies chest pain, changes in vision, SOB, severe HA  Other orders -     predniSONE (DELTASONE) 20 MG tablet; Take 2 tablets (40 mg total) by mouth daily. -     molnupiravir EUA (LAGEVRIO) 200 mg CAPS capsule; Take 4 capsules (800 mg total) by mouth 2 (two) times daily for 5 days.  I discussed the assessment and treatment plan with the patient. The patient was provided an opportunity to ask questions and all were answered. The patient agreed with the plan and demonstrated an understanding of the instructions.   The patient was advised to call back or seek an in-person  evaluation if the symptoms worsen or if the condition fails to improve as anticipated.   CMA or LPN served as scribe during this visit. History, Physical, and Plan performed by medical provider. The above documentation has been reviewed and is accurate and complete.   [place], [place] 10/24/2022

## 2022-10-24 NOTE — Telephone Encounter (Signed)
Patient saw me today for COVID-19 virtually.  He is also requesting to use this time to follow-up on BP. He saw you on 09/26/22. You added Coreg 6.25 mg BID to his existing Norvasc 10 mg. He is taking both regularly.  Here are his current readings since starting coreg:  153/65 121/52 192/76 172/99 184/114 184/93 170/163 153/65  I did ask patient if he has ever had his BP cuff checked against an office cuff and he has not.  He is asking what next steps are on BP regimen. I discussed that you are out of office and I would forward this to you to review.  Thanks!  Inda Coke PA-C

## 2022-10-24 NOTE — Telephone Encounter (Signed)
Please schedule OV with PCP

## 2022-10-25 NOTE — Telephone Encounter (Signed)
LVMTCB to schedule OV with PCP

## 2022-10-25 NOTE — Telephone Encounter (Signed)
Please have him schedule appointment here and bring his home cuff so that we can compare.  Algis Greenhouse. Jerline Pain, MD 10/25/2022 12:34 PM

## 2022-10-26 NOTE — Telephone Encounter (Signed)
Please schedule Ov with PCP let him know to bring his BP monitor

## 2022-10-29 ENCOUNTER — Encounter: Payer: Self-pay | Admitting: Family Medicine

## 2022-10-29 ENCOUNTER — Ambulatory Visit (INDEPENDENT_AMBULATORY_CARE_PROVIDER_SITE_OTHER): Payer: Medicare PPO | Admitting: Family Medicine

## 2022-10-29 VITALS — BP 127/73 | HR 68 | Temp 97.3°F | Ht 72.0 in | Wt 208.0 lb

## 2022-10-29 DIAGNOSIS — J449 Chronic obstructive pulmonary disease, unspecified: Secondary | ICD-10-CM

## 2022-10-29 DIAGNOSIS — I10 Essential (primary) hypertension: Secondary | ICD-10-CM | POA: Diagnosis not present

## 2022-10-29 MED ORDER — GUAIFENESIN-CODEINE 100-10 MG/5ML PO SOLN: 5.0000 mL | Freq: Three times a day (TID) | ORAL | 0 refills | Status: DC | PRN

## 2022-10-29 NOTE — Patient Instructions (Signed)
It was very nice to see you today!  Your blood pressure looks good here.  We will continue your current blood pressure medications.  Please make sure that you are getting plenty of fluids and stay well-hydrated.  Please try stretching your feet and calves at night.  I will send a cough medication in for you.  Will see you back in a year for your annual checkup.  Please come back sooner if needed.  Take care, Dr Jerline Pain  PLEASE NOTE:  If you had any lab tests, please let us know if you have not heard back within a few days. You may see your results on mychart before we have a chance to review them but we will give you a call once they are reviewed by [place].   If we ordered any referrals today, please let us know if you have not heard from their office within the next week.   If you had any urgent prescriptions sent in today, please check with the pharmacy within an hour of our visit to make sure the prescription was transmitted appropriately.   Please try these tips to maintain a healthy lifestyle:  Eat at least 3 REAL meals and 1-2 snacks per day.  Aim for no more than 5 hours between eating.  If you eat breakfast, please do so within one hour of getting up.   Each meal should contain half fruits/vegetables, one quarter protein, and one quarter carbs (no bigger than a computer mouse)  Cut down on sweet beverages. This includes juice, soda, and sweet tea.   Drink at least 1 glass of water with each meal and aim for at least 8 glasses per day  Exercise at least 150 minutes every week.

## 2022-10-29 NOTE — Progress Notes (Signed)
Omar Singh is a 72 y.o. male who presents today for an office visit.  Assessment/Plan:  New/Acute Problems: Nocturnal cramping No red flags.  Recommended good hydration.  Also discussed stretching before going to bed.  He can try over-the-counter b complex or vitamin D supplements as well.  Recently had labs a few weeks ago which was reassuring-do not need to repeat today.  He will let me know if symptoms or not improving.  COVID Has mostly covered though still has persistent cough.  Will send in small supply of guaifenesin-codeine cough syrup.  He has been on this in the past and has done well with this.  We did discuss potential side effects.  He will let me know if cough is not improving over the next 2 weeks.  Chronic Problems Addressed Today: Essential hypertension Blood pressure well-controlled here and he is tolerating current regimen of amlodipine 10 mg daily and Coreg 6.25 mg twice daily well.  His home blood pressure cuff is reading 5-10 points higher than ours and his home readings are most likely at goal as well.  Will continue his current regimen Norvasc 10 mg daily and Coreg 6.25 mg daily.  He will continue to monitor at home and let us know if persistently elevated.  COPD (chronic obstructive pulmonary disease) (HCC) Symptoms are overall stable.  Did have a mild flare recently due to Omar Singh.  Was given prednisone burst and molnupiravir.  With exception of above cough he is now back to baseline without any signs of acute exacerbation.     Subjective:  HPI:  Patient here today for follow-up.  We last saw him about a month ago.  At our last visit his home blood pressure readings were elevated however they were at goal in the office.  We did discuss starting medications and we added on carvedilol 6.25 mg twice daily.  He unfortunately acquired COVID a couple of weeks ago.  Had a virtual visit a week ago and was treated for this.  At that time blood pressure readings at  home were found to be elevated again with readings into the 150s, 170s, and 180s.  Patient tells me that he did not have his glasses on at the time he was checking his blood pressure and that his actual blood pressure readings were actually in the 130s to 140s.  He has a record of blood pressure log over the last week or so with majority of readings in the 130s to 140s with an occasional elevation into the 150s.  Overall feels like he is doing well.  Still does have quite a bit of a lingering cough from COVID.  No chest pain.  No shortness of breath.  No difficulty breathing.  He is also had some issues with cramping in his feet at night.  This occurs only when sleeping.  Seems to be worsening over the last month or so.  No treatments tried.        Objective:  Physical Exam: BP 127/73   Pulse 68   Temp (!) 97.3 F (36.3 C) (Temporal)   Ht 6' (1.829 m)   Wt 208 lb (94.3 kg)   SpO2 96%   BMI 28.21 kg/m   Gen: No acute distress, resting comfortably CV: Regular rate and rhythm with no murmurs appreciated Pulm: Normal work of breathing, clear to auscultation bilaterally with no crackles, wheezes, or rhonchi Neuro: Grossly normal, moves all extremities Psych: Normal affect and thought content  Algis Greenhouse. Jerline Pain, MD 10/29/2022 1:29 PM

## 2022-10-29 NOTE — Assessment & Plan Note (Signed)
Blood pressure well-controlled here and he is tolerating current regimen of amlodipine 10 mg daily and Coreg 6.25 mg twice daily well.  His home blood pressure cuff is reading 5-10 points higher than ours and his home readings are most likely at goal as well.  Will continue his current regimen Norvasc 10 mg daily and Coreg 6.25 mg daily.  He will continue to monitor at home and let us know if persistently elevated.

## 2022-10-29 NOTE — Assessment & Plan Note (Signed)
Symptoms are overall stable.  Did have a mild flare recently due to [place].  Was given prednisone burst and molnupiravir.  With exception of above cough he is now back to baseline without any signs of acute exacerbation.

## 2022-11-29 ENCOUNTER — Other Ambulatory Visit: Payer: Self-pay | Admitting: Family Medicine

## 2022-12-10 ENCOUNTER — Telehealth: Payer: Self-pay

## 2022-12-10 DIAGNOSIS — I70229 Atherosclerosis of native arteries of extremities with rest pain, unspecified extremity: Secondary | ICD-10-CM

## 2022-12-10 DIAGNOSIS — I739 Peripheral vascular disease, unspecified: Secondary | ICD-10-CM

## 2022-12-10 NOTE — Telephone Encounter (Signed)
Pt called c/o BL feet swelling and numbness.  Reviewed pt's chart, returned call for clarification, two identifiers used. Pt c/o 8/10 BLE pain, especially in the feet with rest and ambulation. The RLE is worse than the L. He has been elevating with no success at reduction in swelling. He thinks the pain is from the pronounced swelling, but isn't sure. His feet stay cold, he wears socks constantly to keep warm, but is not wearing compressions. Encouraged walking and compressions. Pt had a long overdue recall. Pt appts for [place] and PA scheduled. Confirmed understanding.

## 2022-12-20 ENCOUNTER — Ambulatory Visit: Payer: Medicare PPO | Admitting: Physician Assistant

## 2022-12-20 ENCOUNTER — Telehealth (HOSPITAL_COMMUNITY): Payer: Self-pay | Admitting: *Deleted

## 2022-12-20 ENCOUNTER — Encounter: Payer: Self-pay | Admitting: Physician Assistant

## 2022-12-20 ENCOUNTER — Ambulatory Visit (INDEPENDENT_AMBULATORY_CARE_PROVIDER_SITE_OTHER)
Admission: RE | Admit: 2022-12-20 | Discharge: 2022-12-20 | Disposition: A | Discharge status: Home / Self Care | Payer: Medicare PPO | Source: Ambulatory Visit | Attending: Vascular Surgery | Admitting: Vascular Surgery

## 2022-12-20 ENCOUNTER — Other Ambulatory Visit: Payer: Self-pay

## 2022-12-20 ENCOUNTER — Ambulatory Visit (HOSPITAL_COMMUNITY)
Admission: RE | Admit: 2022-12-20 | Discharge: 2022-12-20 | Disposition: A | Discharge status: Home / Self Care | Payer: Medicare PPO | Source: Ambulatory Visit | Attending: Vascular Surgery | Admitting: Vascular Surgery

## 2022-12-20 VITALS — BP 126/74 | HR 62 | Temp 97.0°F | Resp 18 | Ht 72.0 in | Wt 219.1 lb

## 2022-12-20 DIAGNOSIS — I70229 Atherosclerosis of native arteries of extremities with rest pain, unspecified extremity: Secondary | ICD-10-CM

## 2022-12-20 DIAGNOSIS — I739 Peripheral vascular disease, unspecified: Secondary | ICD-10-CM

## 2022-12-20 LAB — VAS US ABI WITH/WO TBI
Left ABI: 0.67
Right ABI: 0.99

## 2022-12-20 NOTE — H&P (View-Only) (Signed)
[facility] HISTORY AND PHYSICAL   History of Present Illness:  Patient is a 71 y.o. year old male who presents for evaluation of PAD.  He has a history of right LE rest pain and is s/p Profundoplasty with vein patch angioplasty using right great saphenous vein and  Right femoral to anterior tibial artery bypass with non-reversed translocated saphenous vein graft by Dr. Dickson on 01/08/19.  His arteriogram showed an occluded superficial femoral artery and a tight stenosis in the proximal deep femoral artery. There is reconstitution of the anterior tibial artery which was small.   He was last seen in our office on 02/01/20.  At that time he was doing well and had no rest pain.  He reports today that he has had claudication symptoms with progressive short distance claudication for a full year.  Now he can only walk 50 feet before he has to stop and rest. He developed lower right leg edema 2 weeks ago.  He denies rest pain and  non healing wounds.  He denise erythema, fever,  or chills.    PMH: he has history of Afib, not currently on anticoagulation, Hyperlipidemia on daily Statin.           Past Medical History:  Diagnosis Date   Acute exacerbation of chronic obstructive pulmonary disease (COPD)    /notes 03/23/2015   Alcohol abuse    CAD (coronary artery disease)    a. s/p multi-link vision stent to the mid RCA in 2007 at [place].   Hyperlipidemia    Hypertension    Myocardial infarction 2008   /notes 03/23/2015   PAD (peripheral artery disease)    Panic attack    Tobacco abuse     Past Surgical History:  Procedure Laterality Date   CARDIAC CATHETERIZATION N/A 03/25/2015   Procedure: Left Heart Cath and Coronary Angiography;  Surgeon: Christopher D McAlhany, MD;  Location: [facility];  Service: Cardiovascular;  Laterality: N/A;   CARDIAC CATHETERIZATION N/A 03/25/2015   Procedure: Coronary Stent Intervention;  Surgeon: Christopher D McAlhany, MD;   Location: [facility];  Service: Cardiovascular;  Laterality: N/A;   CORONARY ANGIOPLASTY WITH STENT PLACEMENT  2008   "1"   FEMORAL-TIBIAL BYPASS GRAFT Right 01/08/2019   Procedure: RIGHT FEMORAL Endartarectomy and profundoplasty using greater saphenous vein graft. Right anterior tibial artery bypass with nonreversed saphenous vein graft;  Surgeon: Dickson, Christopher S, MD;  Location: [facility] OR;  Service: Vascular;  Laterality: Right;   LOWER EXTREMITY ANGIOGRAM Right 01/08/2019   Procedure: Lower Extremity Angiogram;  Surgeon: Dickson, Christopher S, MD;  Location: [facility] OR;  Service: Vascular;  Laterality: Right;   LOWER EXTREMITY ANGIOGRAPHY N/A 01/06/2019   Procedure: LOWER EXTREMITY ANGIOGRAPHY;  Surgeon: Brabham, Vance W, MD;  Location: [facility];  Service: Cardiovascular;  Laterality: N/A;   TONSILLECTOMY      ROS:   General:  No weight loss, Fever, chills  HEENT: No recent headaches, no nasal bleeding, no visual changes, no sore throat  Neurologic: No dizziness, blackouts, seizures. No recent symptoms of stroke or mini- stroke. No recent episodes of slurred speech, or temporary blindness.  Cardiac: No recent episodes of chest pain/pressure, no shortness of breath at rest.  No shortness of breath with exertion.  Denies history of atrial fibrillation or irregular heartbeat  Vascular: No history of rest pain in feet.  No history of claudication.  No history of non-healing ulcer, No history of DVT     Pulmonary: No home oxygen, no productive cough, no hemoptysis,  No asthma or wheezing  Musculoskeletal:  [ ] Arthritis, [ ] Low back pain,  [ ] Joint pain  Hematologic:No history of hypercoagulable state.  No history of easy bleeding.  No history of anemia  Gastrointestinal: No hematochezia or melena,  No gastroesophageal reflux, no trouble swallowing  Urinary: [ ] chronic Kidney disease, [ ] on HD - [ ] MWF or [ ] TTHS, [ ] Burning with urination, [ ] Frequent urination, [ ]  Difficulty urinating;   Skin: No rashes  Psychological: No history of anxiety,  No history of depression  Social History Social History   Tobacco Use   Smoking status: Former    Packs/day: 0.25    Years: 47.00    Additional pack years: 0.00    Total pack years: 11.75    Types: Cigarettes    Quit date: 05/19/2019    Years since quitting: 3.5   Smokeless tobacco: Never  Vaping Use   Vaping Use: Never used  Substance Use Topics   Alcohol use: Not Currently    Alcohol/week: 56.0 standard drinks of alcohol    Types: 56 Cans of beer per week    Comment: quit 1 year ago    Drug use: No    Family History Family History  Problem Relation Age of Onset   CVA Other        Grandparents had strokes   Cancer Neg Hx     Allergies  No Known Allergies   Current Outpatient Medications  Medication Sig Dispense Refill   amLODipine (NORVASC) 10 MG tablet Take 1 tablet (10 mg total) by mouth daily. 90 tablet 3   atorvastatin (LIPITOR) 40 MG tablet Take 1 tablet (40 mg total) by mouth daily. 90 tablet 3   carvedilol (COREG) 6.25 MG tablet TAKE 1 TABLET BY MOUTH 2 TIMES DAILY WITH A MEAL. 180 tablet 1   guaiFENesin-codeine 100-10 MG/5ML syrup Take 5 mLs by mouth 3 (three) times daily as needed for cough. (Patient not taking: Reported on 12/20/2022) 120 mL 0   predniSONE (DELTASONE) 20 MG tablet Take 2 tablets (40 mg total) by mouth daily. (Patient not taking: Reported on 12/20/2022) 10 tablet 0   No current facility-administered medications for this visit.    Physical Examination  Vitals:   12/20/22 1202  BP: 126/74  Pulse: 62  Resp: 18  Temp: (!) 97 F (36.1 C)  TempSrc: Temporal  SpO2: 96%  Weight: 219 lb 1.6 oz (99.4 kg)  Height: 6' (1.829 m)    Body mass index is 29.72 kg/m.  General:  Alert and oriented, no acute distress HEENT: Normal Neck: No bruit or JVD Pulmonary: Clear to auscultation bilaterally Cardiac: Regular Rate and Rhythm without murmur Abdomen: Soft,  non-tender, non-distended, no mass, no scars Skin: No rash, no erythema, open wounds or weeping right LE  Extremity Pulses:  2+ radial, brachial, femoral, dorsalis pedis, posterior tibial pulses bilaterally Musculoskeletal: below the knee to the ankle pitting edema  Neurologic: Upper and lower extremity motor grossly intact and symmetric  DATA:   ABI Findings:  +---------+------------------+-----+----------+--------+  Right   Rt Pressure (mmHg)IndexWaveform  Comment   +---------+------------------+-----+----------+--------+  Brachial 139                                        +---------+------------------+-----+----------+--------+  PTA       137               0.99 monophasic          +---------+------------------+-----+----------+--------+  DP      130               0.94 biphasic            +---------+------------------+-----+----------+--------+  Great Toe102               0.73 Normal              +---------+------------------+-----+----------+--------+   +---------+------------------+-----+----------+-------+  Left    Lt Pressure (mmHg)IndexWaveform  Comment  +---------+------------------+-----+----------+-------+  Brachial 136                                       +---------+------------------+-----+----------+-------+  PTA     93                0.67 monophasic         +---------+------------------+-----+----------+-------+  DP      72                0.52 monophasic         +---------+------------------+-----+----------+-------+  Great Toe66                0.47 Abnormal           +---------+------------------+-----+----------+-------+   +-------+-----------+-----------+------------+------------+  ABI/TBIToday's ABIToday's TBIPrevious ABIPrevious TBI  +-------+-----------+-----------+------------+------------+  Right 0.99       0.73       1.00        0.78           +-------+-----------+-----------+------------+------------+  Left  0.67       0.47       0.69        0.47          +-------+-----------+-----------+------------+------------+         Bilateral ABIs and TBIs appear essentially unchanged.    Summary:  Right: Resting right ankle-brachial index is within normal range. The  right toe-brachial index is normal.   Left: Resting left ankle-brachial index indicates moderate left lower  extremity arterial disease. The left toe-brachial index is abnormal.   +-----------+--------+-----+--------+----------+---------------------------  ----+  RIGHT     PSV cm/sRatioStenosisWaveform  Comments                          +-----------+--------+-----+--------+----------+---------------------------  ----+  EIA Distal 175                  biphasic  Enlarged artery with  homogenous                                           plaque                            +-----------+--------+-----+--------+----------+---------------------------  ----+  ATA Distal 71                   monophasic                                  +-----------+--------+-----+--------+----------+---------------------------  ----+  PTA Distal 28                     biphasic                                    +-----------+--------+-----+--------+----------+---------------------------  ----+  PERO Distal41                   monophasic                                  +-----------+--------+-----+--------+----------+---------------------------  ----+       Right Graft #1:  +------------------+--------+--------+----------+--------------------------  -+                   PSV cm/sStenosisWaveform  Comments                      +------------------+--------+--------+----------+--------------------------  -+  Inflow           52              triphasic dampened, visualized  plaque   +------------------+--------+--------+----------+--------------------------  -+  Prox Anastomosis  39              monophasic                              +------------------+--------+--------+----------+--------------------------  -+  Proximal Graft    54              biphasic  dampened                      +------------------+--------+--------+----------+--------------------------  -+  Mid Graft         71                                                      +------------------+--------+--------+----------+--------------------------  -+  Distal Graft      71              biphasic                                +------------------+--------+--------+----------+--------------------------  -+  Distal Anastomosis167             monophasic                              +------------------+--------+--------+----------+--------------------------  -+  Outflow          161             monophasicdiamater change               +------------------+--------+--------+----------+--------------------------  -+      Summary:  Patent graft with no visualized stenosis. Enlarged distal EIA artery with  visualized plaque.     ASSESSMENT/PLAN:    PAD with history of ischemic right LE with rest pain requiring fem-AT bypass He has developed short distance claudication over the lat year.  He is able to walk about 50 feet before he has to stop and rest.  The right LE claudication is > the left.  He has no history of Left LE symptoms in the past.  The ABI's are unchanged   from 2021.   He was concerned even more when he developed right LE edema 2 weeks ago.  The swelling does go down over night.     The bypass duplex shows decreased flow rates with a drop in inflow from 150 to 52.  This is an indication of pending occlusion.  The duplex demonstrates EXT iliac stenosis.    He will stay as active as he can, elevate for swelling alt. With movement.  I will schedule him  for Aortoangiogram with run off.  He is not on anticoagulation at this time.      Tianne Plott Maureen Viana Sleep PA-C [facility] Office: [phone]  MD in clinic [place] 

## 2022-12-20 NOTE — Progress Notes (Signed)
[facility] [place] HISTORY AND PHYSICAL   History of Present Illness:  Patient is a 72 y.o. year old male who presents for evaluation of PAD.  He has a history of right LE rest pain and is s/p Profundoplasty with vein patch angioplasty using right great saphenous vein and  Right femoral to anterior tibial artery bypass with non-reversed translocated saphenous vein graft by Dr. Scot Dock on 01/08/19.  His arteriogram showed an occluded superficial femoral artery and a tight stenosis in the proximal deep femoral artery. There is reconstitution of the anterior tibial artery which was small.   He was last seen in our office on 02/01/20.  At that time he was doing well and had no rest pain.  He reports today that he has had claudication symptoms with progressive short distance claudication for a full year.  Now he can only walk 50 feet before he has to stop and rest. He developed lower right leg edema 2 weeks ago.  He denies rest pain and  non healing wounds.  He denise erythema, fever,  or chills.    PMH: he has history of Afib, not currently on anticoagulation, Hyperlipidemia on daily Statin.           Past Medical History:  Diagnosis Date   Acute exacerbation of chronic obstructive pulmonary disease (COPD)    /notes 03/23/2015   Alcohol abuse    CAD (coronary artery disease)    a. s/p multi-link vision stent to the mid RCA in 2007 at [facility].   Hyperlipidemia    Hypertension    Myocardial infarction 2008   /notes 03/23/2015   PAD (peripheral artery disease)    Panic attack    Tobacco abuse     Past Surgical History:  Procedure Laterality Date   CARDIAC CATHETERIZATION N/A 03/25/2015   Procedure: Left Heart Cath and Coronary Angiography;  Surgeon: Burnell Blanks, MD;  Location: [facility];  Service: Cardiovascular;  Laterality: N/A;   CARDIAC CATHETERIZATION N/A 03/25/2015   Procedure: Coronary Stent Intervention;  Surgeon: Burnell Blanks, MD;   Location: [facility];  Service: Cardiovascular;  Laterality: N/A;   CORONARY ANGIOPLASTY WITH STENT PLACEMENT  2008   "1"   FEMORAL-TIBIAL BYPASS GRAFT Right 01/08/2019   Procedure: RIGHT FEMORAL Endartarectomy and profundoplasty using greater saphenous vein graft. Right anterior tibial artery bypass with nonreversed saphenous vein graft;  Surgeon: Angelia Mould, MD;  Location: [facility];  Service: Vascular;  Laterality: Right;   LOWER EXTREMITY ANGIOGRAM Right 01/08/2019   Procedure: Lower Extremity Angiogram;  Surgeon: Angelia Mould, MD;  Location: [facility];  Service: Vascular;  Laterality: Right;   LOWER EXTREMITY ANGIOGRAPHY N/A 01/06/2019   Procedure: LOWER EXTREMITY ANGIOGRAPHY;  Surgeon: Serafina Mitchell, MD;  Location: [facility];  Service: Cardiovascular;  Laterality: N/A;   TONSILLECTOMY      ROS:   General:  No weight loss, Fever, chills  HEENT: No recent headaches, no nasal bleeding, no visual changes, no sore throat  Neurologic: No dizziness, blackouts, seizures. No recent symptoms of stroke or mini- stroke. No recent episodes of slurred speech, or temporary blindness.  Cardiac: No recent episodes of chest pain/pressure, no shortness of breath at rest.  No shortness of breath with exertion.  Denies history of atrial fibrillation or irregular heartbeat  Vascular: No history of rest pain in feet.  No history of claudication.  No history of non-healing ulcer, No history of DVT  Pulmonary: No home oxygen, no productive cough, no hemoptysis,  No asthma or wheezing  Musculoskeletal:  [ ]  Arthritis, [ ]  Low back pain,  [ ]  Joint pain  Hematologic:No history of hypercoagulable state.  No history of easy bleeding.  No history of anemia  Gastrointestinal: No hematochezia or melena,  No gastroesophageal reflux, no trouble swallowing  Urinary: [ ]  chronic Kidney disease, [ ]  on HD - [ ]  MWF or [ ]  TTHS, [ ]  Burning with urination, [ ]  Frequent urination, [ ]   Difficulty urinating;   Skin: No rashes  Psychological: No history of anxiety,  No history of depression  Social History Social History   Tobacco Use   Smoking status: Former    Packs/day: 0.25    Years: 47.00    Additional pack years: 0.00    Total pack years: 11.75    Types: Cigarettes    Quit date: 05/19/2019    Years since quitting: 3.5   Smokeless tobacco: Never  Vaping Use   Vaping Use: Never used  Substance Use Topics   Alcohol use: Not Currently    Alcohol/week: 56.0 standard drinks of alcohol    Types: 56 Cans of beer per week    Comment: quit 1 year ago    Drug use: No    Family History Family History  Problem Relation Age of Onset   CVA Other        Grandparents had strokes   Cancer Neg Hx     Allergies  No Known Allergies   Current Outpatient Medications  Medication Sig Dispense Refill   amLODipine (NORVASC) 10 MG tablet Take 1 tablet (10 mg total) by mouth daily. 90 tablet 3   atorvastatin (LIPITOR) 40 MG tablet Take 1 tablet (40 mg total) by mouth daily. 90 tablet 3   carvedilol (COREG) 6.25 MG tablet TAKE 1 TABLET BY MOUTH 2 TIMES DAILY WITH A MEAL. 180 tablet 1   guaiFENesin-codeine 100-10 MG/5ML syrup Take 5 mLs by mouth 3 (three) times daily as needed for cough. (Patient not taking: Reported on 12/20/2022) 120 mL 0   predniSONE (DELTASONE) 20 MG tablet Take 2 tablets (40 mg total) by mouth daily. (Patient not taking: Reported on 12/20/2022) 10 tablet 0   No current facility-administered medications for this visit.    Physical Examination  Vitals:   12/20/22 1202  BP: 126/74  Pulse: 62  Resp: 18  Temp: (!) 97 F (36.1 C)  TempSrc: Temporal  SpO2: 96%  Weight: 219 lb 1.6 oz (99.4 kg)  Height: 6' (1.829 m)    Body mass index is 29.72 kg/m.  General:  Alert and oriented, no acute distress HEENT: Normal Neck: No bruit or JVD Pulmonary: Clear to auscultation bilaterally Cardiac: Regular Rate and Rhythm without murmur Abdomen: Soft,  non-tender, non-distended, no mass, no scars Skin: No rash, no erythema, open wounds or weeping right LE  Extremity Pulses:  2+ radial, brachial, femoral, dorsalis pedis, posterior tibial pulses bilaterally Musculoskeletal: below the knee to the ankle pitting edema  Neurologic: Upper and lower extremity motor grossly intact and symmetric  DATA:   ABI Findings:  +---------+------------------+-----+----------+--------+  Right   Rt Pressure (mmHg)IndexWaveform  Comment   +---------+------------------+-----+----------+--------+  Brachial 139                                        +---------+------------------+-----+----------+--------+  PTA  137               0.99 monophasic          +---------+------------------+-----+----------+--------+  DP      130               0.94 biphasic            +---------+------------------+-----+----------+--------+  Great Toe102               0.73 Normal              +---------+------------------+-----+----------+--------+   +---------+------------------+-----+----------+-------+  Left    Lt Pressure (mmHg)IndexWaveform  Comment  +---------+------------------+-----+----------+-------+  Brachial 136                                       +---------+------------------+-----+----------+-------+  PTA     93                0.67 monophasic         +---------+------------------+-----+----------+-------+  DP      72                0.52 monophasic         +---------+------------------+-----+----------+-------+  Great Toe66                0.47 Abnormal           +---------+------------------+-----+----------+-------+   +-------+-----------+-----------+------------+------------+  ABI/TBIToday's ABIToday's TBIPrevious ABIPrevious TBI  +-------+-----------+-----------+------------+------------+  Right 0.99       0.73       1.00        0.78           +-------+-----------+-----------+------------+------------+  Left  0.67       0.47       0.69        0.47          +-------+-----------+-----------+------------+------------+         Bilateral ABIs and TBIs appear essentially unchanged.    Summary:  Right: Resting right ankle-brachial index is within normal range. The  right toe-brachial index is normal.   Left: Resting left ankle-brachial index indicates moderate left lower  extremity arterial disease. The left toe-brachial index is abnormal.   +-----------+--------+-----+--------+----------+---------------------------  ----+  RIGHT     PSV cm/sRatioStenosisWaveform  Comments                          +-----------+--------+-----+--------+----------+---------------------------  ----+  EIA Distal 175                  biphasic  Enlarged artery with  homogenous                                           plaque                            +-----------+--------+-----+--------+----------+---------------------------  ----+  ATA Distal 71                   monophasic                                  +-----------+--------+-----+--------+----------+---------------------------  ----+  PTA Distal 28  biphasic                                    +-----------+--------+-----+--------+----------+---------------------------  ----+  PERO Distal41                   monophasic                                  +-----------+--------+-----+--------+----------+---------------------------  ----+       Right Graft #1:  +------------------+--------+--------+----------+--------------------------  -+                   PSV cm/sStenosisWaveform  Comments                      +------------------+--------+--------+----------+--------------------------  -+  Inflow           52              triphasic dampened, visualized  plaque   +------------------+--------+--------+----------+--------------------------  -+  Prox Anastomosis  39              monophasic                              +------------------+--------+--------+----------+--------------------------  -+  Proximal Graft    54              biphasic  dampened                      +------------------+--------+--------+----------+--------------------------  -+  Mid Graft         71                                                      +------------------+--------+--------+----------+--------------------------  -+  Distal Graft      71              biphasic                                +------------------+--------+--------+----------+--------------------------  -+  Distal Anastomosis167             monophasic                              +------------------+--------+--------+----------+--------------------------  -+  Outflow          161             monophasicdiamater change               +------------------+--------+--------+----------+--------------------------  -+      Summary:  Patent graft with no visualized stenosis. Enlarged distal EIA artery with  visualized plaque.     ASSESSMENT/PLAN:    PAD with history of ischemic right LE with rest pain requiring fem-AT bypass He has developed short distance claudication over the lat year.  He is able to walk about 50 feet before he has to stop and rest.  The right LE claudication is > the left.  He has no history of Left LE symptoms in the past.  The ABI's are unchanged  from 2021.   He was concerned even more when he developed right LE edema 2 weeks ago.  The swelling does go down over night.     The bypass duplex shows decreased flow rates with a drop in inflow from 150 to 52.  This is an indication of pending occlusion.  The duplex demonstrates EXT iliac stenosis.    He will stay as active as he can, elevate for swelling alt. With movement.  I will schedule him  for Aortoangiogram with run off.  He is not on anticoagulation at this time.      Roxy Horseman PA-C [facility] [place] Office: [phone]  MD in clinic [place]

## 2022-12-21 ENCOUNTER — Telehealth: Payer: Self-pay

## 2022-12-21 NOTE — Telephone Encounter (Signed)
I checked again with the patient to advise him that his insurance has not given an approval for his scheduled Angiogram.  The pt verbalized understanding and wants to continue with surgery plans anyway.

## 2022-12-21 NOTE — Telephone Encounter (Signed)
I spoke to pts wife about the pending status for pts Angiogram scheduled for 12/24/22. She verbalized understanding and said the pt needs to have the surgery and she is sure [employer] will pay their part.  Pt will proceed as scheduled.

## 2022-12-24 ENCOUNTER — Encounter (HOSPITAL_COMMUNITY)
Admission: RE | Disposition: A | Discharge status: Home / Self Care | Payer: Self-pay | Source: Home / Self Care | Attending: Vascular Surgery

## 2022-12-24 ENCOUNTER — Other Ambulatory Visit: Payer: Self-pay

## 2022-12-24 ENCOUNTER — Ambulatory Visit (HOSPITAL_COMMUNITY)
Admission: RE | Admit: 2022-12-24 | Discharge: 2022-12-24 | Disposition: A | Discharge status: Home / Self Care | Payer: Medicare PPO | Attending: Vascular Surgery | Admitting: Vascular Surgery

## 2022-12-24 DIAGNOSIS — I70221 Atherosclerosis of native arteries of extremities with rest pain, right leg: Secondary | ICD-10-CM | POA: Diagnosis not present

## 2022-12-24 DIAGNOSIS — M79604 Pain in right leg: Secondary | ICD-10-CM | POA: Diagnosis not present

## 2022-12-24 DIAGNOSIS — I739 Peripheral vascular disease, unspecified: Secondary | ICD-10-CM

## 2022-12-24 HISTORY — PX: ABDOMINAL AORTOGRAM W/LOWER EXTREMITY: CATH118223

## 2022-12-24 LAB — POCT I-STAT, CHEM 8
BUN: 21 mg/dL (ref 8–23)
Calcium, Ion: 1.16 mmol/L (ref 1.15–1.40)
Chloride: 101 mmol/L (ref 98–111)
Creatinine, Ser: 1.2 mg/dL (ref 0.61–1.24)
Glucose, Bld: 107 mg/dL — ABNORMAL HIGH (ref 70–99)
HCT: 44 % (ref 39.0–52.0)
Hemoglobin: 15 g/dL (ref 13.0–17.0)
Potassium: 4.1 mmol/L (ref 3.5–5.1)
Sodium: 139 mmol/L (ref 135–145)
TCO2: 28 mmol/L (ref 22–32)

## 2022-12-24 SURGERY — ABDOMINAL AORTOGRAM W/LOWER EXTREMITY
Anesthesia: LOCAL

## 2022-12-24 MED ORDER — LIDOCAINE HCL (PF) 1 % IJ SOLN
INTRAMUSCULAR | Status: AC
  Filled 2022-12-24: qty 30

## 2022-12-24 MED ORDER — HEPARIN (PORCINE) IN NACL 1000-0.9 UT/500ML-% IV SOLN
INTRAVENOUS | Status: DC | PRN
  Administered 2022-12-24 (×2): 500 mL

## 2022-12-24 MED ORDER — ACETAMINOPHEN 325 MG PO TABS: 650.0000 mg | ORAL_TABLET | ORAL | Status: DC | PRN

## 2022-12-24 MED ORDER — HYDRALAZINE HCL 20 MG/ML IJ SOLN: 5.0000 mg | INTRAMUSCULAR | Status: DC | PRN

## 2022-12-24 MED ORDER — MIDAZOLAM HCL 2 MG/2ML IJ SOLN
INTRAMUSCULAR | Status: DC | PRN
  Administered 2022-12-24: 1 mg via INTRAVENOUS

## 2022-12-24 MED ORDER — LABETALOL HCL 5 MG/ML IV SOLN: 10.0000 mg | INTRAVENOUS | Status: DC | PRN

## 2022-12-24 MED ORDER — ASPIRIN 81 MG PO TBEC: 81.0000 mg | DELAYED_RELEASE_TABLET | Freq: Every day | ORAL | 12 refills | Status: DC

## 2022-12-24 MED ORDER — IODIXANOL 320 MG/ML IV SOLN
INTRAVENOUS | Status: DC | PRN
  Administered 2022-12-24: 153 mL

## 2022-12-24 MED ORDER — SODIUM CHLORIDE 0.9 % IV SOLN: INTRAVENOUS | Status: DC

## 2022-12-24 MED ORDER — SODIUM CHLORIDE 0.9 % IV SOLN: 250.0000 mL | INTRAVENOUS | Status: DC | PRN

## 2022-12-24 MED ORDER — SODIUM CHLORIDE 0.9% FLUSH: 3.0000 mL | Freq: Two times a day (BID) | INTRAVENOUS | Status: DC

## 2022-12-24 MED ORDER — FENTANYL CITRATE (PF) 100 MCG/2ML IJ SOLN
INTRAMUSCULAR | Status: DC | PRN
  Administered 2022-12-24: 50 ug via INTRAVENOUS

## 2022-12-24 MED ORDER — ONDANSETRON HCL 4 MG/2ML IJ SOLN: 4.0000 mg | Freq: Four times a day (QID) | INTRAMUSCULAR | Status: DC | PRN

## 2022-12-24 MED ORDER — ASPIRIN 81 MG PO TBEC: 81.0000 mg | DELAYED_RELEASE_TABLET | Freq: Every day | ORAL | Status: DC

## 2022-12-24 MED ORDER — FENTANYL CITRATE (PF) 100 MCG/2ML IJ SOLN
INTRAMUSCULAR | Status: AC
  Filled 2022-12-24: qty 2

## 2022-12-24 MED ORDER — LIDOCAINE HCL (PF) 1 % IJ SOLN
INTRAMUSCULAR | Status: DC | PRN
  Administered 2022-12-24: 10 mL

## 2022-12-24 MED ORDER — SODIUM CHLORIDE 0.9% FLUSH: 3.0000 mL | INTRAVENOUS | Status: DC | PRN

## 2022-12-24 MED ORDER — MIDAZOLAM HCL 2 MG/2ML IJ SOLN
INTRAMUSCULAR | Status: AC
  Filled 2022-12-24: qty 2

## 2022-12-24 MED ORDER — SODIUM CHLORIDE 0.9 % WEIGHT BASED INFUSION: 1.0000 mL/kg/h | INTRAVENOUS | Status: DC

## 2022-12-24 SURGICAL SUPPLY — 13 items
BAG SNAP BAND KOVER 36X36 (MISCELLANEOUS) IMPLANT
CATH ANGIO 5F PIGTAIL 65CM (CATHETERS) IMPLANT
GUIDEWIRE NITREX 0.018X80X5 (WIRE) IMPLANT
KIT MICROPUNCTURE NIT STIFF (SHEATH) IMPLANT
KIT PV (KITS) ×1 IMPLANT
SHEATH PINNACLE 5F 10CM (SHEATH) IMPLANT
SHEATH PROBE COVER 6X72 (BAG) IMPLANT
STOPCOCK MORSE 400PSI 3WAY (MISCELLANEOUS) IMPLANT
SYR MEDRAD MARK 7 150ML (SYRINGE) ×1 IMPLANT
TRANSDUCER W/STOPCOCK (MISCELLANEOUS) ×1 IMPLANT
TRAY PV CATH (CUSTOM PROCEDURE TRAY) ×1 IMPLANT
TUBING CIL FLEX 10 FLL-RA (TUBING) IMPLANT
WIRE BENTSON .035X145CM (WIRE) IMPLANT

## 2022-12-24 NOTE — OR Nursing (Deleted)
Sheath pull. Right groin. 5FR. Manual pressure held for 25 mins. No complications. No hematoma. Vitals within defined limits. Hemostasis time 1205.

## 2022-12-24 NOTE — OR Nursing (Signed)
Sheath pull. Right groin. 5FR. Manual pressure held for 25 mins. No complications. No hematoma. Vitals within defined limits. Hemostasis time 1005.

## 2022-12-24 NOTE — Progress Notes (Signed)
Pt ambulated to and from bathroom to void with no signs of oozing from bilateral groin sites  

## 2022-12-24 NOTE — Op Note (Signed)
PATIENT: Omar Singh      MRN: [id number] DOB: [birth year]    DATE OF PROCEDURE: 12/24/2022  INDICATIONS:    Omar Singh is a 72 y.o. male who was seen by Lianne Cure, PA in the office with right leg pain.  He was scheduled for an arteriogram.  PROCEDURE:    Conscious sedation Ultrasound-guided access to the right common femoral artery Aortogram with bilateral iliac arteriogram Right lower extremity runoff  SURGEON: Di Kindle. Edilia Bo, MD, FACS  ANESTHESIA: Local with sedation  EBL: Minimal  TECHNIQUE: The patient was brought to the peripheral vascular lab and was sedated. The period of conscious sedation was 56 minutes.  During that time period, I was present face-to-face 100% of the time.  The patient was administered 1 mg of Versed and 50 mcg of fentanyl. The patient's heart rate, blood pressure, and oxygen saturation were monitored by the nurse continuously during the procedure.  Both groins were prepped and draped in the usual sterile fashion.  Under ultrasound guidance, after the skin was anesthetized, I cannulated the left common femoral artery with a micropuncture needle.  Despite multiple attempts I was unable to pass the wire and suspected that this patient had an iliac occlusion on the left.  I therefore went to the right side.  Under ultrasound guidance, after the skin was anesthetized, I cannulated the proximal patch with a micropuncture needle and a micropuncture sheath was introduced over a wire.  This was exchanged for a 5 [place] sheath over a Bentson wire.  By ultrasound the femoral artery was patent. A real-time image was obtained and sent to the server.  A pigtail catheter was positioned at the L1 vertebral body and flush aortogram obtained.  The catheter was in position above the aortic bifurcation and an oblique iliac projection was obtained.  Next I did do a delayed film to try to visualize filling of the occluded iliac system on the left and femoral  artery but there was no visualization.  The pigtail catheter was removed over a wire.  Right lower extremity runoff film was obtained through a retrograde right femoral shot.  At the completion of the procedure, the patient was transferred to the holding area for removal of the sheath.  No immediate complications were noted.  FINDINGS:   There are single renal arteries bilaterally with no significant renal artery stenosis identified.  There is significant calcific plaque in the infrarenal aorta however the aorta is patent without significant stenosis. The left common iliac, external iliac, internal iliac, common femoral, superficial femoral, and deep femoral arteries are all occluded. On the right side the endarterectomy site is patent.  There is no significant inflow disease on the right.  Hypogastric arteries occluded.  The superficial femoral and above-knee popliteal artery are occluded.  The posterior tibial and peroneal arteries are occluded.  The right femoral to anterior tibial artery bypass graft is widely patent.  The distal aspect of the vein graft is slightly smaller but no focal stenosis is identified.  There is dominant runoff via the anterior tibial artery.  The posterior tibial artery does reconstitute distally with poor visualization onto the foot.  CLINICAL NOTE: The patient had no significant problems on the right to explain his right leg pain.  I suspect this is neurogenic pain.  He is currently not having significant symptoms on the left leg.   Waverly Ferrari, MD, FACS [facility]  DATE OF DICTATION:   12/24/2022

## 2022-12-24 NOTE — Interval H&P Note (Signed)
History and Physical Interval Note:  12/24/2022 7:39 AM  Omar Singh  has presented today for surgery, with the diagnosis of PAD.  The various methods of treatment have been discussed with the patient and family. After consideration of risks, benefits and other options for treatment, the patient has consented to  Procedure(s): ABDOMINAL AORTOGRAM W/LOWER EXTREMITY (N/A) as a surgical intervention.  The patient's history has been reviewed, patient examined, no change in status, stable for surgery.  I have reviewed the patient's chart and labs.  Questions were answered to the patient's satisfaction.     Omar Singh

## 2022-12-25 ENCOUNTER — Encounter (HOSPITAL_COMMUNITY): Payer: Self-pay | Admitting: Vascular Surgery

## 2022-12-26 ENCOUNTER — Telehealth: Payer: Self-pay | Admitting: Vascular Surgery

## 2022-12-26 ENCOUNTER — Telehealth: Payer: Self-pay | Admitting: Family Medicine

## 2022-12-26 NOTE — Telephone Encounter (Signed)
-----   Message from Chuck Hint, MD sent at 12/24/2022  9:05 AM EDT ----- Regarding: charge PROCEDURE:   1. Conscious sedation 2. Ultrasound-guided access to the right common femoral artery 3. Aortogram with bilateral iliac arteriogram 4. Right lower extremity runoff  He will need a follow-up visit on the PA schedule in 6 months with ABIs and a right lower extremity graft duplex.  Thank you.  CD

## 2022-12-26 NOTE — Telephone Encounter (Signed)
Copied from CRM [id number]. Topic: Medicare AWV >> Dec 26, 2022 10:33 AM Gwenith Spitz wrote: Reason for CRM: Called patient to schedule Medicare Annual Wellness Visit (AWV). Left message for patient to call back and schedule Medicare Annual Wellness Visit (AWV).  Last date of AWV: 09/24/2019  Please schedule an appointment at any time with Inetta Fermo, [facility]. Please schedule AWVS with Inetta Fermo, [facility].  If any questions, please contact me at [phone].  Thank you ,  Gabriel Cirri [facility] AWV TEAM Direct Dial [phone]

## 2023-01-31 ENCOUNTER — Telehealth: Payer: Self-pay | Admitting: Family Medicine

## 2023-01-31 NOTE — Telephone Encounter (Signed)
Copied from CRM [id number]. Topic: Medicare AWV >> Jan 31, 2023  2:49 PM Gwenith Spitz wrote: Reason for CRM: Called patient to schedule Medicare Annual Wellness Visit (AWV). Left message for patient to call back and schedule Medicare Annual Wellness Visit (AWV).  Last date of AWV: 09/24/2019  Please schedule an appointment at any time with Inetta Fermo, [facility]. Please schedule AWVS with Inetta Fermo, [facility].  If any questions, please contact me at [phone].  Thank you ,  Gabriel Cirri [facility] AWV TEAM Direct Dial [phone]

## 2023-02-04 DIAGNOSIS — L02212 Cutaneous abscess of back [any part, except buttock]: Secondary | ICD-10-CM | POA: Diagnosis not present

## 2023-02-04 DIAGNOSIS — L720 Epidermal cyst: Secondary | ICD-10-CM | POA: Diagnosis not present

## 2023-02-04 DIAGNOSIS — B9561 Methicillin susceptible Staphylococcus aureus infection as the cause of diseases classified elsewhere: Secondary | ICD-10-CM | POA: Diagnosis not present

## 2023-04-23 ENCOUNTER — Telehealth: Payer: Self-pay

## 2023-04-23 ENCOUNTER — Ambulatory Visit (HOSPITAL_COMMUNITY)
Admission: RE | Admit: 2023-04-23 | Discharge: 2023-04-23 | Disposition: A | Discharge status: Home / Self Care | Payer: Medicare PPO | Source: Ambulatory Visit | Attending: Vascular Surgery | Admitting: Vascular Surgery

## 2023-04-23 ENCOUNTER — Other Ambulatory Visit: Payer: Self-pay | Admitting: *Deleted

## 2023-04-23 ENCOUNTER — Ambulatory Visit (INDEPENDENT_AMBULATORY_CARE_PROVIDER_SITE_OTHER)
Admission: RE | Admit: 2023-04-23 | Discharge: 2023-04-23 | Disposition: A | Discharge status: Home / Self Care | Payer: Medicare PPO | Source: Ambulatory Visit | Attending: Vascular Surgery | Admitting: Vascular Surgery

## 2023-04-23 DIAGNOSIS — I70229 Atherosclerosis of native arteries of extremities with rest pain, unspecified extremity: Secondary | ICD-10-CM | POA: Diagnosis not present

## 2023-04-23 DIAGNOSIS — I739 Peripheral vascular disease, unspecified: Secondary | ICD-10-CM

## 2023-04-23 DIAGNOSIS — I70221 Atherosclerosis of native arteries of extremities with rest pain, right leg: Secondary | ICD-10-CM | POA: Diagnosis not present

## 2023-04-23 LAB — VAS US ABI WITH/WO TBI
Left ABI: 0.59
Right ABI: 0.88

## 2023-04-23 NOTE — Telephone Encounter (Signed)
Returned call to pt in regards to - c/o bilateral LE numbness X 2 weeks, pt denied pain, color/temperature changes and ulceration. Pt did did admit to LE being cool to touch but had been that way for year. Office visit scheduled for 8/6, 8/7 to address concerns. Pt was advised that if he experiences severe pain and or his legs/feet began to change color or get cold to call our office and or go to the nearest ED. Pt verbalized understanding and agreed with this plan.

## 2023-04-24 ENCOUNTER — Ambulatory Visit: Payer: Medicare PPO | Admitting: Physician Assistant

## 2023-04-24 VITALS — BP 154/84 | HR 74 | Temp 98.1°F | Ht 72.0 in | Wt 206.0 lb

## 2023-04-24 DIAGNOSIS — I739 Peripheral vascular disease, unspecified: Secondary | ICD-10-CM | POA: Diagnosis not present

## 2023-04-24 NOTE — Progress Notes (Signed)
Office Note     CC:  follow up Requesting Provider:  Ardith Dark, MD  HPI: Omar Singh is a 72 y.o. (04/20/1951) male who was added on as a triage urgent visit for today's clinic.  He is experiencing numbness in his right foot which has been occurring over the past year however has been more apparent in the last few weeks.  Numbness mainly occurs when laying down at night.  He denies any claudication, rest pain, or tissue loss of bilateral lower extremities.  Surgical history is significant for extensive right iliofemoral endarterectomy as well as femoral to anterior tibial artery bypass with vein.  He admittedly is not very active.  He has edema of bilateral lower extremities however this is normal for him.  He is a former smoker.  He is on aspirin and statin daily.  He also has a known left common iliac, external iliac, common femoral, SFA, and deep femoral artery occlusion.   Past Medical History:  Diagnosis Date   Acute exacerbation of chronic obstructive pulmonary disease (COPD) (HCC)    /notes 03/23/2015   Alcohol abuse    CAD (coronary artery disease)    a. s/p multi-link vision stent to the mid RCA in 2007 at [facility].   Hyperlipidemia    Hypertension    Myocardial infarction [facility]) 2008   /notes 03/23/2015   PAD (peripheral artery disease) (HCC)    Panic attack    Tobacco abuse     Past Surgical History:  Procedure Laterality Date   ABDOMINAL AORTOGRAM W/LOWER EXTREMITY N/A 12/24/2022   Procedure: ABDOMINAL AORTOGRAM W/LOWER EXTREMITY;  Surgeon: Chuck Hint, MD;  Location: [facility] INVASIVE CV LAB;  Service: Cardiovascular;  Laterality: N/A;   CARDIAC CATHETERIZATION N/A 03/25/2015   Procedure: Left Heart Cath and Coronary Angiography;  Surgeon: Kathleene Hazel, MD;  Location: [facility] INVASIVE CV LAB;  Service: Cardiovascular;  Laterality: N/A;   CARDIAC CATHETERIZATION N/A 03/25/2015   Procedure: Coronary Stent Intervention;  Surgeon: Kathleene Hazel, MD;   Location: [facility] INVASIVE CV LAB;  Service: Cardiovascular;  Laterality: N/A;   CORONARY ANGIOPLASTY WITH STENT PLACEMENT  2008   "1"   FEMORAL-TIBIAL BYPASS GRAFT Right 01/08/2019   Procedure: RIGHT FEMORAL Endartarectomy and profundoplasty using greater saphenous vein graft. Right anterior tibial artery bypass with nonreversed saphenous vein graft;  Surgeon: Chuck Hint, MD;  Location: [facility] OR;  Service: Vascular;  Laterality: Right;   LOWER EXTREMITY ANGIOGRAM Right 01/08/2019   Procedure: Lower Extremity Angiogram;  Surgeon: Chuck Hint, MD;  Location: [facility] OR;  Service: Vascular;  Laterality: Right;   LOWER EXTREMITY ANGIOGRAPHY N/A 01/06/2019   Procedure: LOWER EXTREMITY ANGIOGRAPHY;  Surgeon: Nada Libman, MD;  Location: [facility] INVASIVE CV LAB;  Service: Cardiovascular;  Laterality: N/A;   TONSILLECTOMY      Social History   Socioeconomic History   Marital status: Married    Spouse name: Not on file   Number of children: Not on file   Years of education: Not on file   Highest education level: Not on file  Occupational History   Occupation: Retired     Comment: auto-mechanic   Tobacco Use   Smoking status: Former    Current packs/day: 0.00    Average packs/day: 0.3 packs/day for 47.0 years (11.8 ttl pk-yrs)    Types: Cigarettes    Start date: 05/18/1972    Quit date: 05/19/2019    Years since quitting: 3.9   Smokeless tobacco: Never  Vaping Use   Vaping status: Never Used  Substance and Sexual Activity   Alcohol use: Not Currently    Alcohol/week: 56.0 standard drinks of alcohol    Types: 56 Cans of beer per week    Comment: quit 1 year ago    Drug use: No   Sexual activity: Not Currently  Other Topics Concern   Not on file  Social History Narrative   Not on file   Social Determinants of Health   Financial Resource Strain: Not on file  Food Insecurity: Not on file  Transportation Needs: Not on file  Physical Activity: Not on file  Stress: Not on file   Social Connections: Not on file  Intimate Partner Violence: Not on file    Family History  Problem Relation Age of Onset   CVA Other        Grandparents had strokes   Cancer Neg Hx     Current Outpatient Medications  Medication Sig Dispense Refill   amLODipine (NORVASC) 10 MG tablet Take 1 tablet (10 mg total) by mouth daily. 90 tablet 3   aspirin EC 81 MG tablet Take 1 tablet (81 mg total) by mouth daily. Swallow whole. 30 tablet 12   atorvastatin (LIPITOR) 40 MG tablet Take 1 tablet (40 mg total) by mouth daily. 90 tablet 3   carvedilol (COREG) 6.25 MG tablet TAKE 1 TABLET BY MOUTH 2 TIMES DAILY WITH A MEAL. 180 tablet 1   No current facility-administered medications for this visit.    No Known Allergies   REVIEW OF SYSTEMS:   [X]  denotes positive finding, [ ]  denotes negative finding Cardiac  Comments:  Chest pain or chest pressure:    Shortness of breath upon exertion:    Short of breath when lying flat:    Irregular heart rhythm:        Vascular    Pain in calf, thigh, or hip brought on by ambulation:    Pain in feet at night that wakes you up from your sleep:     Blood clot in your veins:    Leg swelling:         Pulmonary    Oxygen at home:    Productive cough:     Wheezing:         Neurologic    Sudden weakness in arms or legs:     Sudden numbness in arms or legs:     Sudden onset of difficulty speaking or slurred speech:    Temporary loss of vision in one eye:     Problems with dizziness:         Gastrointestinal    Blood in stool:     Vomited blood:         Genitourinary    Burning when urinating:     Blood in urine:        Psychiatric    Major depression:         Hematologic    Bleeding problems:    Problems with blood clotting too easily:        Skin    Rashes or ulcers:        Constitutional    Fever or chills:      PHYSICAL EXAMINATION:  Vitals:   04/24/23 0905  BP: (!) 154/84  Pulse: 74  Temp: 98.1 F (36.7 C)  TempSrc:  Temporal  SpO2: 91%  Weight: 206 lb (93.4 kg)  Height: 6' (1.829 m)    General:  WDWN in NAD; vital signs documented above Gait: Not observed HENT: WNL, normocephalic Pulmonary: normal non-labored breathing , without Rales, rhonchi,  wheezing Cardiac: regular HR Abdomen: soft, NT, no masses Skin: without rashes Vascular Exam/Pulses: 2+ R DP pulse; soft L PT by doppler Extremities: without ischemic changes, without Gangrene , without cellulitis; without open wounds;  Musculoskeletal: no muscle wasting or atrophy  Neurologic: A&O X 3 Psychiatric:  The pt has Normal affect.   Non-Invasive Vascular Imaging:   Right leg bypass patent despite velocities in the 30s  ABI/TBIToday's ABIToday's TBIPrevious ABIPrevious TBI  +-------+-----------+-----------+------------+------------+  Right 0.88       0.72       0.99        0.73          +-------+-----------+-----------+------------+------------+  Left  0.59       0.4        0.67        0.47             ASSESSMENT/PLAN:: 72 y.o. male here for evaluation of right foot numbness which has been present for the past year however has become more apparent in the last 2 to 3 weeks  -Mr. Nickan Theodorou is a 72 year old male with history of right iliofemoral endarterectomy and femoral to anterior tibial artery bypass graft with vein.  He has had numbness in his right foot over the past year which has become more apparent in the past several weeks.  He underwent angiography in April of this year due to low flow velocities in the bypass graft of the right leg.  Angiogram was diagnostic which demonstrated a widely patent bypass graft.  He again has a patent bypass graft with velocities in the 30s on duplex.  He has a 2+ palpable DP pulse.  He states the numbness in his right foot is mild and tolerable.  Given that this only occurs when he is laying down at night, this may suggest lumbar spine stenosis with radiculopathy.  At any rate we will  continue his aspirin and statin and surveillance of right leg bypass.  We will repeat imaging studies in 3 months.  Despite left iliac, common femoral, profunda, and SFA occlusion he is asymptomatic.  No indication for revascularization of left lower extremity currently.  He knows to call/return office sooner with any questions or concerns or worsening symptoms.   Emilie Rutter, PA-C [facility] [phone]  Clinic MD:   Edilia Bo

## 2023-05-01 ENCOUNTER — Other Ambulatory Visit: Payer: Self-pay

## 2023-05-01 DIAGNOSIS — I739 Peripheral vascular disease, unspecified: Secondary | ICD-10-CM

## 2023-06-14 ENCOUNTER — Other Ambulatory Visit: Payer: Self-pay | Admitting: Family Medicine

## 2023-06-18 ENCOUNTER — Other Ambulatory Visit: Payer: Self-pay | Admitting: Family Medicine

## 2023-07-26 ENCOUNTER — Ambulatory Visit: Payer: Medicare PPO | Admitting: Physician Assistant

## 2023-07-26 ENCOUNTER — Ambulatory Visit (INDEPENDENT_AMBULATORY_CARE_PROVIDER_SITE_OTHER)
Admission: RE | Admit: 2023-07-26 | Discharge: 2023-07-26 | Disposition: A | Discharge status: Home / Self Care | Payer: Medicare PPO | Source: Ambulatory Visit | Attending: Vascular Surgery

## 2023-07-26 ENCOUNTER — Ambulatory Visit (HOSPITAL_COMMUNITY)
Admission: RE | Admit: 2023-07-26 | Discharge: 2023-07-26 | Disposition: A | Discharge status: Home / Self Care | Payer: Medicare PPO | Source: Ambulatory Visit | Attending: Vascular Surgery | Admitting: Vascular Surgery

## 2023-07-26 VITALS — BP 165/81 | HR 69 | Temp 98.3°F | Ht 72.0 in | Wt 201.2 lb

## 2023-07-26 DIAGNOSIS — I739 Peripheral vascular disease, unspecified: Secondary | ICD-10-CM

## 2023-07-26 DIAGNOSIS — M7989 Other specified soft tissue disorders: Secondary | ICD-10-CM

## 2023-07-26 LAB — VAS US ABI WITH/WO TBI
Left ABI: 0.62
Right ABI: 0.97

## 2023-07-26 NOTE — Progress Notes (Signed)
Office Note     CC:  follow up Requesting Provider:  Ardith Dark, MD  HPI: Omar Singh is a 72 y.o. (1951/03/24) male who presents for surveillance of PAD with history of extensive right iliofemoral endarterectomy with femoral to anterior tibial artery bypass graft with vein by Dr. Edilia Bo on 01/08/2019.  He underwent angiography due to low flow velocities in his bypass graft in April of this year however this was diagnostic in nature demonstrating a widely patent bypass graft.  He has numbness in both legs however this is chronic.  Numbness is worse when laying flat.  He is unaware of any lumbar spine pathology.  He is complaining of swelling in his right more so than his left leg which has been starting to affect his day-to-day life.  He wears knee-high compression regularly however is not satisfied with the results.  He has no history of DVT, venous ulcerations or trauma.  He is a former smoker.  He takes a daily aspirin and statin.  He also has a known occluded left common iliac, external iliac, internal iliac, common femoral, profunda and SFA.  He claudicate in his left calf however is without rest pain or tissue loss in his foot.   Past Medical History:  Diagnosis Date   Acute exacerbation of chronic obstructive pulmonary disease (COPD) (HCC)    /notes 03/23/2015   Alcohol abuse    CAD (coronary artery disease)    a. s/p multi-link vision stent to the mid RCA in 2007 at [facility].   Hyperlipidemia    Hypertension    Myocardial infarction [facility]) 2008   /notes 03/23/2015   PAD (peripheral artery disease) (HCC)    Panic attack    Tobacco abuse     Past Surgical History:  Procedure Laterality Date   ABDOMINAL AORTOGRAM W/LOWER EXTREMITY N/A 12/24/2022   Procedure: ABDOMINAL AORTOGRAM W/LOWER EXTREMITY;  Surgeon: Chuck Hint, MD;  Location: [facility] INVASIVE CV LAB;  Service: Cardiovascular;  Laterality: N/A;   CARDIAC CATHETERIZATION N/A 03/25/2015   Procedure: Left Heart Cath  and Coronary Angiography;  Surgeon: Kathleene Hazel, MD;  Location: [facility] INVASIVE CV LAB;  Service: Cardiovascular;  Laterality: N/A;   CARDIAC CATHETERIZATION N/A 03/25/2015   Procedure: Coronary Stent Intervention;  Surgeon: Kathleene Hazel, MD;  Location: [facility] INVASIVE CV LAB;  Service: Cardiovascular;  Laterality: N/A;   CORONARY ANGIOPLASTY WITH STENT PLACEMENT  2008   "1"   FEMORAL-TIBIAL BYPASS GRAFT Right 01/08/2019   Procedure: RIGHT FEMORAL Endartarectomy and profundoplasty using greater saphenous vein graft. Right anterior tibial artery bypass with nonreversed saphenous vein graft;  Surgeon: Chuck Hint, MD;  Location: [facility] OR;  Service: Vascular;  Laterality: Right;   LOWER EXTREMITY ANGIOGRAM Right 01/08/2019   Procedure: Lower Extremity Angiogram;  Surgeon: Chuck Hint, MD;  Location: [facility] OR;  Service: Vascular;  Laterality: Right;   LOWER EXTREMITY ANGIOGRAPHY N/A 01/06/2019   Procedure: LOWER EXTREMITY ANGIOGRAPHY;  Surgeon: Nada Libman, MD;  Location: [facility] INVASIVE CV LAB;  Service: Cardiovascular;  Laterality: N/A;   TONSILLECTOMY      Social History   Socioeconomic History   Marital status: Married    Spouse name: Not on file   Number of children: Not on file   Years of education: Not on file   Highest education level: Not on file  Occupational History   Occupation: Retired     Comment: auto-mechanic   Tobacco Use   Smoking status: Former  Current packs/day: 0.00    Average packs/day: 0.3 packs/day for 47.0 years (11.8 ttl pk-yrs)    Types: Cigarettes    Start date: 05/18/1972    Quit date: 05/19/2019    Years since quitting: 4.1   Smokeless tobacco: Never  Vaping Use   Vaping status: Never Used  Substance and Sexual Activity   Alcohol use: Not Currently    Alcohol/week: 56.0 standard drinks of alcohol    Types: 56 Cans of beer per week    Comment: quit 1 year ago    Drug use: No   Sexual activity: Not Currently  Other Topics  Concern   Not on file  Social History Narrative   Not on file   Social Determinants of Health   Financial Resource Strain: Not on file  Food Insecurity: Not on file  Transportation Needs: Not on file  Physical Activity: Not on file  Stress: Not on file  Social Connections: Not on file  Intimate Partner Violence: Not on file    Family History  Problem Relation Age of Onset   CVA Other        Grandparents had strokes   Cancer Neg Hx     Current Outpatient Medications  Medication Sig Dispense Refill   amLODipine (NORVASC) 10 MG tablet TAKE 1 TABLET BY MOUTH EVERY DAY 90 tablet 3   aspirin EC 81 MG tablet Take 1 tablet (81 mg total) by mouth daily. Swallow whole. 30 tablet 12   atorvastatin (LIPITOR) 40 MG tablet Take 1 tablet (40 mg total) by mouth daily. 90 tablet 3   carvedilol (COREG) 6.25 MG tablet TAKE 1 TABLET BY MOUTH TWICE A DAY WITH FOOD 180 tablet 1   No current facility-administered medications for this visit.    No Known Allergies   REVIEW OF SYSTEMS:   [X]  denotes positive finding, [ ]  denotes negative finding Cardiac  Comments:  Chest pain or chest pressure:    Shortness of breath upon exertion:    Short of breath when lying flat:    Irregular heart rhythm:        Vascular    Pain in calf, thigh, or hip brought on by ambulation:    Pain in feet at night that wakes you up from your sleep:     Blood clot in your veins:    Leg swelling:         Pulmonary    Oxygen at home:    Productive cough:     Wheezing:         Neurologic    Sudden weakness in arms or legs:     Sudden numbness in arms or legs:     Sudden onset of difficulty speaking or slurred speech:    Temporary loss of vision in one eye:     Problems with dizziness:         Gastrointestinal    Blood in stool:     Vomited blood:         Genitourinary    Burning when urinating:     Blood in urine:        Psychiatric    Major depression:         Hematologic    Bleeding problems:     Problems with blood clotting too easily:        Skin    Rashes or ulcers:        Constitutional    Fever or chills:  PHYSICAL EXAMINATION:  Vitals:   07/26/23 1027  BP: (!) 165/81  Pulse: 69  Temp: 98.3 F (36.8 C)  SpO2: 91%  Weight: 201 lb 3.2 oz (91.3 kg)  Height: 6' (1.829 m)    General:  WDWN in NAD; vital signs documented above Gait: Not observed HENT: WNL, normocephalic Pulmonary: normal non-labored breathing , without Rales, rhonchi,  wheezing Cardiac: regular HR Abdomen: soft, NT, no masses Skin: without rashes Vascular Exam/Pulses: 2+ palpable right DP pulse; absent left pedal pulses Extremities: without ischemic changes, without Gangrene , without cellulitis; without open wounds;  Musculoskeletal: no muscle wasting or atrophy  Neurologic: A&O X 3 Psychiatric:  The pt has Normal affect.   Non-Invasive Vascular Imaging:   Low flow velocities again noted in proximal and mid bypass graft however triphasic flow throughout  ABI/TBIToday's ABIToday's TBIPrevious ABIPrevious TBI  +-------+-----------+-----------+------------+------------+  Right 0.97       0.71       0.88        0.72          +-------+-----------+-----------+------------+------------+  Left  0.62       0.41       0.59        0.40          +-------+-----------+-----------+------------+--------     ASSESSMENT/PLAN:: 72 y.o. male here for surveillance of right leg bypass  Mr. Atley Payant is a 72 year old male with history of right iliofemoral endarterectomy and femoral to anterior tibial artery bypass.  He underwent angiography in April of this year due to low flow velocities through the bypass.  Angiography was diagnostic in nature demonstrating a widely patent bypass.  Duplex continues to show low flow velocities throughout the bypass however he does still have a palpable DP pulse and multiphasic flow throughout the bypass graft.  We will continue surveillance with a  41-month right lower extremity bypass duplex and ABI.  He has a known left common iliac, external iliac, common femoral, profunda, and SFA occlusion however is without rest pain or tissue loss.  He does claudicate in his left calf however he states symptoms are currently tolerable.  He complains of numbness in both legs made worse with laying flat on his back.  We discussed the possibility of lumbar spine stenosis however due to the mild nature of symptoms he has no interest in pursuing further workup.  He is however becoming more bothered by his right lower extremity edema.  Right greater saphenous vein was harvested for his bypass in 2020.  Patient states he wears knee-high compression on a daily basis.  He was measured for and prescribed thigh-high compression during today's visit.  He will also make a better effort to elevate his legs above the level of his heart as well as avoiding prolonged sitting and standing.  I asked him to increase his activity level to help promote venous return.  I will also refer him back to his PCP to discuss the utility of adding a diuretic to his medications. Greater than 30 minutes were spent during today's office visit.  Emilie Rutter, PA-C [facility] [phone]  Clinic MD:   Hetty Blend

## 2023-08-09 ENCOUNTER — Other Ambulatory Visit: Payer: Self-pay

## 2023-08-09 DIAGNOSIS — I739 Peripheral vascular disease, unspecified: Secondary | ICD-10-CM

## 2023-09-11 ENCOUNTER — Other Ambulatory Visit: Payer: Self-pay | Admitting: Family Medicine

## 2023-10-09 DIAGNOSIS — H6123 Impacted cerumen, bilateral: Secondary | ICD-10-CM | POA: Diagnosis not present

## 2023-11-26 ENCOUNTER — Ambulatory Visit: Payer: Self-pay | Admitting: Family Medicine

## 2023-11-26 NOTE — Telephone Encounter (Signed)
  Chief Complaint: difficulty breathing Symptoms: SOB Frequency: 1 week Pertinent Negatives: Patient denies fever, URI, CP, wheezing Disposition: [] ED /[x] Urgent Care (no appt availability in office) / [] Appointment(In office/virtual)/ []  [facility]/ [] Home Care/ [x] Refused Recommended Disposition /[] [facility]/ []  Follow-up with PCP Additional Notes: Pt c/o difficulty breathing x 1 week. Denies any sick contacts, fevers, CP, wheezing. Endorses SOB with exertion and occasionally at rest. Unsure of COPD dx and he does not have a pulmonologist or has been tx for that dx. Patient advised on protocol disposition, but refused. Patient prefers to speak with PCP. Triager forwarded encounter to PCP for review. Patient verbalized understanding and to go to ED/calol 911 with worsening symptoms.     Copied from CRM [id number]. Topic: Clinical - Red Word Triage >> Nov 26, 2023  4:59 PM Taleah C wrote: Red Word that prompted transfer to Nurse Triage: difficulty breathing, sxs for 1 week. Reason for Disposition  [1] MILD difficulty breathing (e.g., minimal/no SOB at rest, SOB with walking, pulse <100) AND [2] NEW-onset or WORSE than normal  Answer Assessment - Initial Assessment Questions 1. RESPIRATORY STATUS: "Describe your breathing?" (e.g., wheezing, shortness of breath, unable to speak, severe coughing)      Difficulty breathing - "hard to breath" Denies wheezing, coughing 2. ONSET: "When did this breathing problem begin?"      X 1 week 3. PATTERN "Does the difficult breathing come and go, or has it been constant since it started?"      Comes and goes 4. SEVERITY: "How bad is your breathing?" (e.g., mild, moderate, severe)    - MILD: No SOB at rest, mild SOB with walking, speaks normally in sentences, can lie down, no retractions, pulse < 100.    - MODERATE: SOB at rest, SOB with minimal exertion and prefers to sit, cannot lie down flat, speaks in phrases, mild retractions,  audible wheezing, pulse 100-120.    - SEVERE: Very SOB at rest, speaks in single words, struggling to breathe, sitting hunched forward, retractions, pulse > 120      SOB with exertion - mild - mod 5. RECURRENT SYMPTOM: "Have you had difficulty breathing before?" If Yes, ask: "When was the last time?" and "What happened that time?"      no 6. CARDIAC HISTORY: "Do you have any history of heart disease?" (e.g., heart attack, angina, bypass surgery, angioplasty)      Denies CHF, endorses 2 stents 7. LUNG HISTORY: "Do you have any history of lung disease?"  (e.g., pulmonary embolus, asthma, emphysema)     COPD, but does not see pulmonologist or has had tx for it 8. CAUSE: "What do you think is causing the breathing problem?"      I dont know 9. OTHER SYMPTOMS: "Do you have any other symptoms? (e.g., dizziness, runny nose, cough, chest pain, fever)     Endorses runny nose - 3 days ago Denies cough, fever, CP  12. TRAVEL: "Have you traveled out of the country in the last month?" (e.g., travel history, exposures)       No travel, no sick exposures  Protocols used: Breathing Difficulty-A-AH

## 2023-11-27 NOTE — Telephone Encounter (Signed)
 Agree with dispo. Needs to be seen soon either here or at urgent care.  Katina Degree. Jimmey Ralph, MD 11/27/2023 9:41 AM

## 2023-11-27 NOTE — Telephone Encounter (Signed)
 Please contact patient and schedule an appointment in office per PCP

## 2023-11-28 ENCOUNTER — Ambulatory Visit (INDEPENDENT_AMBULATORY_CARE_PROVIDER_SITE_OTHER): Admitting: Family Medicine

## 2023-11-28 ENCOUNTER — Encounter: Payer: Self-pay | Admitting: Family Medicine

## 2023-11-28 ENCOUNTER — Ambulatory Visit

## 2023-11-28 VITALS — BP 132/78 | HR 67 | Temp 97.2°F | Ht 72.0 in | Wt 200.0 lb

## 2023-11-28 DIAGNOSIS — J449 Chronic obstructive pulmonary disease, unspecified: Secondary | ICD-10-CM

## 2023-11-28 DIAGNOSIS — R0602 Shortness of breath: Secondary | ICD-10-CM

## 2023-11-28 DIAGNOSIS — I10 Essential (primary) hypertension: Secondary | ICD-10-CM

## 2023-11-28 DIAGNOSIS — I480 Paroxysmal atrial fibrillation: Secondary | ICD-10-CM

## 2023-11-28 DIAGNOSIS — I2510 Atherosclerotic heart disease of native coronary artery without angina pectoris: Secondary | ICD-10-CM

## 2023-11-28 DIAGNOSIS — Z9861 Coronary angioplasty status: Secondary | ICD-10-CM

## 2023-11-28 DIAGNOSIS — J439 Emphysema, unspecified: Secondary | ICD-10-CM | POA: Diagnosis not present

## 2023-11-28 LAB — CBC WITH DIFFERENTIAL/PLATELET
Basophils Absolute: 0.1 10*3/uL (ref 0.0–0.1)
Basophils Relative: 2 % (ref 0.0–3.0)
Eosinophils Absolute: 0.1 10*3/uL (ref 0.0–0.7)
Eosinophils Relative: 2 % (ref 0.0–5.0)
HCT: 45.4 % (ref 39.0–52.0)
Hemoglobin: 14.8 g/dL (ref 13.0–17.0)
Lymphocytes Relative: 16.5 % (ref 12.0–46.0)
Lymphs Abs: 1.1 10*3/uL (ref 0.7–4.0)
MCHC: 32.5 g/dL (ref 30.0–36.0)
MCV: 101.1 fl — ABNORMAL HIGH (ref 78.0–100.0)
Monocytes Absolute: 0.6 10*3/uL (ref 0.1–1.0)
Monocytes Relative: 9.3 % (ref 3.0–12.0)
Neutro Abs: 4.6 10*3/uL (ref 1.4–7.7)
Neutrophils Relative %: 70.2 % (ref 43.0–77.0)
Platelets: 234 10*3/uL (ref 150.0–400.0)
RBC: 4.49 Mil/uL (ref 4.22–5.81)
RDW: 13.5 % (ref 11.5–15.5)
WBC: 6.5 10*3/uL (ref 4.0–10.5)

## 2023-11-28 LAB — COMPREHENSIVE METABOLIC PANEL
ALT: 15 U/L (ref 0–53)
AST: 18 U/L (ref 0–37)
Albumin: 4.3 g/dL (ref 3.5–5.2)
Alkaline Phosphatase: 50 U/L (ref 39–117)
BUN: 19 mg/dL (ref 6–23)
CO2: 31 meq/L (ref 19–32)
Calcium: 9.3 mg/dL (ref 8.4–10.5)
Chloride: 100 meq/L (ref 96–112)
Creatinine, Ser: 1.12 mg/dL (ref 0.40–1.50)
GFR: 65.99 mL/min (ref >60.00)
Glucose, Bld: 108 mg/dL — ABNORMAL HIGH (ref 70–99)
Potassium: 4.3 meq/L (ref 3.5–5.1)
Sodium: 139 meq/L (ref 135–145)
Total Bilirubin: 0.7 mg/dL (ref 0.2–1.2)
Total Protein: 7.1 g/dL (ref 6.0–8.3)

## 2023-11-28 LAB — HEMOGLOBIN A1C: Hgb A1c MFr Bld: 6.4 % (ref 4.6–6.5)

## 2023-11-28 LAB — TSH: TSH: 0.78 u[IU]/mL (ref 0.35–5.50)

## 2023-11-28 LAB — BRAIN NATRIURETIC PEPTIDE: Pro B Natriuretic peptide (BNP): 78 pg/mL (ref 0.0–100.0)

## 2023-11-28 MED ORDER — ALBUTEROL SULFATE HFA 108 (90 BASE) MCG/ACT IN AERS: 2.0000 | INHALATION_SPRAY | Freq: Four times a day (QID) | RESPIRATORY_TRACT | 0 refills | Status: DC | PRN

## 2023-11-28 NOTE — Assessment & Plan Note (Signed)
 Overall symptoms are stable.  He has having more shortness of breath as above but no other signs of COPD flare on exam today.  Will check x-ray.  Will give albuterol inhaler that he can use at home as well.  Depending on results of above workup may need referral to pulmonology.

## 2023-11-28 NOTE — Progress Notes (Signed)
 Omar Singh is a 73 y.o. male who presents today for an office visit.  Assessment/Plan:  New/Acute Problems: Shortness of Breath Overall reassuring exam significant abnormal findings.  No signs of respiratory distress.  He is satting at 90% on room air however otherwise vital signs are within goal.  EKG today shows normal sinus rhythm without any ischemic changes.  Unclear etiology at this point however broad differential does include COPD flare, CHF though no prior diagnosis of this, PULMONARY EMBOLISM, anginal equivalent, etc.  He has several comorbidities which may be contributing including history of atrial fibrillation, coronary artery disease and COPD.  Given that he appears nontoxic on exam and has no signs of respiratory distress, do not think he needs to go to the emergency room at this point.  Will check stat chest x-ray as well as labs including CBC, c-Met, TSH, BNP, and D-dimer.  Will also give an albuterol inhaler for his underlying COPD though do not think this is the main contributor at this point.  We discussed reasons to return to care and seek emergent care.  Depending on above workup may need referral to see cardiology or pulmonology.  Chronic Problems Addressed Today: COPD (chronic obstructive pulmonary disease) (HCC) Overall symptoms are stable.  He has having more shortness of breath as above but no other signs of COPD flare on exam today.  Will check x-ray.  Will give albuterol inhaler that he can use at home as well.  Depending on results of above workup may need referral to pulmonology.  PAF (paroxysmal atrial fibrillation) Regular rate and rhythm today.  EKG shows normal sinus rhythm - do not think this is contributing to his shortness of breath.  Essential hypertension Initially elevated however at goal on recheck.  Continue amlodipine 10 mg daily and Coreg 6.25 mg twice daily.  Check labs.     Subjective:  HPI:  Patient here with 1 week of shortness of  breath.  Symptoms are intermittent nature.  They can happen at rest or with exertion.  He has not had any significant change in symptoms the last few days.  No associated cough or wheeze.  No recent illnesses.  No fevers or chills.  No chest pain.  No obvious aggravating or alleviating factors.  No reported leg swelling.  No reported orthopnea.  No specific treatments tried.         Objective:  Physical Exam: BP 132/78   Pulse 67   Temp (!) 97.2 F (36.2 C) (Temporal)   Ht 6' (1.829 m)   Wt 200 lb (90.7 kg)   SpO2 90%   BMI 27.12 kg/m   Wt Readings from Last 3 Encounters:  11/28/23 200 lb (90.7 kg)  07/26/23 201 lb 3.2 oz (91.3 kg)  04/24/23 206 lb (93.4 kg)    Gen: No acute distress, resting comfortably CV: Regular rate and rhythm with no murmurs appreciated Pulm: Normal work of breathing, clear to auscultation bilaterally with no crackles, wheezes, or rhonchi.  Speaking in full sentences. Neuro: Grossly normal, moves all extremities Psych: Normal affect and thought content  EKG: NSR.  Ventricular rate 60.  Right bundle branch block.  No ischemic changes.  Time Spent: 45 minutes of total time was spent on the date of the encounter performing the following actions: chart review prior to seeing the patient including recent visits with specialists, obtaining history, performing a medically necessary exam, counseling on the treatment plan, placing orders, and documenting in our EHR.  This  does not include time spent interpreting EKG.       Katina Degree. Jimmey Ralph, MD 11/28/2023 11:54 AM

## 2023-11-28 NOTE — Assessment & Plan Note (Signed)
 Initially elevated however at goal on recheck.  Continue amlodipine 10 mg daily and Coreg 6.25 mg twice daily.  Check labs.

## 2023-11-28 NOTE — Assessment & Plan Note (Signed)
 Regular rate and rhythm today.  EKG shows normal sinus rhythm - do not think this is contributing to his shortness of breath.

## 2023-11-28 NOTE — Patient Instructions (Signed)
 It was very nice to see you today!  Your EKG today is normal.  We will check an x-ray and blood work.  Please go to the ED if you have any worsening symptoms.  Will contact you as we get results back.  Return if symptoms worsen or fail to improve.   Take care, Dr Jimmey Ralph  PLEASE NOTE:  If you had any lab tests, please let us know if you have not heard back within a few days. You may see your results on mychart before we have a chance to review them but we will give you a call once they are reviewed by [place].   If we ordered any referrals today, please let us know if you have not heard from their office within the next week.   If you had any urgent prescriptions sent in today, please check with the pharmacy within an hour of our visit to make sure the prescription was transmitted appropriately.   Please try these tips to maintain a healthy lifestyle:  Eat at least 3 REAL meals and 1-2 snacks per day.  Aim for no more than 5 hours between eating.  If you eat breakfast, please do so within one hour of getting up.   Each meal should contain half fruits/vegetables, one quarter protein, and one quarter carbs (no bigger than a computer mouse)  Cut down on sweet beverages. This includes juice, soda, and sweet tea.   Drink at least 1 glass of water with each meal and aim for at least 8 glasses per day  Exercise at least 150 minutes every week.

## 2023-11-29 ENCOUNTER — Ambulatory Visit (HOSPITAL_COMMUNITY)
Admission: RE | Admit: 2023-11-29 | Discharge: 2023-11-29 | Disposition: A | Discharge status: Home / Self Care | Source: Ambulatory Visit | Attending: Family Medicine | Admitting: Family Medicine

## 2023-11-29 ENCOUNTER — Other Ambulatory Visit: Payer: Self-pay | Admitting: Family Medicine

## 2023-11-29 DIAGNOSIS — J439 Emphysema, unspecified: Secondary | ICD-10-CM | POA: Diagnosis not present

## 2023-11-29 DIAGNOSIS — I700 Atherosclerosis of aorta: Secondary | ICD-10-CM | POA: Diagnosis not present

## 2023-11-29 DIAGNOSIS — R0602 Shortness of breath: Secondary | ICD-10-CM

## 2023-11-29 DIAGNOSIS — I719 Aortic aneurysm of unspecified site, without rupture: Secondary | ICD-10-CM | POA: Diagnosis not present

## 2023-11-29 LAB — D-DIMER, QUANTITATIVE: D-Dimer, Quant: 1.26 ug{FEU}/mL — ABNORMAL HIGH (ref <0.50)

## 2023-11-29 MED ORDER — IOHEXOL 350 MG/ML SOLN
75.0000 mL | Freq: Once | INTRAVENOUS | Status: AC | PRN
  Administered 2023-11-29: 75 mL via INTRAVENOUS

## 2023-11-29 NOTE — Progress Notes (Signed)
 His D-dimer is elevated.  This indicates that he may have a blood clot.  We need to check a CT scan to confirm this.  I will place a stat order for CTA chest.  The rest of his labs are all stable.  His sugar is elevated into the prediabetic range.  We can discuss this more at his upcoming physical but he should continue to work on reducing sugar intake and regular exercise.

## 2023-12-02 NOTE — Progress Notes (Signed)
 Chest x-ray shows emphysema.  No other acute abnormalities.  Please see his CTA chest result note.

## 2023-12-02 NOTE — Progress Notes (Signed)
 Good news!  Your CT scan did not show any blood clots.  It did show a small dilation of his aorta.  This is not an urgent concern but does need to be followed annually.  Recommend he discuss this with cardiology.  His CT scan also did show emphysema.  I would like for him to let us know if his shortness of breath is not improving with albuterol inhaler we sent in.  If still not improving recommend we refer him to pulmonology.  He should let us know if he has had any worsening symptoms.

## 2023-12-11 ENCOUNTER — Other Ambulatory Visit: Payer: Self-pay | Admitting: Family Medicine

## 2023-12-20 ENCOUNTER — Other Ambulatory Visit: Payer: Self-pay | Admitting: Family Medicine

## 2024-01-16 ENCOUNTER — Other Ambulatory Visit: Payer: Self-pay | Admitting: Family Medicine

## 2024-01-23 ENCOUNTER — Ambulatory Visit (HOSPITAL_COMMUNITY): Payer: Medicare PPO

## 2024-01-23 ENCOUNTER — Ambulatory Visit: Payer: Medicare PPO

## 2024-01-24 ENCOUNTER — Other Ambulatory Visit: Payer: Self-pay | Admitting: Family Medicine

## 2024-01-24 NOTE — Telephone Encounter (Signed)
 Copied from CRM [id number]. Topic: Clinical - Medication Refill >> Jan 24, 2024 10:31 AM [name] wrote: Medication: albuterol  (VENTOLIN  HFA) 108 (90 Base) MCG/ACT inhaler  Has the patient contacted their pharmacy? Yes- nomore refills (Agent: If no, request that the patient contact the pharmacy for the refill. If patient does not wish to contact the pharmacy document the reason why and proceed with request.) (Agent: If yes, when and what did the pharmacy advise?)  This is the patient's preferred pharmacy:  [id number] [name], [place] - [address] [place] [address] Phone: [phone] Fax: [phone]  Is this the correct pharmacy for this prescription? Yes If no, delete pharmacy and type the correct one.   Has the prescription been filled recently? No  Is the patient out of the medication? Yes  Has the patient been seen for an appointment in the last year OR does the patient have an upcoming appointment? Yes  Can we respond through MyChart? No  Agent: Please be advised that Rx refills may take up to 3 business days. We ask that you follow-up with your pharmacy.

## 2024-02-27 ENCOUNTER — Ambulatory Visit

## 2024-02-27 VITALS — Ht 72.0 in | Wt 188.0 lb

## 2024-02-27 DIAGNOSIS — Z0000 Encounter for general adult medical examination without abnormal findings: Secondary | ICD-10-CM

## 2024-02-27 NOTE — Progress Notes (Signed)
 Subjective:   Omar Singh is a 73 y.o. who presents for a Medicare Wellness preventive visit.  As a reminder, Annual Wellness Visits don't include a physical exam, and some assessments may be limited, especially if this visit is performed virtually. We may recommend an in-person follow-up visit with your provider if needed.  Visit Complete: Virtual I connected with  Omar Singh on 02/27/24 by a audio enabled telemedicine application and verified that I am speaking with the correct person using two identifiers.  Patient Location: Home  Provider Location: Office/Clinic  I discussed the limitations of evaluation and management by telemedicine. The patient expressed understanding and agreed to proceed.  Vital Signs: Because this visit was a virtual/telehealth visit, some criteria may be missing or patient reported. Any vitals not documented were not able to be obtained and vitals that have been documented are patient reported.  VideoDeclined- This patient declined Librarian, academic. Therefore the visit was completed with audio only.  Persons Participating in Visit: Patient.  AWV Questionnaire: No: Patient Medicare AWV questionnaire was not completed prior to this visit.  Cardiac Risk Factors include: advanced age (>66men, >13 women);dyslipidemia;male gender;hypertension     Objective:    Today's Vitals   02/27/24 0806  Weight: 188 lb (85.3 kg)  Height: 6' (1.829 m)   Body mass index is 25.5 kg/m.     02/27/2024    8:09 AM 12/24/2022    6:12 AM 02/04/2019    9:19 AM 01/11/2019    2:50 AM 01/10/2019    3:55 PM 01/06/2019    8:06 AM 12/05/2018    2:07 PM  Advanced Directives  Does Patient Have a Medical Advance Directive? No No No No No No No   Would patient like information on creating a medical advance directive? No - Patient declined Yes (MAU/Ambulatory/Procedural Areas - Information given) No - Patient declined  No - Guardian declined  No -  Patient declined  No - Guardian declined  No - Patient declined      Data saved with a previous flowsheet row definition    Current Medications (verified) Outpatient Encounter Medications as of 02/27/2024  Medication Sig   albuterol  (VENTOLIN  HFA) 108 (90 Base) MCG/ACT inhaler TAKE 2 PUFFS BY MOUTH EVERY 6 HOURS AS NEEDED FOR WHEEZE OR SHORTNESS OF BREATH   amLODipine  (NORVASC ) 10 MG tablet TAKE 1 TABLET BY MOUTH EVERY DAY   aspirin  EC 81 MG tablet Take 1 tablet (81 mg total) by mouth daily. Swallow whole.   atorvastatin  (LIPITOR) 40 MG tablet TAKE 1 TABLET BY MOUTH EVERY DAY   carvedilol  (COREG ) 6.25 MG tablet TAKE 1 TABLET BY MOUTH TWICE A DAY WITH FOOD   No facility-administered encounter medications on file as of 02/27/2024.    Allergies (verified) Patient has no known allergies.   History: Past Medical History:  Diagnosis Date   Acute exacerbation of chronic obstructive pulmonary disease (COPD) (HCC)    /notes 03/23/2015   Alcohol abuse    CAD (coronary artery disease)    a. s/p multi-link vision stent to the mid RCA in 2007 at [facility].   Hyperlipidemia    Hypertension    Myocardial infarction [facility]) 2008   Maximo Spar 03/23/2015   PAD (peripheral artery disease) (HCC)    Panic attack    Tobacco abuse    Past Surgical History:  Procedure Laterality Date   ABDOMINAL AORTOGRAM W/LOWER EXTREMITY N/A 12/24/2022   Procedure: ABDOMINAL AORTOGRAM W/LOWER EXTREMITY;  Surgeon: Shaunna Delaware,  Idolina Maker, MD;  Location: [facility] INVASIVE CV LAB;  Service: Cardiovascular;  Laterality: N/A;   CARDIAC CATHETERIZATION N/A 03/25/2015   Procedure: Left Heart Cath and Coronary Angiography;  Surgeon: Odie Benne, MD;  Location: [facility] INVASIVE CV LAB;  Service: Cardiovascular;  Laterality: N/A;   CARDIAC CATHETERIZATION N/A 03/25/2015   Procedure: Coronary Stent Intervention;  Surgeon: Odie Benne, MD;  Location: [facility] INVASIVE CV LAB;  Service: Cardiovascular;  Laterality: N/A;   CORONARY  ANGIOPLASTY WITH STENT PLACEMENT  2008   1   FEMORAL-TIBIAL BYPASS GRAFT Right 01/08/2019   Procedure: RIGHT FEMORAL Endartarectomy and profundoplasty using greater saphenous vein graft. Right anterior tibial artery bypass with nonreversed saphenous vein graft;  Surgeon: Dannis Dy, MD;  Location: [facility] OR;  Service: Vascular;  Laterality: Right;   LOWER EXTREMITY ANGIOGRAM Right 01/08/2019   Procedure: Lower Extremity Angiogram;  Surgeon: Dannis Dy, MD;  Location: [facility] OR;  Service: Vascular;  Laterality: Right;   LOWER EXTREMITY ANGIOGRAPHY N/A 01/06/2019   Procedure: LOWER EXTREMITY ANGIOGRAPHY;  Surgeon: Margherita Shell, MD;  Location: [facility] INVASIVE CV LAB;  Service: Cardiovascular;  Laterality: N/A;   TONSILLECTOMY     Family History  Problem Relation Age of Onset   CVA Other        Grandparents had strokes   Cancer Neg Hx    Social History   Socioeconomic History   Marital status: Married    Spouse name: Not on file   Number of children: Not on file   Years of education: Not on file   Highest education level: Not on file  Occupational History   Occupation: Retired     Comment: auto-mechanic   Tobacco Use   Smoking status: Former    Current packs/day: 0.00    Average packs/day: 0.3 packs/day for 47.0 years (11.8 ttl pk-yrs)    Types: Cigarettes    Start date: 05/18/1972    Quit date: 05/19/2019    Years since quitting: 4.7   Smokeless tobacco: Never  Vaping Use   Vaping status: Never Used  Substance and Sexual Activity   Alcohol use: Not Currently    Alcohol/week: 56.0 standard drinks of alcohol    Types: 56 Cans of beer per week    Comment: quit 1 year ago    Drug use: No   Sexual activity: Not Currently  Other Topics Concern   Not on file  Social History Narrative   Not on file   Social Drivers of Health   Financial Resource Strain: Low Risk  (02/27/2024)   Overall Financial Resource Strain (CARDIA)    Difficulty of Paying Living Expenses: Not  hard at all  Food Insecurity: No Food Insecurity (02/27/2024)   Hunger Vital Sign    Worried About Running Out of Food in the Last Year: Never true    Ran Out of Food in the Last Year: Never true  Transportation Needs: No Transportation Needs (02/27/2024)   PRAPARE - Administrator, Civil Service (Medical): No    Lack of Transportation (Non-Medical): No  Physical Activity: Inactive (02/27/2024)   Exercise Vital Sign    Days of Exercise per Week: 0 days    Minutes of Exercise per Session: 0 min  Stress: No Stress Concern Present (02/27/2024)   [employer] of Occupational Health - Occupational Stress Questionnaire    Feeling of Stress: Not at all  Social Connections: Moderately Isolated (02/27/2024)   Social Connection and Isolation Panel  Frequency of Communication with Friends and Family: More than three times a week    Frequency of Social Gatherings with Friends and Family: More than three times a week    Attends Religious Services: Never    Database administrator or Organizations: No    Attends Engineer, structural: Never    Marital Status: Married    Tobacco Counseling Counseling given: Not Answered    Clinical Intake:  Pre-visit preparation completed: Yes  Pain : No/denies pain     BMI - recorded: 25.5 Nutritional Status: BMI 25 -29 Overweight Nutritional Risks: None Diabetes: No  Lab Results  Component Value Date   HGBA1C 6.4 11/28/2023   HGBA1C 6.1 09/05/2022   HGBA1C 5.8 (H) 12/06/2018     How often do you need to have someone help you when you read instructions, pamphlets, or other written materials from your doctor or pharmacy?: 1 - Never  Interpreter Needed?: No  Information entered by :: Omar Pilsner, LPN   Activities of Daily Living     02/27/2024    8:07 AM  In your present state of health, do you have any difficulty performing the following activities:  Hearing? 0  Vision? 0  Difficulty concentrating or making  decisions? 0  Walking or climbing stairs? 0  Dressing or bathing? 0  Doing errands, shopping? 0  Preparing Food and eating ? N  Using the Toilet? N  In the past six months, have you accidently leaked urine? N  Do you have problems with loss of bowel control? N  Managing your Medications? N  Managing your Finances? N  Housekeeping or managing your Housekeeping? N    Patient Care Team: Christropher Clamp, MD as PCP - General (Family Medicine) Nickel, Azell Boll, NP (Inactive) as Nurse Practitioner (Vascular Surgery)  I have updated your Care Teams any recent Medical Services you may have received from other providers in the past year.     Assessment:   This is a routine wellness examination for Omar Singh.  Hearing/Vision screen Hearing Screening - Comments:: Pt denies any hearing issues  Vision Screening - Comments:: Wears rx glasses - up to date with [facility] eye exams with [facility]     Goals Addressed             This Visit's Progress    Patient Stated       Maintain health and activity        Depression Screen     02/27/2024    8:09 AM 11/28/2023   10:48 AM 10/29/2022    1:04 PM 09/26/2022    9:58 AM 09/05/2022    9:54 AM 09/24/2019   10:36 AM 04/04/2015    4:33 PM  PHQ 2/9 Scores  PHQ - 2 Score 0 0 0 0 0 0 0    Fall Risk     02/27/2024    8:10 AM 11/28/2023   10:48 AM 10/29/2022    1:05 PM 09/26/2022    9:58 AM 09/05/2022    9:54 AM  Fall Risk   Falls in the past year? 0 0 0 0 0  Number falls in past yr: 0 0 0 0 0  Injury with Fall? 0 0 0 0 0  Risk for fall due to : No Fall Risks No Fall Risks No Fall Risks No Fall Risks No Fall Risks  Follow up Falls prevention discussed        MEDICARE RISK AT HOME:  Medicare Risk at  Home Any stairs in or around the home?: Yes If so, are there any without handrails?: No Home free of loose throw rugs in walkways, pet beds, electrical cords, etc?: Yes Adequate lighting in your home to reduce risk of falls?: Yes Life  alert?: No Use of a cane, walker or w/c?: No Grab bars in the bathroom?: No Shower chair or bench in shower?: No Elevated toilet seat or a handicapped toilet?: No  TIMED UP AND GO:  Was the test performed?  No  Cognitive Function: Completed pt declined to reply to last few questions , However was able to answer appropriately to questions during AWV         02/27/2024    8:11 AM  6CIT Screen  What Year? 0 points  What month? 0 points  What time? 0 points  Count back from 20 0 points  Months in reverse --  Repeat phrase --    Immunizations Immunization History  Administered Date(s) Administered   Fluad Quad(high Dose 65+) 05/24/2019, 09/05/2022   Influenza, High Dose Seasonal PF 08/24/2018   PFIZER(Purple Top)SARS-COV-2 Vaccination 10/31/2019, 11/25/2019   Polio, Unspecified 04/10/1955, 04/24/1955, 04/10/1956, 04/21/1958   Smallpox 05/04/1958   Tetanus 01/05/1958, 04/21/1958, 07/25/1959    Screening Tests Health Maintenance  Topic Date Due   Colonoscopy  Never done   Zoster Vaccines- Shingrix (1 of 2) 02/28/2024 (Originally 03/04/2002)   Pneumococcal Vaccine: 50+ Years (1 of 2 - PCV) 11/27/2024 (Originally 03/05/1971)   Hepatitis C Screening  11/27/2024 (Originally 03[birth year]04/1970)   INFLUENZA VACCINE  04/17/2024   Medicare Annual Wellness (AWV)  02/26/2025   HPV VACCINES  Aged Out   Meningococcal B Vaccine  Aged Out   DTaP/Tdap/Td  Discontinued   COVID-19 Vaccine  Discontinued    Health Maintenance  Health Maintenance Due  Topic Date Due   Colonoscopy  Never done   Health Maintenance Items Addressed: See Nurse Notes at the end of this note  Additional Screening:  Vision Screening: Recommended annual ophthalmology exams for early detection of glaucoma and other disorders of the eye. Would you like a referral to an eye doctor? No    Dental Screening: Recommended annual dental exams for proper oral hygiene  Community Resource Referral / Chronic Care  Management: CRR required this visit?  No   CCM required this visit?  No   Plan:    I have personally reviewed and noted the following in the patient's chart:   Medical and social history Use of alcohol, tobacco or illicit drugs  Current medications and supplements including opioid prescriptions. Patient is not currently taking opioid prescriptions. Functional ability and status Nutritional status Physical activity Advanced directives List of other physicians Hospitalizations, surgeries, and ER visits in previous 12 months Vitals Screenings to include cognitive, depression, and falls Referrals and appointments  In addition, I have reviewed and discussed with patient certain preventive protocols, quality metrics, and best practice recommendations. A written personalized care plan for preventive services as well as general preventive health recommendations were provided to patient.   Omar Capri, LPN   03/16/5283   After Visit Summary: (Declined) Due to this being a telephonic visit, with patients personalized plan was offered to patient but patient Declined AVS at this time   Notes: Please refer to Routing Comments.

## 2024-02-27 NOTE — Patient Instructions (Addendum)
 [name] , Thank you for taking time out of your busy schedule to complete your Annual Wellness Visit with me. I enjoyed our conversation and look forward to speaking with you again next year. I, as well as your care team,  appreciate your ongoing commitment to your health goals. Please review the following plan we discussed and let me know if I can assist you in the future. Your Game plan/ To Do List    Referrals: If you haven't heard from the office you've been referred to, please reach out to them at the phone provided.   Follow up Visits: Next Medicare AWV with our clinical staff: 03/02/25   Have you seen your provider in the last 6 months (3 months if uncontrolled diabetes)? Yes Next Office Visit with your provider: Pt request for Dr Daneil Dunker to call him to discuss next appt   Clinician Recommendations:  Aim for 30 minutes of exercise or brisk walking, 6-8 glasses of water, and 5 servings of fruits and vegetables each day.        This is a list of the screening recommended for you and due dates:  Health Maintenance  Topic Date Due   Colon Cancer Screening  Never done   Zoster (Shingles) Vaccine (1 of 2) 02/28/2024*   Pneumococcal Vaccine for age over 42 (1 of 2 - PCV) 11/27/2024*   Hepatitis C Screening  11/27/2024*   Flu Shot  04/17/2024   Medicare Annual Wellness Visit  02/26/2025   HPV Vaccine  Aged Out   Meningitis B Vaccine  Aged Out   DTaP/Tdap/Td vaccine  Discontinued   COVID-19 Vaccine  Discontinued  *Topic was postponed. The date shown is not the original due date.    Advanced directives: (Declined) Advance directive discussed with you today. Even though you declined this today, please call our office should you change your mind, and we can give you the proper paperwork for you to fill out. Advance Care Planning is important because it:  [x]  Makes sure you receive the medical care that is consistent with your values, goals, and preferences  [x]  It provides guidance to  your family and loved ones and reduces their decisional burden about whether or not they are making the right decisions based on your wishes.  Follow the link provided in your after visit summary or read over the paperwork we have mailed to you to help you started getting your Advance Directives in place. If you need assistance in completing these, please reach out to us  so that we can help you!  See attachments for Preventive Care and Fall Prevention Tips.

## 2024-03-06 ENCOUNTER — Other Ambulatory Visit: Payer: Self-pay | Admitting: Family Medicine

## 2024-03-16 ENCOUNTER — Ambulatory Visit: Payer: Self-pay

## 2024-03-16 NOTE — Telephone Encounter (Signed)
 Copied from CRM [id number]. Topic: Clinical - Red Word Triage >> Mar 16, 2024  1:58 PM Mercedes MATSU wrote: Red Word that prompted transfer to Nurse Triage: Patient called in stating that he was having difficulty breathing, mainly feeling short of breath.    FYI Only or Action Required?: FYI only for provider.  Patient was last seen in primary care on 11/28/2023 by Kennyth Worth HERO, MD. Called Nurse Triage reporting Shortness of Breath. Symptoms began several months ago. Interventions attempted: Nothing. Symptoms are: unchanged.  Triage Disposition: See PCP Within 2 Weeks  Patient/caregiver understands and will follow disposition?: Yes    Reason for Disposition  [1] MILD longstanding difficulty breathing AND [2]  SAME as normal  Answer Assessment - Initial Assessment Questions 1. RESPIRATORY STATUS: Describe your breathing? (e.g., wheezing, shortness of breath, unable to speak, severe coughing)      Shortness of breath  2. ONSET: When did this breathing problem begin?      1 year 3. PATTERN Does the difficult breathing come and go, or has it been constant since it started?      Intermittent  4. SEVERITY: How bad is your breathing? (e.g., mild, moderate, severe)    - MILD: No SOB at rest, mild SOB with walking, speaks normally in sentences, can lie down, no retractions, pulse < 100.    - MODERATE: SOB at rest, SOB with minimal exertion and prefers to sit, cannot lie down flat, speaks in phrases, mild retractions, audible wheezing, pulse 100-120.    - SEVERE: Very SOB at rest, speaks in single words, struggling to breathe, sitting hunched forward, retractions, pulse > 120      No shortness of breath today  5. RECURRENT SYMPTOM: Have you had difficulty breathing before? If Yes, ask: When was the last time? and What happened that time?      Yes, has had it for a year  6. CARDIAC HISTORY: Do you have any history of heart disease? (e.g., heart attack, angina, bypass surgery,  angioplasty)      Yes 7. LUNG HISTORY: Do you have any history of lung disease?  (e.g., pulmonary embolus, asthma, emphysema)     Yes 8. CAUSE: What do you think is causing the breathing problem?      Unsure  9. OTHER SYMPTOMS: Do you have any other symptoms? (e.g., dizziness, runny nose, cough, chest pain, fever)     No  Protocols used: Breathing Difficulty-A-AH

## 2024-03-17 ENCOUNTER — Ambulatory Visit (INDEPENDENT_AMBULATORY_CARE_PROVIDER_SITE_OTHER): Admitting: Family Medicine

## 2024-03-17 ENCOUNTER — Encounter: Payer: Self-pay | Admitting: Family Medicine

## 2024-03-17 VITALS — BP 136/84 | HR 63 | Temp 97.5°F | Ht 72.0 in | Wt 200.4 lb

## 2024-03-17 DIAGNOSIS — I77819 Aortic ectasia, unspecified site: Secondary | ICD-10-CM

## 2024-03-17 DIAGNOSIS — I10 Essential (primary) hypertension: Secondary | ICD-10-CM | POA: Diagnosis not present

## 2024-03-17 DIAGNOSIS — I2510 Atherosclerotic heart disease of native coronary artery without angina pectoris: Secondary | ICD-10-CM | POA: Diagnosis not present

## 2024-03-17 DIAGNOSIS — R0609 Other forms of dyspnea: Secondary | ICD-10-CM

## 2024-03-17 DIAGNOSIS — Z9861 Coronary angioplasty status: Secondary | ICD-10-CM

## 2024-03-17 DIAGNOSIS — I25709 Atherosclerosis of coronary artery bypass graft(s), unspecified, with unspecified angina pectoris: Secondary | ICD-10-CM | POA: Diagnosis not present

## 2024-03-17 DIAGNOSIS — J449 Chronic obstructive pulmonary disease, unspecified: Secondary | ICD-10-CM | POA: Diagnosis not present

## 2024-03-17 MED ORDER — TRELEGY ELLIPTA 100-62.5-25 MCG/ACT IN AEPB: 1.0000 | INHALATION_SPRAY | Freq: Every day | RESPIRATORY_TRACT | 11 refills | Status: DC

## 2024-03-17 MED ORDER — ALBUTEROL SULFATE HFA 108 (90 BASE) MCG/ACT IN AERS: 1.0000 | INHALATION_SPRAY | Freq: Four times a day (QID) | RESPIRATORY_TRACT | 0 refills | Status: AC | PRN

## 2024-03-17 NOTE — Patient Instructions (Signed)
 It was very nice to see you today!  Start the Trelegy 1 puff daily.  Continue albuterol .  We will also have you see the cardiologist.  Please follow-up with me next week.  Go to the emergency room if your symptoms worsen.  No follow-ups on file.   Take care, Dr Kennyth  PLEASE NOTE:  If you had any lab tests, please let us  know if you have not heard back within a few days. You may see your results on mychart before we have a chance to review them but we will give you a call once they are reviewed by us .   If we ordered any referrals today, please let us  know if you have not heard from their office within the next week.   If you had any urgent prescriptions sent in today, please check with the pharmacy within an hour of our visit to make sure the prescription was transmitted appropriately.   Please try these tips to maintain a healthy lifestyle:  Eat at least 3 REAL meals and 1-2 snacks per day.  Aim for no more than 5 hours between eating.  If you eat breakfast, please do so within one hour of getting up.   Each meal should contain half fruits/vegetables, one quarter protein, and one quarter carbs (no bigger than a computer mouse)  Cut down on sweet beverages. This includes juice, soda, and sweet tea.   Drink at least 1 glass of water with each meal and aim for at least 8 glasses per day  Exercise at least 150 minutes every week.

## 2024-03-17 NOTE — Assessment & Plan Note (Signed)
 Likely contributing to his above dyspnea on exertion.  He is having some wheezing on exam today as well.  Will be starting Trelegy as above.  He can continue albuterol .  May need referral to pulmonology depending on response to above.

## 2024-03-17 NOTE — Assessment & Plan Note (Signed)
 At goal today on amlodipine  10 mg daily and Coreg  6.25 mg twice daily.

## 2024-03-17 NOTE — Progress Notes (Signed)
 Omar Singh is a 73 y.o. male who presents today for an office visit.  Assessment/Plan:  New/Acute Problems: Dyspnea on exertion Patient was here for this a few months ago.  Workup at that time included labs and ultimately chest CTA which did not show any acute etiologies.  Incidentally was found to have aortic dilation.  We did recommend cardiology and pulmonary referral at that time however he declined.  We started him on albuterol  however has not had any change in symptoms since our last visit.    We discussed with patient today that his symptoms could be cardiac or pulmonary in etiology.  He does have known coronary artery disease as well as COPD as demonstrated on recent CTA.  His weight is stable today compared to last visit-doubt acute CHF exacerbation and his recent CTA was negative for PE.   He is agreeable to referral to cardiology at this point.  Will place referral today.   He does have quite a bit of wheezing on exam and likely does have COPD playing some role as well. .  Will also start Trelegy for underlying COPD.  He can continue albuterol  as needed.  Depending on response to this as well as his cardiac evaluation may need referral to pulmonology if still having persistent symptoms.  We discussed reasons to return to care and seek emergent care.   Chronic Problems Addressed Today: COPD (chronic obstructive pulmonary disease) (HCC) Likely contributing to his above dyspnea on exertion.  He is having some wheezing on exam today as well.  Will be starting Trelegy as above.  He can continue albuterol .  May need referral to pulmonology depending on response to above.  CAD S/P RCA BMS '08, new RCA BMS 03/25/15 On aspirin  81 mg daily, Lipitor 40 mg daily, and Coreg  6.25 mg twice daily.  Will be having him follow-up with cardiology due to his recent issues with dyspnea on exertion as above.  Essential hypertension At goal today on amlodipine  10 mg daily and Coreg  6.25 mg twice  daily.     Subjective:  HPI:  See assessment / plan for status of chronic conditions.  Patient here today with shortness of breath.  I saw him 3 and half months ago for this.  At that time we checked labs which were notable for elevated D-dimer.  Subsequently performed a CT angiogram which was negative for PULMONARY EMBOLISM however did demonstrate emphysema.  Also noted to have aortic dilation and we recommended cardiology referral.Patient elected to hold off on cardiac and pulmonology evaluation at that time.  He has not had any significant change in symptoms since our last visit.  Does not have shortness of breath at rest however does experience more symptoms with exertion.  No chest pain.  He is not sure if the albuterol  is giving him any benefit.       Objective:  Physical Exam: BP 136/84   Pulse 63   Temp (!) 97.5 F (36.4 C)   Ht 6' (1.829 m)   Wt 200 lb 6.4 oz (90.9 kg)   SpO2 (!) 61%   BMI 27.18 kg/m   Wt Readings from Last 3 Encounters:  03/17/24 200 lb 6.4 oz (90.9 kg)  02/27/24 188 lb (85.3 kg)  11/28/23 200 lb (90.7 kg)    Gen: No acute distress, resting comfortably CV: Regular rate and rhythm with no murmurs appreciated Pulm: Normal work of breathing speaking in full sentences.  Scattered and expiratory wheezes noted in all  lung fields.  No rhonchi. Neuro: Grossly normal, moves all extremities Psych: Normal affect and thought content      Dedra Matsuo M. Kennyth, MD 03/17/2024 11:14 AM

## 2024-03-17 NOTE — Assessment & Plan Note (Signed)
 On aspirin  81 mg daily, Lipitor 40 mg daily, and Coreg  6.25 mg twice daily.  Will be having him follow-up with cardiology due to his recent issues with dyspnea on exertion as above.

## 2024-04-02 ENCOUNTER — Telehealth: Payer: Self-pay | Admitting: *Deleted

## 2024-04-02 NOTE — Telephone Encounter (Signed)
 Copied from CRM [id number]. Topic: Clinical - Medication Question >> Apr 02, 2024 10:53 AM Mia F wrote: Reason for CRM: Pt is asking for the doctor to give him a call in regards to the medication Fluticasone-Umeclidin-Vilant (TRELEGY ELLIPTA ) 100-62.5-25 MCG/ACT AEPB. He states that the doctor wants to know how he is doing on the medication and that the medicine is doing well. He says that the doctor still wants to talk to him so he is asking for a call back  Called patient, patient refused to talk to me  Recommended an appt with PCP refused appointment  [facility]

## 2024-04-08 ENCOUNTER — Telehealth: Payer: Self-pay | Admitting: *Deleted

## 2024-04-08 ENCOUNTER — Other Ambulatory Visit: Payer: Self-pay | Admitting: Family Medicine

## 2024-04-08 MED ORDER — TRELEGY ELLIPTA 100-62.5-25 MCG/ACT IN AEPB: 1.0000 | INHALATION_SPRAY | Freq: Every day | RESPIRATORY_TRACT | 11 refills | Status: AC

## 2024-04-08 NOTE — Telephone Encounter (Signed)
 Copied from CRM [id number]. Topic: Clinical - Medication Refill >> Apr 08, 2024 11:16 AM Drema MATSU wrote: Medication: Fluticasone-Umeclidin-Vilant (TRELEGY ELLIPTA ) 100-62.5-25 MCG/ACT AEPB  Has the patient contacted their pharmacy? no (Agent: If no, request that the patient contact the pharmacy for the refill. If patient does not wish to contact the pharmacy document the reason why and proceed with request.) no refills  (Agent: If yes, when and what did the pharmacy advise?)  This is the patient's preferred pharmacy:  [id number] [place], [place] - [address] [facility] [address] [place] [address] Phone: [phone] Fax: [phone]  Is this the correct pharmacy for this prescription? yes If no, delete pharmacy and type the correct one.   Has the prescription been filled recently? No  Is the patient out of the medication? No 3 days left   Has the patient been seen for an appointment in the last year OR does the patient have an upcoming appointment? Yes  Can we respond through MyChart? No  Agent: Please be advised that Rx refills may take up to 3 business days. We ask that you follow-up with your pharmacy.

## 2024-04-08 NOTE — Telephone Encounter (Signed)
 Copied from CRM [id number]. Topic: Clinical - Medication Question >> Apr 08, 2024 11:18 AM Drema MATSU wrote: Reason for CRM: Patient is requesting a callback from Dr. regarding Trelegy medication  Spoke with patient, stated want to let PCP know antibiotic working  [facility]

## 2024-04-09 NOTE — Telephone Encounter (Signed)
 Looks like he mentioned the inhaler is working well. Recommend he schedule appointment if still having persistent shortness of breath.   Worth HERO. Kennyth, MD 04/09/2024 3:00 PM

## 2024-04-16 ENCOUNTER — Other Ambulatory Visit: Payer: Self-pay

## 2024-04-16 DIAGNOSIS — I739 Peripheral vascular disease, unspecified: Secondary | ICD-10-CM

## 2024-05-14 ENCOUNTER — Ambulatory Visit

## 2024-05-14 ENCOUNTER — Ambulatory Visit (HOSPITAL_COMMUNITY): Admission: RE | Admit: 2024-05-14 | Source: Ambulatory Visit

## 2024-05-30 ENCOUNTER — Other Ambulatory Visit: Payer: Self-pay | Admitting: Family Medicine

## 2024-06-07 ENCOUNTER — Other Ambulatory Visit: Payer: Self-pay | Admitting: Family Medicine

## 2024-06-18 ENCOUNTER — Telehealth: Payer: Self-pay

## 2024-06-18 NOTE — Telephone Encounter (Signed)
 Copied from CRM [id number]. Topic: Clinical - Medication Question >> Jun 18, 2024 12:45 PM Berneda FALCON wrote: Reason for CRM: Patient is requesting  viagra. It is not shown on his med list currently. Is this something he can get?  If so, he would like the following pharmacy: [id number] [place], [place] - [address] AT [facility] [address] [place] [address] Phone: [phone] Fax: [phone] Hours: Not open 24 hours  Patient callback is [phone] (home)

## 2024-06-19 NOTE — Telephone Encounter (Signed)
 I have not prescribed this for him in the past. HE needs an appointment to discuss.  Worth HERO. Kennyth, MD 06/19/2024 7:28 AM

## 2024-06-22 NOTE — Telephone Encounter (Signed)
 Left message to return call to our office at their convenience.

## 2024-06-24 NOTE — Telephone Encounter (Signed)
 LVM to give us  a call to schedule an OV with PCP for medication refills

## 2024-06-26 ENCOUNTER — Ambulatory Visit: Attending: Vascular Surgery | Admitting: Physician Assistant

## 2024-06-26 ENCOUNTER — Ambulatory Visit (HOSPITAL_COMMUNITY)
Admission: RE | Admit: 2024-06-26 | Discharge: 2024-06-26 | Disposition: A | Discharge status: Home / Self Care | Source: Ambulatory Visit | Attending: Vascular Surgery | Admitting: Vascular Surgery

## 2024-06-26 ENCOUNTER — Ambulatory Visit (HOSPITAL_BASED_OUTPATIENT_CLINIC_OR_DEPARTMENT_OTHER)
Admission: RE | Admit: 2024-06-26 | Discharge: 2024-06-26 | Disposition: A | Discharge status: Home / Self Care | Source: Ambulatory Visit | Attending: Vascular Surgery | Admitting: Vascular Surgery

## 2024-06-26 VITALS — BP 91/62 | HR 79 | Temp 98.0°F | Wt 202.8 lb

## 2024-06-26 DIAGNOSIS — I739 Peripheral vascular disease, unspecified: Secondary | ICD-10-CM | POA: Insufficient documentation

## 2024-06-26 LAB — VAS US ABI WITH/WO TBI
Left ABI: 0.64
Right ABI: 0.97

## 2024-06-26 MED ORDER — ASPIRIN 81 MG PO TBEC: 81.0000 mg | DELAYED_RELEASE_TABLET | Freq: Every day | ORAL | 12 refills | Status: AC

## 2024-06-26 MED ORDER — ATORVASTATIN CALCIUM 40 MG PO TABS
40.0000 mg | ORAL_TABLET | Freq: Every day | ORAL | 3 refills | Status: AC
Start: 2024-06-26 — End: ?

## 2024-06-26 NOTE — Progress Notes (Signed)
 Office Note   History of Present Illness   Omar Singh is a 73 y.o. (11/01/1950) male who presents for surveillance of PAD.  He has a history of extensive right iliofemoral endarterectomy with femoral to anterior tibial artery bypass with vein by Dr. Eliza on 01/08/2019.  Most recently he underwent right lower extremity angiography in April 2024 to investigate low velocities within his bypass graft.  Angiogram demonstrated a widely patent bypass graft.  He also has a known history of bilateral lower extremity numbness and swelling, worse on the right.  He returns today for follow-up.  He says that he feels okay at today's follow-up.  He denies any rest pain, claudication, or tissue loss.  He says that his swelling in both lower legs is about the same.  He does not wear his compression stockings or elevate his legs.  He says that he still has numbness in both of his lower legs from the knee to the toes, worse on the right.  This numbness is constant and has no aggravating or alleviating factors.  He denies any pain or weakness.  He also says that he stopped his aspirin  and Lipitor several months ago and does not remember why he chose to stop it.  Current Outpatient Medications  Medication Sig Dispense Refill   albuterol  (VENTOLIN  HFA) 108 (90 Base) MCG/ACT inhaler Inhale 1-2 puffs into the lungs every 6 (six) hours as needed for wheezing or shortness of breath. 18 each 0   amLODipine  (NORVASC ) 10 MG tablet TAKE 1 TABLET BY MOUTH EVERY DAY 90 tablet 3   aspirin  EC 81 MG tablet Take 1 tablet (81 mg total) by mouth daily. Swallow whole. 30 tablet 12   atorvastatin  (LIPITOR) 40 MG tablet Take 1 tablet (40 mg total) by mouth daily. 90 tablet 3   carvedilol  (COREG ) 6.25 MG tablet TAKE 1 TABLET BY MOUTH TWICE A DAY WITH FOOD 180 tablet 1   Fluticasone-Umeclidin-Vilant (TRELEGY ELLIPTA ) 100-62.5-25 MCG/ACT AEPB Inhale 1 puff into the lungs daily. 1 each 11   No current facility-administered  medications for this visit.    REVIEW OF SYSTEMS (negative unless checked):   Cardiac:  []  Chest pain or chest pressure? []  Shortness of breath upon activity? []  Shortness of breath when lying flat? []  Irregular heart rhythm?  Vascular:  []  Pain in calf, thigh, or hip brought on by walking? []  Pain in feet at night that wakes you up from your sleep? []  Blood clot in your veins? []  Leg swelling?  Pulmonary:  []  Oxygen at home? []  Productive cough? []  Wheezing?  Neurologic:  []  Sudden weakness in arms or legs? []  Sudden numbness in arms or legs? []  Sudden onset of difficult speaking or slurred speech? []  Temporary loss of vision in one eye? []  Problems with dizziness?  Gastrointestinal:  []  Blood in stool? []  Vomited blood?  Genitourinary:  []  Burning when urinating? []  Blood in urine?  Psychiatric:  []  Major depression  Hematologic:  []  Bleeding problems? []  Problems with blood clotting?  Dermatologic:  []  Rashes or ulcers?  Constitutional:  []  Fever or chills?  Ear/Nose/Throat:  []  Change in hearing? []  Nose bleeds? []  Sore throat?  Musculoskeletal:  []  Back pain? []  Joint pain? []  Muscle pain?   Physical Examination   Vitals:   06/26/24 0912  BP: 91/62  Pulse: 79  Temp: 98 F (36.7 C)  TempSrc: Temporal  Weight: 202 lb 12.8 oz (92 kg)   Body mass index is  27.5 kg/m.  General:  WDWN in NAD; vital signs documented above Gait: Not observed HENT: WNL, normocephalic Pulmonary: normal non-labored breathing , without rales, rhonchi,  wheezing Cardiac: regular Abdomen: soft, NT, no masses Skin: without rashes Vascular Exam/Pulses: 2+ right DP pulse.  No palpable pedal pulses on the left.  Brisk left DP/PT Doppler signals Extremities: without ischemic changes, without gangrene , without cellulitis; without open wounds;  Musculoskeletal: no muscle wasting or atrophy  Neurologic: A&O X 3;  No focal weakness or paresthesias are  detected Psychiatric:  The pt has Normal affect.  Non-Invasive Vascular imaging   ABI (06/26/2024) R:  ABI: 0.97 (0.97),  PT: mono DP: mono TBI: 0.77 L:  ABI: 0.64 (0.62),  PT: mono DP: mono TBI: 0.34   RLE BPG Duplex (06/26/2024) Right Graft #1: Femoral to ATA Bypass  +------------------+--------+---------------+--------+--------+                   PSV cm/sStenosis       WaveformComments  +------------------+--------+---------------+--------+--------+  Inflow           51                     biphasic          +------------------+--------+---------------+--------+--------+  Prox Anastomosis  66                     biphasic          +------------------+--------+---------------+--------+--------+  Proximal Graft    40                     biphasic          +------------------+--------+---------------+--------+--------+  Mid Graft         40                     biphasic          +------------------+--------+---------------+--------+--------+  Distal Graft      189     50-70% stenosisbiphasicVR 2.4    +------------------+--------+---------------+--------+--------+  Distal Anastomosis176     50-70% stenosisbiphasic          +------------------+--------+---------------+--------+--------+  Outflow          117                    biphasic          +------------------+--------+---------------+--------+--------+    Medical Decision Making   Omar Singh is a 73 y.o. male who presents for surveillance of PAD  Based on the patient's vascular studies, his ABIs are unchanged bilaterally.  His right ABI is 0.97 and left ABI is 0.64. Right lower extremity bypass graft duplex demonstrates a patent bypass with unchanged 50 to 70% stenosis of the distal graft and distal anastomosis.  Velocities within the graft are about the same.  Ultrasound does demonstrate what looks like thrombus in the mid to distal common femoral artery and deep femoral  artery. He denies any rest pain, claudication, or tissue loss.  He does endorse continued bilateral lower extremity numbness from the knee down, which is worse on the right.  This numbness is constant and has no aggravating or alleviating factors.  He has no pain or weakness. On exam he has a palpable right femoral pulse.  He also has a palpable right DP pulse.  He has brisk left DP/PT Doppler signals I have explained to the patient that he does have evidence of possible  thrombus in his common femoral artery proximal to his bypass takeoff.  I have discussed this with Dr. Pearline as well.  Potentially this could be thrombus versus intimal hyperplasia from his previous endart.  Given that he is asymptomatic we do not have to pursue intervention at this time.  We will obtain CTA abdomen/pelvis with runoff and follow-up with Dr. Pearline in 6 months for better visualization of his bypass  Ahmed Holster PA-C [facility] Office: [phone]  Clinic MD: Pearline

## 2024-08-03 ENCOUNTER — Other Ambulatory Visit: Payer: Self-pay | Admitting: Vascular Surgery

## 2024-08-03 DIAGNOSIS — I739 Peripheral vascular disease, unspecified: Secondary | ICD-10-CM

## 2024-12-25 ENCOUNTER — Ambulatory Visit

## 2024-12-25 ENCOUNTER — Other Ambulatory Visit (HOSPITAL_COMMUNITY)

## 2024-12-25 ENCOUNTER — Encounter (HOSPITAL_COMMUNITY)

## 2025-01-01 ENCOUNTER — Ambulatory Visit: Admitting: Vascular Surgery

## 2025-03-02 ENCOUNTER — Ambulatory Visit

## 2025-03-08 ENCOUNTER — Ambulatory Visit
